# Patient Record
Sex: Female | Born: 1965
Health system: Southern US, Community
[De-identification: ages and names within clinical notes are randomized; demographics above are authoritative.]

## PROBLEM LIST (undated history)

## (undated) DIAGNOSIS — K219 Gastro-esophageal reflux disease without esophagitis: Secondary | ICD-10-CM

## (undated) DIAGNOSIS — F410 Panic disorder [episodic paroxysmal anxiety] without agoraphobia: Secondary | ICD-10-CM

## (undated) DIAGNOSIS — T7840XA Allergy, unspecified, initial encounter: Secondary | ICD-10-CM

## (undated) DIAGNOSIS — I10 Essential (primary) hypertension: Secondary | ICD-10-CM

## (undated) DIAGNOSIS — D801 Nonfamilial hypogammaglobulinemia: Secondary | ICD-10-CM

## (undated) DIAGNOSIS — N809 Endometriosis, unspecified: Secondary | ICD-10-CM

## (undated) DIAGNOSIS — E785 Hyperlipidemia, unspecified: Secondary | ICD-10-CM

## (undated) DIAGNOSIS — J329 Chronic sinusitis, unspecified: Secondary | ICD-10-CM

## (undated) DIAGNOSIS — F32A Depression, unspecified: Secondary | ICD-10-CM

## (undated) HISTORY — PX: NASAL SEPTUM SURGERY: SHX37

## (undated) HISTORY — PX: SINUSOTOMY: SHX291

## (undated) HISTORY — PX: KNEE SURGERY: SHX244

## (undated) HISTORY — DX: Depression, unspecified: F32.A

## (undated) HISTORY — PX: BLADDER SURGERY: SHX569

## (undated) HISTORY — DX: Allergy, unspecified, initial encounter: T78.40XA

## (undated) HISTORY — PX: ADENOIDECTOMY: SUR15

## (undated) HISTORY — PX: ABDOMINAL HYSTERECTOMY: SHX81

## (undated) HISTORY — DX: Gastro-esophageal reflux disease without esophagitis: K21.9

## (undated) HISTORY — PX: BREAST LUMPECTOMY: SHX2

## (undated) HISTORY — PX: TONSILLECTOMY: SUR1361

---

## 2006-05-13 ENCOUNTER — Encounter: Admission: RE | Admit: 2006-05-13 | Discharge: 2006-05-13 | Payer: Self-pay | Admitting: Family Medicine

## 2009-12-04 DIAGNOSIS — D801 Nonfamilial hypogammaglobulinemia: Secondary | ICD-10-CM | POA: Insufficient documentation

## 2009-12-04 DIAGNOSIS — E785 Hyperlipidemia, unspecified: Secondary | ICD-10-CM | POA: Insufficient documentation

## 2009-12-04 DIAGNOSIS — R5383 Other fatigue: Secondary | ICD-10-CM | POA: Insufficient documentation

## 2010-10-09 ENCOUNTER — Other Ambulatory Visit: Payer: Self-pay | Admitting: Family Medicine

## 2010-10-09 ENCOUNTER — Ambulatory Visit
Admission: RE | Admit: 2010-10-09 | Discharge: 2010-10-09 | Disposition: A | Discharge status: Home / Self Care | Source: Ambulatory Visit | Attending: Family Medicine | Admitting: Family Medicine

## 2010-10-09 DIAGNOSIS — T1490XA Injury, unspecified, initial encounter: Secondary | ICD-10-CM

## 2011-01-09 ENCOUNTER — Other Ambulatory Visit: Payer: Self-pay | Admitting: Family Medicine

## 2011-01-09 DIAGNOSIS — N6320 Unspecified lump in the left breast, unspecified quadrant: Secondary | ICD-10-CM

## 2011-01-17 ENCOUNTER — Encounter

## 2011-01-17 ENCOUNTER — Ambulatory Visit
Admission: RE | Admit: 2011-01-17 | Discharge: 2011-01-17 | Disposition: A | Discharge status: Home / Self Care | Source: Ambulatory Visit | Attending: Family Medicine | Admitting: Family Medicine

## 2011-01-17 ENCOUNTER — Other Ambulatory Visit: Payer: Self-pay | Admitting: Family Medicine

## 2011-01-17 DIAGNOSIS — N6320 Unspecified lump in the left breast, unspecified quadrant: Secondary | ICD-10-CM

## 2011-01-22 ENCOUNTER — Telehealth: Payer: Self-pay | Admitting: Family

## 2011-01-22 NOTE — Telephone Encounter (Signed)
called pt lmovm to rtn call to schedule appt for 02/03/2011

## 2011-01-24 ENCOUNTER — Other Ambulatory Visit: Payer: Self-pay | Admitting: Family Medicine

## 2011-01-24 ENCOUNTER — Telehealth: Payer: Self-pay | Admitting: Family

## 2011-01-24 DIAGNOSIS — Z1501 Genetic susceptibility to malignant neoplasm of breast: Secondary | ICD-10-CM

## 2011-01-24 DIAGNOSIS — Z1509 Genetic susceptibility to other malignant neoplasm: Secondary | ICD-10-CM

## 2011-01-24 DIAGNOSIS — Z803 Family history of malignant neoplasm of breast: Secondary | ICD-10-CM

## 2011-01-24 NOTE — Telephone Encounter (Signed)
called pt lmovm to rtn call to confirm appt in [id number]

## 2011-01-27 ENCOUNTER — Encounter

## 2011-01-27 ENCOUNTER — Other Ambulatory Visit

## 2011-01-28 ENCOUNTER — Telehealth: Payer: Self-pay | Admitting: Oncology

## 2011-01-28 NOTE — Telephone Encounter (Signed)
no response from pt will rtn chart to Misty Stanley to infom her that pt did not rtn call to scheduled appt

## 2011-02-03 ENCOUNTER — Encounter: Admitting: Family

## 2011-02-03 ENCOUNTER — Ambulatory Visit

## 2011-02-24 ENCOUNTER — Ambulatory Visit

## 2011-02-24 ENCOUNTER — Encounter: Admitting: Family

## 2011-02-27 ENCOUNTER — Telehealth: Payer: Self-pay | Admitting: *Deleted

## 2011-02-27 NOTE — Telephone Encounter (Signed)
Called pt to see if she wanted to get rescheduled for [facility] and she states that her insurance [employer] does not participate with [place] and she does not want to schedule an appt.

## 2011-03-11 DIAGNOSIS — Z1501 Genetic susceptibility to malignant neoplasm of breast: Secondary | ICD-10-CM | POA: Insufficient documentation

## 2011-04-17 ENCOUNTER — Encounter (INDEPENDENT_AMBULATORY_CARE_PROVIDER_SITE_OTHER): Payer: Self-pay

## 2012-11-08 DIAGNOSIS — R609 Edema, unspecified: Secondary | ICD-10-CM | POA: Insufficient documentation

## 2012-12-02 DIAGNOSIS — F17200 Nicotine dependence, unspecified, uncomplicated: Secondary | ICD-10-CM | POA: Insufficient documentation

## 2013-01-27 DIAGNOSIS — M791 Myalgia, unspecified site: Secondary | ICD-10-CM | POA: Insufficient documentation

## 2015-02-08 DIAGNOSIS — G609 Hereditary and idiopathic neuropathy, unspecified: Secondary | ICD-10-CM | POA: Insufficient documentation

## 2015-02-08 DIAGNOSIS — K219 Gastro-esophageal reflux disease without esophagitis: Secondary | ICD-10-CM | POA: Insufficient documentation

## 2015-02-08 DIAGNOSIS — F3341 Major depressive disorder, recurrent, in partial remission: Secondary | ICD-10-CM | POA: Insufficient documentation

## 2015-06-26 DIAGNOSIS — I10 Essential (primary) hypertension: Secondary | ICD-10-CM | POA: Insufficient documentation

## 2016-07-11 DIAGNOSIS — K219 Gastro-esophageal reflux disease without esophagitis: Secondary | ICD-10-CM | POA: Insufficient documentation

## 2016-07-11 DIAGNOSIS — K2970 Gastritis, unspecified, without bleeding: Secondary | ICD-10-CM | POA: Insufficient documentation

## 2016-07-11 DIAGNOSIS — F32A Depression, unspecified: Secondary | ICD-10-CM | POA: Insufficient documentation

## 2016-07-11 DIAGNOSIS — F411 Generalized anxiety disorder: Secondary | ICD-10-CM | POA: Insufficient documentation

## 2016-07-11 DIAGNOSIS — F329 Major depressive disorder, single episode, unspecified: Secondary | ICD-10-CM | POA: Insufficient documentation

## 2016-07-11 DIAGNOSIS — J301 Allergic rhinitis due to pollen: Secondary | ICD-10-CM | POA: Insufficient documentation

## 2016-07-11 DIAGNOSIS — E785 Hyperlipidemia, unspecified: Secondary | ICD-10-CM | POA: Insufficient documentation

## 2016-07-11 HISTORY — DX: Gastritis, unspecified, without bleeding: K29.70

## 2016-09-23 DIAGNOSIS — K2270 Barrett's esophagus without dysplasia: Secondary | ICD-10-CM | POA: Insufficient documentation

## 2016-09-23 DIAGNOSIS — H539 Unspecified visual disturbance: Secondary | ICD-10-CM | POA: Insufficient documentation

## 2016-09-23 DIAGNOSIS — G4700 Insomnia, unspecified: Secondary | ICD-10-CM | POA: Insufficient documentation

## 2016-09-23 DIAGNOSIS — G629 Polyneuropathy, unspecified: Secondary | ICD-10-CM | POA: Insufficient documentation

## 2016-10-06 DIAGNOSIS — D696 Thrombocytopenia, unspecified: Secondary | ICD-10-CM | POA: Insufficient documentation

## 2016-10-06 DIAGNOSIS — R0683 Snoring: Secondary | ICD-10-CM | POA: Insufficient documentation

## 2016-10-06 DIAGNOSIS — J329 Chronic sinusitis, unspecified: Secondary | ICD-10-CM | POA: Insufficient documentation

## 2016-10-06 DIAGNOSIS — Z8619 Personal history of other infectious and parasitic diseases: Secondary | ICD-10-CM | POA: Insufficient documentation

## 2016-10-09 DIAGNOSIS — R7989 Other specified abnormal findings of blood chemistry: Secondary | ICD-10-CM

## 2016-10-09 HISTORY — DX: Other specified abnormal findings of blood chemistry: R79.89

## 2018-02-05 DIAGNOSIS — F419 Anxiety disorder, unspecified: Secondary | ICD-10-CM | POA: Insufficient documentation

## 2018-02-05 DIAGNOSIS — F410 Panic disorder [episodic paroxysmal anxiety] without agoraphobia: Secondary | ICD-10-CM | POA: Insufficient documentation

## 2018-02-05 DIAGNOSIS — F411 Generalized anxiety disorder: Secondary | ICD-10-CM | POA: Insufficient documentation

## 2018-02-09 DIAGNOSIS — K1120 Sialoadenitis, unspecified: Secondary | ICD-10-CM | POA: Insufficient documentation

## 2018-02-09 HISTORY — DX: Sialoadenitis, unspecified: K11.20

## 2018-08-16 MED FILL — SERTRALINE HCL 100 MG TAB: 100 | 30 days supply | Qty: 60 | Fill #0

## 2018-09-09 ENCOUNTER — Ambulatory Visit
Admission: EM | Admit: 2018-09-09 | Discharge: 2018-09-09 | Disposition: A | Discharge status: Home / Self Care | Payer: No Typology Code available for payment source | Attending: Emergency Medicine | Admitting: Emergency Medicine

## 2018-09-09 ENCOUNTER — Encounter: Payer: Self-pay | Admitting: Emergency Medicine

## 2018-09-09 ENCOUNTER — Other Ambulatory Visit: Payer: Self-pay

## 2018-09-09 DIAGNOSIS — H9201 Otalgia, right ear: Secondary | ICD-10-CM | POA: Diagnosis not present

## 2018-09-09 DIAGNOSIS — R05 Cough: Secondary | ICD-10-CM

## 2018-09-09 DIAGNOSIS — Z20822 Contact with and (suspected) exposure to covid-19: Secondary | ICD-10-CM

## 2018-09-09 DIAGNOSIS — H6121 Impacted cerumen, right ear: Secondary | ICD-10-CM

## 2018-09-09 DIAGNOSIS — R5383 Other fatigue: Secondary | ICD-10-CM | POA: Diagnosis not present

## 2018-09-09 DIAGNOSIS — J0101 Acute recurrent maxillary sinusitis: Secondary | ICD-10-CM

## 2018-09-09 DIAGNOSIS — Z20828 Contact with and (suspected) exposure to other viral communicable diseases: Secondary | ICD-10-CM

## 2018-09-09 DIAGNOSIS — R509 Fever, unspecified: Secondary | ICD-10-CM

## 2018-09-09 HISTORY — DX: Chronic sinusitis, unspecified: J32.9

## 2018-09-09 HISTORY — DX: Hyperlipidemia, unspecified: E78.5

## 2018-09-09 HISTORY — DX: Essential (primary) hypertension: I10

## 2018-09-09 HISTORY — DX: Endometriosis, unspecified: N80.9

## 2018-09-09 HISTORY — DX: Panic disorder (episodic paroxysmal anxiety): F41.0

## 2018-09-09 HISTORY — DX: Nonfamilial hypogammaglobulinemia: D80.1

## 2018-09-09 MED ORDER — DOXYCYCLINE HYCLATE 100 MG PO CAPS: 100.0000 mg | ORAL_CAPSULE | Freq: Two times a day (BID) | ORAL | 0 refills | Status: DC

## 2018-09-09 NOTE — Discharge Instructions (Addendum)
Right ear lavage performed in office  COVID testing ordered.  It will take 5-7 days for test results to return.  We will follow up with you regarding abnormal results  In the meantime: You should remain isolated in your home for 10 days from symptom onset AND greater than 72 hours after symptoms resolution (absence of fever without the use of fever-reducing medication and improvement in respiratory symptoms), whichever is longer Get plenty of rest and push fluids Based on symptoms will go ahead and treat for possible sinus infection.  Augmentin prescribed (paper prescription given, fill in 1-2 days if symptoms do not improve).  Take as directed and to completion Use OTC zyrtec for nasal congestion, runny nose, and/or sore throat Use OTC flonase for nasal congestion and runny nose Use medications daily for symptom relief Use OTC medications like ibuprofen or tylenol as needed fever or pain Call or go to the ED if you have any new or worsening symptoms such as fever, worsening cough, shortness of breath, chest tightness, chest pain, turning blue, changes in mental status, etc..Marland Kitchen

## 2018-09-09 NOTE — ED Triage Notes (Signed)
Pt presents with complaints of sinus drainage, right earache and chronic cough. Pt states that she has hx of chronic sinus issues and is not concerned for covid.

## 2018-09-09 NOTE — ED Provider Notes (Signed)
[place]   [id number] 09/09/18 Arrival Time: 7001   CC: Sinus drainage   SUBJECTIVE: History from: patient.  Pamela Waller is a 53 y.o. female who presents with acute on chronic sinus congestion/ pain as well as RT earache that began 3 days ago. Denies sick exposure to COVID, flu or strep.  Denies recent travel.   Does work at [employer], but denies positive COVID exposure.  Has tried OTC medications without relief.  Symptoms are made worse with leaning forward.  Reports previous symptoms in the past with chronic sinus issues. Reports past psuedomonas nasal infection and required IV antibiotics.  Complains of associated fatigue, temp of 99, PND, decreased hearing, mild productive cough, and improving vertigo.  Denies fever, chills, rhinorrhea, sore throat, SOB, wheezing, chest pain, nausea, changes in bowel or bladder habits.    ROS: As per HPI.  All other pertinent ROS negative.     Past Medical History:  Diagnosis Date  . Endometriosis   . Hyperlipidemia   . Hypertension   . Hypogammaglobulinemia ([place])    sub class 1 and 3 deficiency  . Panic attacks   . Sinusitis    Past Surgical History:  Procedure Laterality Date  . ABDOMINAL HYSTERECTOMY    . BLADDER SURGERY    . BREAST LUMPECTOMY    . CESAREAN SECTION    . KNEE SURGERY    . NASAL SEPTUM SURGERY    . SINUSOTOMY     Allergies  Allergen Reactions  . Dilaudid [Hydromorphone Hcl] Anaphylaxis  . Morphine And Related Anaphylaxis  . Codeine   . Penicillins   . Prednisone     psychosis  . Prozac [Fluoxetine Hcl]    No current facility-administered medications on file prior to encounter.    Current Outpatient Medications on File Prior to Encounter  Medication Sig Dispense Refill  . aspirin 81 MG chewable tablet Chew by mouth daily.    . cholecalciferol (VITAMIN D3) 25 MCG (1000 UT) tablet Take 2,000 Units by mouth daily.    . clonazePAM (KLONOPIN) 0.5 MG tablet Take 0.5 mg by mouth 3 (three) times  daily as needed for anxiety.    . rosuvastatin (CRESTOR) 20 MG tablet Take 20 mg by mouth daily.    . sertraline (ZOLOFT) 100 MG tablet Take 200 mg by mouth daily.     Social History   Socioeconomic History  . Marital status: Widowed    Spouse name: Not on file  . Number of children: Not on file  . Years of education: Not on file  . Highest education level: Not on file  Occupational History  . Not on file  Social Needs  . Financial resource strain: Not on file  . Food insecurity    Worry: Not on file    Inability: Not on file  . Transportation needs    Medical: Not on file    Non-medical: Not on file  Tobacco Use  . Smoking status: Current Every Day Smoker    Packs/day: 0.50  . Smokeless tobacco: Never Used  Substance and Sexual Activity  . Alcohol use: Never    Frequency: Never  . Drug use: Never  . Sexual activity: Not on file  Lifestyle  . Physical activity    Days per week: Not on file    Minutes per session: Not on file  . Stress: Not on file  Relationships  . Social Herbalist on phone: Not on file    Gets  together: Not on file    Attends religious service: Not on file    Active member of club or organization: Not on file    Attends meetings of clubs or organizations: Not on file    Relationship status: Not on file  . Intimate partner violence    Fear of current or ex partner: Not on file    Emotionally abused: Not on file    Physically abused: Not on file    Forced sexual activity: Not on file  Other Topics Concern  . Not on file  Social History Narrative  . Not on file   Family History  Problem Relation Age of Onset  . Asthma Mother   . Arthritis Mother   . Pulmonary fibrosis Father   . Hyperlipidemia Father   . Hypertension Father     OBJECTIVE:  Vitals:   09/09/18 1304  BP: 120/81  Pulse: 90  Resp: 18  Temp: 98.3 F (36.8 C)  SpO2: 95%     General appearance: alert; appears mildly fatigued, but nontoxic; speaking in full  sentences and tolerating own secretions HEENT: NCAT; Ears: LT EAC clear, LT TM pearly gray, RT EAC with cerumen impaction, TM not visualized; Eyes: PERRL.  EOM grossly intact. Sinuses: TTP over RT maxillary sinus; Nose: nares patent without rhinorrhea, Throat: oropharynx clear, tonsils non erythematous or enlarged, uvula midline  Neck: supple without LAD Lungs: unlabored respirations, symmetrical air entry; cough: absent; no respiratory distress; CTAB Heart: regular rate and rhythm.  Radial pulses 2+ symmetrical bilaterally Skin: warm and dry Psychological: alert and cooperative; normal mood and affect  PROCEDURE:  Consent granted.  Right ear lavage performed by Monna Fam RT.  TM visualized.  PT tolerated procedure well.    ASSESSMENT & PLAN:  1. Suspected Covid-19 Virus Infection   2. Impacted cerumen of right ear   3. Acute otalgia, right   4. Acute recurrent maxillary sinusitis     Meds ordered this encounter  Medications  . DISCONTD: doxycycline (VIBRAMYCIN) 100 MG capsule    Sig: Take 1 capsule (100 mg total) by mouth 2 (two) times daily.    Dispense:  20 capsule    Refill:  0    Order Specific Question:   Supervising Provider    Answer:   Raylene Everts [[id number]]  . DISCONTD: doxycycline (VIBRAMYCIN) 100 MG capsule    Sig: Take 1 capsule (100 mg total) by mouth 2 (two) times daily.    Dispense:  20 capsule    Refill:  0    Order Specific Question:   Supervising Provider    Answer:   Raylene Everts [[id number]]  . DISCONTD: doxycycline (VIBRAMYCIN) 100 MG capsule    Sig: Take 1 capsule (100 mg total) by mouth 2 (two) times daily.    Dispense:  20 capsule    Refill:  0    Order Specific Question:   Supervising Provider    Answer:   Raylene Everts [[id number]]  . doxycycline (VIBRAMYCIN) 100 MG capsule    Sig: Take 1 capsule (100 mg total) by mouth 2 (two) times daily.    Dispense:  20 capsule    Refill:  0    Order Specific Question:   Supervising Provider     Answer:   Raylene Everts [[id number]]   Right ear lavage performed in office  COVID testing ordered.  It will take 5-7 days for test results to return.  We will follow up with you regarding abnormal  results  In the meantime: You should remain isolated in your home for 10 days from symptom onset AND greater than 72 hours after symptoms resolution (absence of fever without the use of fever-reducing medication and improvement in respiratory symptoms), whichever is longer Get plenty of rest and push fluids Based on symptoms will go ahead and treat for possible sinus infection.  Doxycycline prescribed.  Take as directed and to completion Use OTC zyrtec for nasal congestion, runny nose, and/or sore throat Use OTC flonase for nasal congestion and runny nose Use medications daily for symptom relief Use OTC medications like ibuprofen or tylenol as needed fever or pain Call or go to the ED if you have any new or worsening symptoms such as fever, worsening cough, shortness of breath, chest tightness, chest pain, turning blue, changes in mental status, etc...    Reviewed expectations re: course of current medical issues. Questions answered. Outlined signs and symptoms indicating need for more acute intervention. Patient verbalized understanding. After Visit Summary given.         Lestine Box, PA-C 09/09/18 1349

## 2018-09-10 LAB — NOVEL CORONAVIRUS, NAA: SARS-CoV-2, NAA: NOT DETECTED

## 2018-09-13 ENCOUNTER — Telehealth (HOSPITAL_COMMUNITY): Payer: Self-pay | Admitting: Emergency Medicine

## 2018-09-13 MED FILL — SERTRALINE HCL 100 MG TAB: 100 | 30 days supply | Qty: 60 | Fill #1

## 2018-09-13 NOTE — Telephone Encounter (Signed)
Patient contacted and made aware of  covid  results, all questions answered

## 2018-09-14 MED FILL — clonazePAM 0.5 MG TABS: 0.5 | 30 days supply | Qty: 90 | Fill #0

## 2018-09-30 ENCOUNTER — Ambulatory Visit (INDEPENDENT_AMBULATORY_CARE_PROVIDER_SITE_OTHER): Admitting: Internal Medicine

## 2018-10-11 MED FILL — SERTRALINE HCL 100 MG TAB: 100 | 90 days supply | Qty: 180 | Fill #0

## 2018-10-27 MED FILL — clonazePAM 0.5 MG TABS: 0.5 | 30 days supply | Qty: 90 | Fill #1

## 2018-12-02 ENCOUNTER — Telehealth: Payer: Self-pay | Admitting: Emergency Medicine

## 2018-12-02 ENCOUNTER — Encounter: Payer: Self-pay | Admitting: Internal Medicine

## 2018-12-02 NOTE — Telephone Encounter (Signed)
Ok with me 

## 2018-12-02 NOTE — Telephone Encounter (Signed)
Pt is wanting to know if she can establish care with you. She needs an MD and lots of referrals. Are you willing to except her?

## 2018-12-06 NOTE — Telephone Encounter (Signed)
Left pt vm to call back to schedule appt

## 2018-12-10 MED FILL — clonazePAM 0.5 MG TABS: 0.5 | 30 days supply | Qty: 90 | Fill #2

## 2018-12-20 ENCOUNTER — Ambulatory Visit: Payer: No Typology Code available for payment source | Admitting: Internal Medicine

## 2018-12-23 ENCOUNTER — Ambulatory Visit (INDEPENDENT_AMBULATORY_CARE_PROVIDER_SITE_OTHER): Payer: No Typology Code available for payment source | Admitting: Internal Medicine

## 2018-12-23 ENCOUNTER — Other Ambulatory Visit (INDEPENDENT_AMBULATORY_CARE_PROVIDER_SITE_OTHER): Payer: No Typology Code available for payment source

## 2018-12-23 ENCOUNTER — Encounter: Payer: Self-pay | Admitting: Internal Medicine

## 2018-12-23 ENCOUNTER — Other Ambulatory Visit: Payer: Self-pay

## 2018-12-23 VITALS — BP 132/86 | HR 72 | Temp 97.8°F | Wt 115.0 lb

## 2018-12-23 DIAGNOSIS — D801 Nonfamilial hypogammaglobulinemia: Secondary | ICD-10-CM

## 2018-12-23 DIAGNOSIS — R739 Hyperglycemia, unspecified: Secondary | ICD-10-CM

## 2018-12-23 DIAGNOSIS — E538 Deficiency of other specified B group vitamins: Secondary | ICD-10-CM | POA: Diagnosis not present

## 2018-12-23 DIAGNOSIS — K219 Gastro-esophageal reflux disease without esophagitis: Secondary | ICD-10-CM

## 2018-12-23 DIAGNOSIS — E559 Vitamin D deficiency, unspecified: Secondary | ICD-10-CM

## 2018-12-23 DIAGNOSIS — Z0001 Encounter for general adult medical examination with abnormal findings: Secondary | ICD-10-CM

## 2018-12-23 DIAGNOSIS — I10 Essential (primary) hypertension: Secondary | ICD-10-CM

## 2018-12-23 DIAGNOSIS — Z01419 Encounter for gynecological examination (general) (routine) without abnormal findings: Secondary | ICD-10-CM | POA: Insufficient documentation

## 2018-12-23 DIAGNOSIS — J329 Chronic sinusitis, unspecified: Secondary | ICD-10-CM

## 2018-12-23 DIAGNOSIS — E611 Iron deficiency: Secondary | ICD-10-CM

## 2018-12-23 DIAGNOSIS — J309 Allergic rhinitis, unspecified: Secondary | ICD-10-CM | POA: Insufficient documentation

## 2018-12-23 DIAGNOSIS — E785 Hyperlipidemia, unspecified: Secondary | ICD-10-CM

## 2018-12-23 DIAGNOSIS — G4710 Hypersomnia, unspecified: Secondary | ICD-10-CM

## 2018-12-23 DIAGNOSIS — F410 Panic disorder [episodic paroxysmal anxiety] without agoraphobia: Secondary | ICD-10-CM | POA: Insufficient documentation

## 2018-12-23 HISTORY — DX: Chronic sinusitis, unspecified: J32.9

## 2018-12-23 HISTORY — DX: Deficiency of other specified B group vitamins: E53.8

## 2018-12-23 HISTORY — DX: Vitamin D deficiency, unspecified: E55.9

## 2018-12-23 LAB — CBC WITH DIFFERENTIAL/PLATELET
Basophils Absolute: 0.1 10*3/uL (ref 0.0–0.1)
Basophils Relative: 1.1 % (ref 0.0–3.0)
Eosinophils Absolute: 0.3 10*3/uL (ref 0.0–0.7)
Eosinophils Relative: 4.5 % (ref 0.0–5.0)
HCT: 41.2 % (ref 36.0–46.0)
Hemoglobin: 13.7 g/dL (ref 12.0–15.0)
Lymphocytes Relative: 32.2 % (ref 12.0–46.0)
Lymphs Abs: 2.1 10*3/uL (ref 0.7–4.0)
MCHC: 33.4 g/dL (ref 30.0–36.0)
MCV: 90.1 fl (ref 78.0–100.0)
Monocytes Absolute: 0.4 10*3/uL (ref 0.1–1.0)
Monocytes Relative: 6.7 % (ref 3.0–12.0)
Neutro Abs: 3.6 10*3/uL (ref 1.4–7.7)
Neutrophils Relative %: 55.5 % (ref 43.0–77.0)
Platelets: 111 10*3/uL — ABNORMAL LOW (ref 150.0–400.0)
RBC: 4.57 Mil/uL (ref 3.87–5.11)
RDW: 13.2 % (ref 11.5–15.5)
WBC: 6.4 10*3/uL (ref 4.0–10.5)

## 2018-12-23 LAB — URINALYSIS, ROUTINE W REFLEX MICROSCOPIC
Bilirubin Urine: NEGATIVE
Ketones, ur: NEGATIVE
Leukocytes,Ua: NEGATIVE
Nitrite: NEGATIVE
Specific Gravity, Urine: 1.025 (ref 1.000–1.030)
Total Protein, Urine: NEGATIVE
Urine Glucose: NEGATIVE
Urobilinogen, UA: 0.2 (ref 0.0–1.0)
pH: 6 (ref 5.0–8.0)

## 2018-12-23 LAB — HEMOGLOBIN A1C: Hgb A1c MFr Bld: 5.1 % (ref 4.6–6.5)

## 2018-12-23 LAB — BASIC METABOLIC PANEL
BUN: 14 mg/dL (ref 6–23)
CO2: 28 mEq/L (ref 19–32)
Calcium: 9.8 mg/dL (ref 8.4–10.5)
Chloride: 106 mEq/L (ref 96–112)
Creatinine, Ser: 0.75 mg/dL (ref 0.40–1.20)
GFR: 80.68 mL/min (ref >60.00)
Glucose, Bld: 83 mg/dL (ref 70–99)
Potassium: 3.9 mEq/L (ref 3.5–5.1)
Sodium: 141 mEq/L (ref 135–145)

## 2018-12-23 LAB — IBC PANEL
Iron: 79 ug/dL (ref 42–145)
Saturation Ratios: 20.8 % (ref 20.0–50.0)
Transferrin: 271 mg/dL (ref 212.0–360.0)

## 2018-12-23 LAB — LIPID PANEL
Cholesterol: 186 mg/dL (ref 0–200)
HDL: 43 mg/dL (ref >39.00)
LDL Cholesterol: 105 mg/dL — ABNORMAL HIGH (ref 0–99)
NonHDL: 142.67
Total CHOL/HDL Ratio: 4
Triglycerides: 186 mg/dL — ABNORMAL HIGH (ref 0.0–149.0)
VLDL: 37.2 mg/dL (ref 0.0–40.0)

## 2018-12-23 LAB — HEPATIC FUNCTION PANEL
ALT: 13 U/L (ref 0–35)
AST: 17 U/L (ref 0–37)
Albumin: 4.7 g/dL (ref 3.5–5.2)
Alkaline Phosphatase: 76 U/L (ref 39–117)
Bilirubin, Direct: 0.1 mg/dL (ref 0.0–0.3)
Total Bilirubin: 0.5 mg/dL (ref 0.2–1.2)
Total Protein: 6.9 g/dL (ref 6.0–8.3)

## 2018-12-23 LAB — TSH: TSH: 2.79 u[IU]/mL (ref 0.35–4.50)

## 2018-12-23 LAB — VITAMIN B12: Vitamin B-12: 180 pg/mL — ABNORMAL LOW (ref 211–911)

## 2018-12-23 LAB — VITAMIN D 25 HYDROXY (VIT D DEFICIENCY, FRACTURES): VITD: 33.07 ng/mL (ref 30.00–100.00)

## 2018-12-23 MED ORDER — VITAMIN B-12 1000 MCG PO TABS: 1000.0000 ug | ORAL_TABLET | Freq: Every day | ORAL | 3 refills | Status: DC

## 2018-12-23 MED ORDER — PANTOPRAZOLE SODIUM 40 MG PO TBEC: 40.0000 mg | DELAYED_RELEASE_TABLET | Freq: Every day | ORAL | 3 refills | Status: DC

## 2018-12-23 MED ORDER — PROPRANOLOL HCL ER 60 MG PO CP24: 60.0000 mg | ORAL_CAPSULE | Freq: Every day | ORAL | 3 refills | Status: DC

## 2018-12-23 MED ORDER — HYDROCHLOROTHIAZIDE 12.5 MG PO CAPS: 12.5000 mg | ORAL_CAPSULE | Freq: Every day | ORAL | 3 refills | Status: DC

## 2018-12-23 MED FILL — PANTOPRAZOLE SOD DR 40 MG T: 40 | 90 days supply | Qty: 90 | Fill #0

## 2018-12-23 MED FILL — HYDROCHLOROTHIAZIDE 12.5 MG: 12.5 | 90 days supply | Qty: 90 | Fill #0

## 2018-12-23 MED FILL — PROPRANOLOL HCL ER 60 MG CP: 60 | 90 days supply | Qty: 90 | Fill #0

## 2018-12-23 NOTE — Patient Instructions (Signed)
Please take all new medication as prescribed - the protonix   Please continue all other medications as before, including the restart of the inderal and HCT  Please have the pharmacy call with any other refills you may need.  Please continue your efforts at being more active, low cholesterol diet, and weight control.  You are otherwise up to date with prevention measures today.  Please keep your appointments with your specialists as you may have planned  You will be contacted regarding the referral for: ENT, GI, Pulmonary, and Infectious Disease  Please go to the LAB in the Basement (turn left off the elevator) for the tests to be done today  You will be contacted by phone if any changes need to be made immediately.  Otherwise, you will receive a letter about your results with an explanation, but please check with MyChart first.  Please remember to sign up for MyChart if you have not done so, as this will be important to you in the future with finding out test results, communicating by private email, and scheduling acute appointments online when needed.  Please return in 1 year for your yearly visit, or sooner if needed

## 2018-12-23 NOTE — Progress Notes (Signed)
Subjective:    Patient ID: Pamela Waller, female    DOB: Feb 22, 1965, 53 y.o.   MRN: [id number]  HPI Here for wellness and f/u;  Overall doing ok;  Pt denies Chest pain, worsening SOB, DOE, wheezing, orthopnea, PND, worsening LE edema, palpitations, dizziness or syncope.  Pt denies neurological change such as new headache, facial or extremity weakness.  Pt denies polydipsia, polyuria, or low sugar symptoms. Pt states overall good compliance with treatment and medications, good tolerability, and has been trying to follow appropriate diet.  Pt denies worsening depressive symptoms, suicidal ideation or panic. No fever, night sweats, wt loss, loss of appetite, or other constitutional symptoms.  Pt states good ability with ADL's, has low fall risk, home safety reviewed and adequate, no other significant changes in hearing or vision, and occasionally active with exercise.  Here with 2-3 yrs chronic onset intermittent fever, facial pain, pressure, headache, general weakness and malaise, and greenish d/c, with mild ST and cough.  Has been a recurrent problem requiring ENT in past.  Also, Has had mild to mod worsening reflux, but no abd pain, dysphagia, n/v, bowel change or blood, asks for referral GI.  Also with c/o daytime somnolence most days.  Also with hx of low b12, low vit d, and hypogammaglobulinemia needing referral as she has just moved her for new job.  Also needs restart meds for HTN now run out.   Past Medical History:  Diagnosis Date  . Chronic sinusitis 12/23/2018  . Endometriosis   . Hyperlipidemia   . Hypertension   . Hypogammaglobulinemia ([place])    sub class 1 and 3 deficiency  . Panic attacks   . Sinusitis   . Vitamin B12 deficiency 12/23/2018  . Vitamin D deficiency 12/23/2018   Past Surgical History:  Procedure Laterality Date  . ABDOMINAL HYSTERECTOMY    . BLADDER SURGERY    . BREAST LUMPECTOMY    . CESAREAN SECTION    . KNEE SURGERY    . NASAL SEPTUM SURGERY    . SINUSOTOMY       reports that she has been smoking. She has been smoking about 0.50 packs per day. She has never used smokeless tobacco. She reports that she does not drink alcohol or use drugs. family history includes Arthritis in her mother; Asthma in her mother; Hyperlipidemia in her father; Hypertension in her father; Pulmonary fibrosis in her father. Allergies  Allergen Reactions  . Dilaudid [Hydromorphone Hcl] Anaphylaxis  . Morphine And Related Anaphylaxis  . Codeine   . Penicillins   . Prednisone     psychosis  . Prozac [Fluoxetine Hcl]    Current Outpatient Medications on File Prior to Visit  Medication Sig Dispense Refill  . aspirin 81 MG chewable tablet Chew by mouth daily.    . cholecalciferol (VITAMIN D3) 25 MCG (1000 UT) tablet Take 2,000 Units by mouth daily.    . clonazePAM (KLONOPIN) 0.5 MG tablet Take 0.5 mg by mouth 3 (three) times daily as needed for anxiety.    . rosuvastatin (CRESTOR) 20 MG tablet Take 20 mg by mouth daily.    . sertraline (ZOLOFT) 100 MG tablet Take 200 mg by mouth daily.     No current facility-administered medications on file prior to visit.    Review of Systems Constitutional: Negative for other unusual diaphoresis, sweats, appetite or weight changes HENT: Negative for other worsening hearing loss, ear pain, facial swelling, mouth sores or neck stiffness.   Eyes: Negative for other worsening  pain, redness or other visual disturbance.  Respiratory: Negative for other stridor or swelling Cardiovascular: Negative for other palpitations or other chest pain  Gastrointestinal: Negative for worsening diarrhea or loose stools, blood in stool, distention or other pain Genitourinary: Negative for hematuria, flank pain or other change in urine volume.  Musculoskeletal: Negative for myalgias or other joint swelling.  Skin: Negative for other color change, or other wound or worsening drainage.  Neurological: Negative for other syncope or numbness. Hematological:  Negative for other adenopathy or swelling Psychiatric/Behavioral: Negative for hallucinations, other worsening agitation, SI, self-injury, or new decreased concentration All otherwise neg per pt     Objective:   Physical Exam BP 132/86   Pulse 72   Temp 97.8 F (36.6 C) (Oral)   Wt 115 lb (52.2 kg)   SpO2 93%  VS noted,  Constitutional: Pt is oriented to person, place, and time. Appears well-developed and well-nourished, in no significant distress and comfortable Head: Normocephalic and atraumatic  Eyes: Conjunctivae and EOM are normal. Pupils are equal, round, and reactive to light Right Ear: External ear normal without discharge Left Ear: External ear normal without discharge Nose: Nose without discharge or deformity Mouth/Throat: Oropharynx is without other ulcerations and moist  Neck: Normal range of motion. Neck supple. No JVD present. No tracheal deviation present or significant neck LA or mass Cardiovascular: Normal rate, regular rhythm, normal heart sounds and intact distal pulses.   Pulmonary/Chest: WOB normal and breath sounds without rales or wheezing  Abdominal: Soft. Bowel sounds are normal. NT. No HSM  Musculoskeletal: Normal range of motion. Exhibits no edema Lymphadenopathy: Has no other cervical adenopathy.  Neurological: Pt is alert and oriented to person, place, and time. Pt has normal reflexes. No cranial nerve deficit. Motor grossly intact, Gait intact Skin: Skin is warm and dry. No rash noted or new ulcerations Psychiatric:  Has normal mood and affect. Behavior is normal without agitation All otherwise neg per pt Lab Results  Component Value Date   WBC 6.4 12/23/2018   HGB 13.7 12/23/2018   HCT 41.2 12/23/2018   PLT 111.0 (L) 12/23/2018   GLUCOSE 83 12/23/2018   CHOL 186 12/23/2018   TRIG 186.0 (H) 12/23/2018   HDL 43.00 12/23/2018   LDLCALC 105 (H) 12/23/2018   ALT 13 12/23/2018   AST 17 12/23/2018   NA 141 12/23/2018   K 3.9 12/23/2018   CL 106  12/23/2018   CREATININE 0.75 12/23/2018   BUN 14 12/23/2018   CO2 28 12/23/2018   TSH 2.79 12/23/2018   HGBA1C 5.1 12/23/2018         Assessment & Plan:

## 2018-12-24 ENCOUNTER — Encounter: Payer: Self-pay | Admitting: Internal Medicine

## 2018-12-24 LAB — IGG, IGA, IGM
IgG (Immunoglobin G), Serum: 523 mg/dL — ABNORMAL LOW (ref 600–1640)
IgM, Serum: 79 mg/dL (ref 50–300)
Immunoglobulin A: 112 mg/dL (ref 47–310)

## 2018-12-25 ENCOUNTER — Encounter: Payer: Self-pay | Admitting: Internal Medicine

## 2018-12-25 DIAGNOSIS — G4710 Hypersomnia, unspecified: Secondary | ICD-10-CM | POA: Insufficient documentation

## 2018-12-25 NOTE — Assessment & Plan Note (Signed)
For quant Immuoglobulins, refer ID

## 2018-12-25 NOTE — Assessment & Plan Note (Signed)
For oral med refill,  to f/u any worsening symptoms or concerns

## 2018-12-25 NOTE — Assessment & Plan Note (Signed)
For f/u b12 level 

## 2018-12-25 NOTE — Assessment & Plan Note (Signed)
For protonix, and GI referral

## 2018-12-25 NOTE — Assessment & Plan Note (Signed)
Goal ldl < 100, fo rlipids

## 2018-12-25 NOTE — Assessment & Plan Note (Signed)

## 2018-12-25 NOTE — Assessment & Plan Note (Signed)
Also for pulm referral, to f/u any worsening symptoms or concerns

## 2018-12-25 NOTE — Assessment & Plan Note (Addendum)
With recent flare, for ct sinus, refer ENT  Note:  Total time for pt hx, exam, review of record with pt in the room, determination of diagnoses and plan for further eval and tx is > 30 min, with over 50% spent in coordination and counseling of patient including the differential dx, tx, further evaluation and other management of  Hypogammaglobulinemia, chronic sinusitis, vit b12 def, HLD, gerd, HTN, vit d def, and hypersomnolence

## 2018-12-25 NOTE — Assessment & Plan Note (Signed)
For oral replacement 

## 2018-12-27 ENCOUNTER — Other Ambulatory Visit: Payer: Self-pay

## 2018-12-27 ENCOUNTER — Ambulatory Visit
Admission: EM | Admit: 2018-12-27 | Discharge: 2018-12-27 | Disposition: A | Discharge status: Home / Self Care | Payer: No Typology Code available for payment source | Attending: Emergency Medicine | Admitting: Emergency Medicine

## 2018-12-27 DIAGNOSIS — Z20828 Contact with and (suspected) exposure to other viral communicable diseases: Secondary | ICD-10-CM

## 2018-12-27 DIAGNOSIS — Z20822 Contact with and (suspected) exposure to covid-19: Secondary | ICD-10-CM

## 2018-12-27 LAB — POC SARS CORONAVIRUS 2 AG -  ED: SARS Coronavirus 2 Ag: NEGATIVE

## 2018-12-27 NOTE — Discharge Instructions (Signed)
Rapid COVID negative.  Culture sent.  Patient should remain in quarantine until they have received culture results.  If negative you may resume normal activities (go back to work/school) while practicing hand hygiene, social distance, and mask wearing.  If positive, patient should remain in quarantine for 10 days from symptom onset AND greater than 72 hours after symptoms resolution (absence of fever without the use of fever-reducing medication and improvement in respiratory symptoms), whichever is longer Get plenty of rest and push fluids Use OTC zyrtec for nasal congestion, runny nose, and/or sore throat Use OTC flonase for nasal congestion and runny nose Use medications daily for symptom relief Use OTC medications like ibuprofen or tylenol as needed fever or pain Call or go to the ED if you have any new or worsening symptoms such as fever, cough, shortness of breath, chest tightness, chest pain, turning blue, changes in mental status, etc..Marland Kitchen

## 2018-12-27 NOTE — ED Triage Notes (Addendum)
Pt presents to [facility] stating she needs a covid test for travel. Pt denies any symptoms at this time. Pt requests a rapid test. Pt informed that if the rapid test is negative, we will still have to do the send out test. Pt states that the airlines only need the rapid test result. Pt understands that it could still be a false positive.

## 2018-12-27 NOTE — ED Provider Notes (Signed)
Pamela Waller   [id number] 12/27/18 Arrival Time: 2993   CC: COVID test  SUBJECTIVE: History from: patient.  Pamela Waller is a 53 y.o. female who presents for COVID testing.  Denies sick exposure to COVID, flu or strep.  Denies recent travel.  Needs rapid COVID test for travel to [place].  Denies aggravating or alleviating symptoms.  Denies previous COVID infection.   Denies fever, chills, fatigue, nasal congestion, rhinorrhea, sore throat, cough, SOB, wheezing, chest pain, nausea, vomiting, changes in bowel or bladder habits.    ROS: As per HPI.  All other pertinent ROS negative.     Past Medical History:  Diagnosis Date  . Chronic sinusitis 12/23/2018  . Endometriosis   . Hyperlipidemia   . Hypertension   . Hypogammaglobulinemia ([place])    sub class 1 and 3 deficiency  . Panic attacks   . Sinusitis   . Vitamin B12 deficiency 12/23/2018  . Vitamin D deficiency 12/23/2018   Past Surgical History:  Procedure Laterality Date  . ABDOMINAL HYSTERECTOMY    . BLADDER SURGERY    . BREAST LUMPECTOMY    . CESAREAN SECTION    . KNEE SURGERY    . NASAL SEPTUM SURGERY    . SINUSOTOMY     Allergies  Allergen Reactions  . Dilaudid [Hydromorphone Hcl] Anaphylaxis  . Morphine And Related Anaphylaxis  . Codeine   . Penicillins   . Prednisone     psychosis  . Prozac [Fluoxetine Hcl]    No current facility-administered medications on file prior to encounter.    Current Outpatient Medications on File Prior to Encounter  Medication Sig Dispense Refill  . aspirin 81 MG chewable tablet Chew by mouth daily.    . cholecalciferol (VITAMIN D3) 25 MCG (1000 UT) tablet Take 2,000 Units by mouth daily.    . clonazePAM (KLONOPIN) 0.5 MG tablet Take 0.5 mg by mouth 3 (three) times daily as needed for anxiety.    . hydrochlorothiazide (MICROZIDE) 12.5 MG capsule Take 1 capsule (12.5 mg total) by mouth daily. 90 capsule 3  . pantoprazole (PROTONIX) 40 MG tablet Take 1 tablet (40 mg total) by  mouth daily. 90 tablet 3  . propranolol ER (INDERAL LA) 60 MG 24 hr capsule Take 1 capsule (60 mg total) by mouth daily. 90 capsule 3  . rosuvastatin (CRESTOR) 20 MG tablet Take 20 mg by mouth daily.    . sertraline (ZOLOFT) 100 MG tablet Take 200 mg by mouth daily.    . vitamin B-12 (CYANOCOBALAMIN) 1000 MCG tablet Take 1 tablet (1,000 mcg total) by mouth daily. 90 tablet 3   Social History   Socioeconomic History  . Marital status: Widowed    Spouse name: Not on file  . Number of children: Not on file  . Years of education: Not on file  . Highest education level: Not on file  Occupational History  . Not on file  Social Needs  . Financial resource strain: Not on file  . Food insecurity    Worry: Not on file    Inability: Not on file  . Transportation needs    Medical: Not on file    Non-medical: Not on file  Tobacco Use  . Smoking status: Current Every Day Smoker    Packs/day: 0.50  . Smokeless tobacco: Never Used  Substance and Sexual Activity  . Alcohol use: Never    Frequency: Never  . Drug use: Never  . Sexual activity: Not on file  Lifestyle  .  Physical activity    Days per week: Not on file    Minutes per session: Not on file  . Stress: Not on file  Relationships  . Social Herbalist on phone: Not on file    Gets together: Not on file    Attends religious service: Not on file    Active member of club or organization: Not on file    Attends meetings of clubs or organizations: Not on file    Relationship status: Not on file  . Intimate partner violence    Fear of current or ex partner: Not on file    Emotionally abused: Not on file    Physically abused: Not on file    Forced sexual activity: Not on file  Other Topics Concern  . Not on file  Social History Narrative  . Not on file   Family History  Problem Relation Age of Onset  . Asthma Mother   . Arthritis Mother   . Pulmonary fibrosis Father   . Hyperlipidemia Father   . Hypertension  Father     OBJECTIVE:  There were no vitals filed for this visit.   General appearance: alert; well-appearing, nontoxic; speaking in full sentences and tolerating own secretions HEENT: NCAT; Ears: EACs clear, TMs pearly gray; Eyes: PERRL.  EOM grossly intact. Nose: nares patent without rhinorrhea, Throat: oropharynx clear, tonsils non erythematous or enlarged, uvula midline  Neck: supple without LAD Lungs: unlabored respirations, symmetrical air entry; cough: absent; no respiratory distress; CTAB Heart: regular rate and rhythm.  Skin: warm and dry Psychological: alert and cooperative; normal mood and affect  LABS:  Results for orders placed or performed during the hospital encounter of 12/27/18 (from the past 24 hour(s))  POC SARS Coronavirus 2 Ag-ED - Nasal Swab (BD Veritor Kit)     Status: None   Collection Time: 12/27/18  6:23 PM  Result Value Ref Range   SARS Coronavirus 2 Ag Negative Negative     ASSESSMENT & PLAN:  1. Encounter for laboratory testing for COVID-19 virus    Rapid COVID negative.  Culture sent.  Patient should remain in quarantine until they have received culture results.  If negative you may resume normal activities (go back to work/school) while practicing hand hygiene, social distance, and mask wearing.  If positive, patient should remain in quarantine for 10 days from symptom onset AND greater than 72 hours after symptoms resolution (absence of fever without the use of fever-reducing medication and improvement in respiratory symptoms), whichever is longer Get plenty of rest and push fluids Use OTC zyrtec for nasal congestion, runny nose, and/or sore throat Use OTC flonase for nasal congestion and runny nose Use medications daily for symptom relief Use OTC medications like ibuprofen or tylenol as needed fever or pain Call or go to the ED if you have any new or worsening symptoms such as fever, cough, shortness of breath, chest tightness, chest pain, turning  blue, changes in mental status, etc...    Reviewed expectations re: course of current medical issues. Questions answered. Outlined signs and symptoms indicating need for more acute intervention. Patient verbalized understanding. After Visit Summary given.         Lestine Box, PA-C 12/27/18 1922

## 2018-12-28 LAB — NOVEL CORONAVIRUS, NAA: SARS-CoV-2, NAA: NOT DETECTED

## 2019-01-10 ENCOUNTER — Inpatient Hospital Stay: Admission: RE | Admit: 2019-01-10 | Payer: No Typology Code available for payment source | Source: Ambulatory Visit

## 2019-01-11 MED FILL — SERTRALINE HCL 100 MG TAB: 100 | 90 days supply | Qty: 180 | Fill #0

## 2019-01-17 ENCOUNTER — Encounter: Payer: Self-pay | Admitting: Internal Medicine

## 2019-01-17 MED ORDER — AZITHROMYCIN 250 MG PO TABS: ORAL_TABLET | ORAL | 0 refills | Status: DC

## 2019-01-18 MED FILL — AZITHROMYCIN 250 MG TABLET: 250 | 5 days supply | Qty: 6 | Fill #0

## 2019-01-20 ENCOUNTER — Other Ambulatory Visit: Payer: Self-pay

## 2019-01-20 ENCOUNTER — Inpatient Hospital Stay: Admission: RE | Admit: 2019-01-20 | Payer: No Typology Code available for payment source | Source: Ambulatory Visit

## 2019-01-20 ENCOUNTER — Ambulatory Visit (INDEPENDENT_AMBULATORY_CARE_PROVIDER_SITE_OTHER)
Admission: RE | Admit: 2019-01-20 | Discharge: 2019-01-20 | Disposition: A | Discharge status: Home / Self Care | Payer: No Typology Code available for payment source | Source: Ambulatory Visit | Attending: Internal Medicine | Admitting: Internal Medicine

## 2019-01-20 DIAGNOSIS — J329 Chronic sinusitis, unspecified: Secondary | ICD-10-CM

## 2019-01-25 MED FILL — clonazePAM 0.5 MG TABS: 0.5 | 30 days supply | Qty: 90 | Fill #3

## 2019-01-27 ENCOUNTER — Ambulatory Visit (INDEPENDENT_AMBULATORY_CARE_PROVIDER_SITE_OTHER): Payer: No Typology Code available for payment source | Admitting: Otolaryngology

## 2019-01-27 ENCOUNTER — Other Ambulatory Visit: Payer: Self-pay

## 2019-01-27 VITALS — Temp 97.7°F

## 2019-01-27 DIAGNOSIS — J310 Chronic rhinitis: Secondary | ICD-10-CM

## 2019-01-27 NOTE — Progress Notes (Signed)
HPI: Pamela Waller is a 54 y.o. female who presents is referred by Dr. Jenny Reichmann for evaluation of chronic sinus problems.  She has had previous sinus surgery performed in [place] a number of years ago.  She also has history of hypogamma globulinemia.  She complains of chronic postnasal drainage as well as nasal stuffiness.  Sometimes she gets headaches and ear aches as well as eye pain.  She just recently been treated with a course of Z-Pak.  She had a CT scan performed 1 week ago and I had a chance to review this.  On the CT scan she had minimal mucoperiosteal thickening with no real air-fluid levels or opacification of the sinuses.  Previous surgery seems to be doing well.  She presently uses Zyrtec as well as occasional saline rinses.  She has tried nasal steroid sprays Flonase and Nasacort in the past but felt like this caused thrush in her mouth. She is not having any pain or discomfort today but does complain of nasal congestion and postnasal drainage. She smokes half a pack a day.  Past Medical History:  Diagnosis Date  . Chronic sinusitis 12/23/2018  . Endometriosis   . Hyperlipidemia   . Hypertension   . Hypogammaglobulinemia ([place])    sub class 1 and 3 deficiency  . Panic attacks   . Sinusitis   . Vitamin B12 deficiency 12/23/2018  . Vitamin D deficiency 12/23/2018   Past Surgical History:  Procedure Laterality Date  . ABDOMINAL HYSTERECTOMY    . BLADDER SURGERY    . BREAST LUMPECTOMY    . CESAREAN SECTION    . KNEE SURGERY    . NASAL SEPTUM SURGERY    . SINUSOTOMY     Social History   Socioeconomic History  . Marital status: Widowed    Spouse name: Not on file  . Number of children: Not on file  . Years of education: Not on file  . Highest education level: Not on file  Occupational History  . Not on file  Tobacco Use  . Smoking status: Current Every Day Smoker    Packs/day: 0.50  . Smokeless tobacco: Never Used  Substance and Sexual Activity  . Alcohol use:  Never  . Drug use: Never  . Sexual activity: Not on file  Other Topics Concern  . Not on file  Social History Narrative  . Not on file   Social Determinants of Health   Financial Resource Strain:   . Difficulty of Paying Living Expenses: Not on file  Food Insecurity:   . Worried About Charity fundraiser in the Last Year: Not on file  . Ran Out of Food in the Last Year: Not on file  Transportation Needs:   . Lack of Transportation (Medical): Not on file  . Lack of Transportation (Non-Medical): Not on file  Physical Activity:   . Days of Exercise per Week: Not on file  . Minutes of Exercise per Session: Not on file  Stress:   . Feeling of Stress : Not on file  Social Connections:   . Frequency of Communication with Friends and Family: Not on file  . Frequency of Social Gatherings with Friends and Family: Not on file  . Attends Religious Services: Not on file  . Active Member of Clubs or Organizations: Not on file  . Attends Archivist Meetings: Not on file  . Marital Status: Not on file   Family History  Problem Relation Age of Onset  .  Asthma Mother   . Arthritis Mother   . Pulmonary fibrosis Father   . Hyperlipidemia Father   . Hypertension Father    Allergies  Allergen Reactions  . Dilaudid [Hydromorphone Hcl] Anaphylaxis  . Morphine And Related Anaphylaxis  . Codeine   . Penicillins   . Prednisone     psychosis  . Prozac [Fluoxetine Hcl]    Prior to Admission medications   Medication Sig Start Date End Date Taking? Authorizing Provider  aspirin 81 MG chewable tablet Chew by mouth daily.   Yes [provider]  azithromycin (ZITHROMAX) 250 MG tablet 2 ab by mouth day 1, then 1 per day 01/17/19  Yes Biagio Borg, MD  cholecalciferol (VITAMIN D3) 25 MCG (1000 UT) tablet Take 2,000 Units by mouth daily.   Yes [provider]  clonazePAM (KLONOPIN) 0.5 MG tablet Take 0.5 mg by mouth 3 (three) times daily as needed for anxiety.   Yes  [provider]  hydrochlorothiazide (MICROZIDE) 12.5 MG capsule Take 1 capsule (12.5 mg total) by mouth daily. 12/23/18 12/23/19 Yes Biagio Borg, MD  pantoprazole (PROTONIX) 40 MG tablet Take 1 tablet (40 mg total) by mouth daily. 12/23/18  Yes Biagio Borg, MD  propranolol ER (INDERAL LA) 60 MG 24 hr capsule Take 1 capsule (60 mg total) by mouth daily. 12/23/18  Yes Biagio Borg, MD  rosuvastatin (CRESTOR) 20 MG tablet Take 20 mg by mouth daily.   Yes [provider]  sertraline (ZOLOFT) 100 MG tablet Take 200 mg by mouth daily.   Yes [provider]  vitamin B-12 (CYANOCOBALAMIN) 1000 MCG tablet Take 1 tablet (1,000 mcg total) by mouth daily. 12/23/18  Yes Biagio Borg, MD     Positive ROS: No hearing problems or vertigo.  All other systems have been reviewed and were otherwise negative with the exception of those mentioned in the HPI and as above.  Physical Exam: Constitutional: Alert, well-appearing, no acute distress Ears: External ears without lesions or tenderness. Ear canals are clear bilaterally with intact, clear TMs bilaterally. Nasal: External nose without lesions. Septum relatively midline.  She had moderate rhinitis with large inferior turbinates..  After decongesting the nose with Afrin nasal endoscopy was performed.  On nasal endoscopy the right ethmoid is slightly more open than the left ethmoid the right maxillary ostia is easily visualized and is patent and clear.  Left middle meatus is a little more narrow but no purulent discharge noted from the ethmoid or maxillary region.  Posterior nasal cavity was clear.  No evidence of infection. Oral: Lips and gums without lesions. Tongue and palate mucosa without lesions. Posterior oropharynx clear. Neck: No palpable adenopathy or masses Respiratory: Breathing comfortably  Skin: No facial/neck lesions or rash noted.  Nasal/sinus endoscopy  Date/Time: 01/27/2019 1:39 PM Performed by: Rozetta Nunnery, MD Authorized by: Rozetta Nunnery, MD   Consent:    Consent obtained:  Verbal   Consent given by:  Patient   Risks discussed:  Pain Procedure details:    Indications: sino-nasal symptoms     Instrument: flexible fiberoptic nasal endoscope     Scope location: bilateral nare   Nasal cavity:    edema     edema   Sinus:    Right middle meatus: normal     Left middle meatus: normal     Right nasopharynx: normal     Left nasopharynx: normal   Comments:     On nasal endoscopy she had  mild swelling of the mucosa with clear mucus discharge.  Both middle meatus regions were patent with no mucopurulent discharge.  She had moderate size inferior turbinates.    Assessment: Chronic rhinitis History of chronic sinus infections  Plan: Discussed with Zelna today concerning best medical treatment for the sinus symptoms and congestion. Would recommend use of Nasacort 2 sprays each nostril at night as this will help reduce the congestion within the nasal cavity.  She will follow-up here if she has any thrush type symptoms develop in her mouth or throat. If she has any persistent colored discharge I would recommend obtaining a culture before starting antibiotics.  She can follow-up here for culture next time she has a sinus infection. Also reviewed with her concerning use of saline irrigation as well as occasional use of Afrin but no more than 2 or 3 times per week as this is addictive. She will follow up here next time she has a sinus infection.  I prescribed Nasacort today with refills.   Radene Journey, MD   CC:

## 2019-02-02 ENCOUNTER — Ambulatory Visit: Payer: No Typology Code available for payment source | Admitting: Gastroenterology

## 2019-02-10 ENCOUNTER — Ambulatory Visit (INDEPENDENT_AMBULATORY_CARE_PROVIDER_SITE_OTHER): Payer: No Typology Code available for payment source | Admitting: Infectious Diseases

## 2019-02-10 ENCOUNTER — Encounter: Payer: Self-pay | Admitting: Infectious Diseases

## 2019-02-10 ENCOUNTER — Other Ambulatory Visit: Payer: Self-pay

## 2019-02-10 DIAGNOSIS — D801 Nonfamilial hypogammaglobulinemia: Secondary | ICD-10-CM

## 2019-02-10 NOTE — Progress Notes (Signed)
   Subjective:    Patient ID: Pamela Waller, female    DOB: [birth year], 54 y.o.   MRN: [id number]  HPI 54 yo F with hx of HTN, hyperlipidemia and hypogammaglobulinemia.  She was previously followed at [place], [place]. She was then followed by an ID MD in [place] where she was dx 2018. Her father also had hypogammglobunemia as well.  Has never been on gamma-globulinemia. Moved back to [employer] to be with her grandkids.  She has significant fatigue. Also hx of reflux (she attributes to "comfort food", has gained 12#) and has sinus issues. She had recent sinus CT which "looked good" but still has sinus d/c, ear pain.  "I don't like taking antibiotics". She had sinus surgery 3 years ago and was on IV anbx (daptomycin and a*) for 6 weeks after. She had colon 2018 that was "good" and endoscopy ("they dilated my esophagus").  Does not respond to vaccines very well- last challenge was pneumovax several years ago.   The past medical history, family history and social history were reviewed/updated in [employer]  She works at [employer].   prev IgG [id number] [phone]. Her last IgG level 01-09-19 was 523 done by Dr Jenny Reichmann.   Review of Systems  Constitutional: Positive for fatigue. Negative for appetite change, chills, fever and unexpected weight change.  HENT: Positive for congestion and sinus pressure.   Respiratory: Negative for cough and shortness of breath.   Gastrointestinal: Negative for constipation and diarrhea.  Genitourinary: Positive for frequency. Negative for difficulty urinating.       Hx of prolapsed bladder.   Musculoskeletal: Positive for arthralgias and myalgias.       L hip pain (no changes on bone, ortho, neuro eval)  Neurological: Negative for numbness.  Psychiatric/Behavioral: Positive for sleep disturbance.       Objective:   Physical Exam Vitals reviewed.  Constitutional:      Appearance: Normal appearance. She is obese.  HENT:     Right Ear: Tympanic membrane, ear canal and external ear  normal. There is no impacted cerumen.     Left Ear: Tympanic membrane, ear canal and external ear normal. There is no impacted cerumen.     Mouth/Throat:     Mouth: Mucous membranes are moist.     Pharynx: No oropharyngeal exudate.  Eyes:     Extraocular Movements: Extraocular movements intact.     Pupils: Pupils are equal, round, and reactive to light.  Cardiovascular:     Rate and Rhythm: Normal rate and regular rhythm.  Pulmonary:     Effort: Pulmonary effort is normal.     Breath sounds: Normal breath sounds.  Abdominal:     General: Bowel sounds are normal. There is no distension.     Palpations: Abdomen is soft.     Tenderness: There is no abdominal tenderness.  Musculoskeletal:        General: Normal range of motion.     Cervical back: Normal range of motion and neck supple.     Right lower leg: No edema.     Left lower leg: No edema.  Neurological:     General: No focal deficit present.     Mental Status: She is alert.  Psychiatric:        Mood and Affect: Mood normal.           Assessment & Plan:

## 2019-02-10 NOTE — Assessment & Plan Note (Signed)
She appears to be doing well. Her IgG levels are borderline. Her IgM and IgA levels are normal.  Will ask immunology for their opinion- I am not sure with her borderline levels that she would benefit from IVIg. She may be a better candidate for episodic doses if she becomes ill.  No vaccines today.  She is at increased risk for non-Hodgkin's lymphoma. I do not believe there is any increased screening she would need. Will watch for sx.  She does not want prophylactic anbx, can re-address this later as needed.  She will return in 2 weeks.

## 2019-02-14 ENCOUNTER — Other Ambulatory Visit: Payer: Self-pay | Admitting: Infectious Diseases

## 2019-02-14 DIAGNOSIS — D801 Nonfamilial hypogammaglobulinemia: Secondary | ICD-10-CM

## 2019-02-14 NOTE — Telephone Encounter (Signed)
Will 'cc Margaret for the referral.  Follow up appointment cancelled for now. Landis Gandy, RN

## 2019-02-24 ENCOUNTER — Ambulatory Visit (INDEPENDENT_AMBULATORY_CARE_PROVIDER_SITE_OTHER): Payer: No Typology Code available for payment source | Admitting: Allergy and Immunology

## 2019-02-24 ENCOUNTER — Encounter: Payer: Self-pay | Admitting: Allergy and Immunology

## 2019-02-24 ENCOUNTER — Other Ambulatory Visit: Payer: Self-pay

## 2019-02-24 ENCOUNTER — Ambulatory Visit: Payer: No Typology Code available for payment source | Admitting: Infectious Diseases

## 2019-02-24 VITALS — BP 132/88 | HR 56 | Temp 98.4°F | Resp 14 | Ht 58.3 in | Wt 157.2 lb

## 2019-02-24 DIAGNOSIS — J3089 Other allergic rhinitis: Secondary | ICD-10-CM

## 2019-02-24 DIAGNOSIS — D801 Nonfamilial hypogammaglobulinemia: Secondary | ICD-10-CM | POA: Diagnosis not present

## 2019-02-24 DIAGNOSIS — R5383 Other fatigue: Secondary | ICD-10-CM

## 2019-02-24 DIAGNOSIS — J328 Other chronic sinusitis: Secondary | ICD-10-CM | POA: Diagnosis not present

## 2019-02-24 DIAGNOSIS — G43909 Migraine, unspecified, not intractable, without status migrainosus: Secondary | ICD-10-CM | POA: Diagnosis not present

## 2019-02-24 NOTE — Progress Notes (Signed)
[place] [facility] [place] - [place] - [place] - [place]   Dear Dr. Johnnye Sima,  Thank you for referring Pamela Waller to the [facility] on 02/24/2019.   Below is a summation of this patient's evaluation and recommendations.  Thank you for your referral. I will keep you informed about this patient's response to treatment.   If you have any questions please do not hesitate to contact me.   Sincerely,  Jiles Prows, MD Allergy / Immunology [place]   ______________________________________________________________________    NEW PATIENT NOTE  Referring Provider: Campbell Riches, MD Primary Provider: Biagio Borg, MD Date of office visit: 02/24/2019    Subjective:   Chief Complaint:  Pamela Waller (DOB: February 10, 1965) is a 54 y.o. female who presents to the clinic on 02/24/2019 with a chief complaint of hypogammaglobulinemia .     HPI: Pamela Waller presents to this clinic in evaluation of hypogammaglobulinemia identified with a recent blood test.  Her history includes many years of "chronic sinus problems".  Apparently she required FBSS in 2018 and then was treated with prolonged antibiotic administration for what appeared to be a persistent infection of her airway.  The therapy mentioned above did not really clear out her airway and she has chronic postnasal drip and throat clearing and constant coughing and left ear pain and blows out yellow to brown nasal discharge in the morning and sometimes it is a little bit bloody and she has headaches.  Her headaches occur on a daily basis upon awakening in the morning.  Sometimes the headaches wake her up in the morning.  She feels as though her head is being squeezed in a vice grip and she takes Tylenol every day for this issue.  She has been treated with Flonase in the past but this gave rise to thrush.  She now uses nasal saline on a pretty  regular basis.  She smokes tobacco at a rate of 5 to 6 cigarettes/day for stress reduction which has been a longstanding hobby.  She tells me that she has received the Pneumovax about 2 years ago in the treatment of low levels of antibodies directed against Pneumovax.  She has extreme fatigue.  She takes a nap during the daytime and she is very sleepy throughout the day.  She does not have fractured sleep.  Apparently she had a sleep study which identified 17 minutes of sleep time with hypoxemia but apparently there was no follow-up regarding this issue because of a logistical problem.  She uses cetirizine every day.  She has stomach issues with belching and regurgitation for which she uses peppermint oil.  She drinks 2 coffees per day and has no other source of caffeine consumption.  Past Medical History:  Diagnosis Date  . Chronic sinusitis 12/23/2018  . Endometriosis   . Hyperlipidemia   . Hypertension   . Hypogammaglobulinemia ([place])    sub class 1 and 3 deficiency  . Panic attacks   . Sinusitis   . Vitamin B12 deficiency 12/23/2018  . Vitamin D deficiency 12/23/2018    Past Surgical History:  Procedure Laterality Date  . ABDOMINAL HYSTERECTOMY    . BLADDER SURGERY    . BREAST LUMPECTOMY    . CESAREAN SECTION    . KNEE SURGERY    . NASAL SEPTUM SURGERY    . SINUSOTOMY      Allergies as of 02/24/2019  Reactions   Dilaudid [hydromorphone Hcl] Anaphylaxis   Morphine And Related Anaphylaxis   Codeine    Penicillins Hives   Prednisone    psychosis   Prozac [fluoxetine Hcl]       Medication List    aspirin 81 MG chewable tablet Chew by mouth daily.   cetirizine 10 MG tablet Commonly known as: ZYRTEC Take 10 mg by mouth daily.   cholecalciferol 25 MCG (1000 UNIT) tablet Commonly known as: VITAMIN D3 Take 4,000 Units by mouth daily.   clonazePAM 0.5 MG tablet Commonly known as: KLONOPIN Take 0.5 mg by mouth 3 (three) times daily as needed for anxiety.     hydrochlorothiazide 12.5 MG capsule Commonly known as: Microzide Take 1 capsule (12.5 mg total) by mouth daily.   propranolol ER 60 MG 24 hr capsule Commonly known as: Inderal LA Take 1 capsule (60 mg total) by mouth daily.   rosuvastatin 20 MG tablet Commonly known as: CRESTOR Take 20 mg by mouth daily.   sertraline 100 MG tablet Commonly known as: ZOLOFT Take 200 mg by mouth daily.   vitamin B-12 1000 MCG tablet Commonly known as: CYANOCOBALAMIN Take 1 tablet (1,000 mcg total) by mouth daily. What changed: additional instructions       Review of systems negative except as noted in HPI / PMHx or noted below:  Review of Systems  Constitutional: Negative.   HENT: Negative.   Eyes: Negative.   Respiratory: Negative.   Cardiovascular: Negative.   Gastrointestinal: Negative.   Genitourinary: Negative.   Musculoskeletal: Negative.   Skin: Negative.   Neurological: Negative.   Endo/Heme/Allergies: Negative.   Psychiatric/Behavioral: Negative.     Family History  Problem Relation Age of Onset  . Asthma Mother   . Arthritis Mother   . Pulmonary fibrosis Father   . Hyperlipidemia Father   . Hypertension Father     Social History   Socioeconomic History  . Marital status: Widowed    Spouse name: Not on file  . Number of children: Not on file  . Years of education: Not on file  . Highest education level: Not on file  Occupational History  . Not on file  Tobacco Use  . Smoking status: Current Every Day Smoker    Packs/day: 0.50    Years: 28.00    Pack years: 14.00  . Smokeless tobacco: Never Used  Substance and Sexual Activity  . Alcohol use: Never  . Drug use: Never  . Sexual activity: Not on file  Other Topics Concern  . Not on file  Social History Narrative  . Not on file   Social Determinants of Health   Financial Resource Strain:   . Difficulty of Paying Living Expenses: Not on file  Food Insecurity:   . Worried About Charity fundraiser in  the Last Year: Not on file  . Ran Out of Food in the Last Year: Not on file  Transportation Needs:   . Lack of Transportation (Medical): Not on file  . Lack of Transportation (Non-Medical): Not on file  Physical Activity:   . Days of Exercise per Week: Not on file  . Minutes of Exercise per Session: Not on file  Stress:   . Feeling of Stress : Not on file  Social Connections:   . Frequency of Communication with Friends and Family: Not on file  . Frequency of Social Gatherings with Friends and Family: Not on file  . Attends Religious Services: Not on file  . Active  Member of Clubs or Organizations: Not on file  . Attends Archivist Meetings: Not on file  . Marital Status: Not on file  Intimate Partner Violence:   . Fear of Current or Ex-Partner: Not on file  . Emotionally Abused: Not on file  . Physically Abused: Not on file  . Sexually Abused: Not on file    Environmental and Social history  Lives in a house with a dry environment, a dog located inside the household, no carpet in the bedroom, plastic on the bed, plastic on the pillow, actively smoking tobacco products, and employment as a CMA.  Objective:   Vitals:   02/24/19 1350  BP: 132/88  Pulse: (!) 56  Resp: 14  Temp: 98.4 F (36.9 C)  SpO2: 96%   Height: 4' 10.3" (148.1 cm) Weight: 157 lb 3.2 oz (71.3 kg)  Physical Exam Constitutional:      Appearance: She is not diaphoretic.  HENT:     Head: Normocephalic. No right periorbital erythema or left periorbital erythema.     Right Ear: Tympanic membrane, ear canal and external ear normal.     Left Ear: Tympanic membrane, ear canal and external ear normal.     Nose: Nose normal. No mucosal edema or rhinorrhea.     Mouth/Throat:     Pharynx: Uvula midline. No oropharyngeal exudate.  Eyes:     General: Lids are normal.     Conjunctiva/sclera: Conjunctivae normal.     Pupils: Pupils are equal, round, and reactive to light.  Neck:     Thyroid: No  thyromegaly.     Trachea: Trachea normal. No tracheal tenderness or tracheal deviation.  Cardiovascular:     Rate and Rhythm: Normal rate and regular rhythm.     Heart sounds: Normal heart sounds, S1 normal and S2 normal. No murmur.  Pulmonary:     Effort: Pulmonary effort is normal. No respiratory distress.     Breath sounds: Normal breath sounds. No stridor. No wheezing or rales.  Chest:     Chest wall: No tenderness.  Abdominal:     General: There is no distension.     Palpations: Abdomen is soft. There is no mass.     Tenderness: There is no abdominal tenderness. There is no guarding or rebound.  Musculoskeletal:        General: No tenderness.  Lymphadenopathy:     Head:     Right side of head: No tonsillar adenopathy.     Left side of head: No tonsillar adenopathy.     Cervical: No cervical adenopathy.  Skin:    Coloration: Skin is not pale.     Findings: No erythema or rash.     Nails: There is no clubbing.  Neurological:     Mental Status: She is alert.     Diagnostics: Allergy skin tests were not performed.   Results of blood tests obtained 23 December 2018 identified IgG 523 mg/DL, IgM 79 mg/DL, IgA 112 mg/DL, WBC 6.4, absolute eosinophil 300, absolute lymphocyte 2100, hemoglobin 13.7, platelet 111, TSH 2.79 IU/mL.  Results of a sinus CT scan obtained 20 January 2019 identified the following:  Paranasal sinuses:  Frontal: Left division is clear except for minimal mucosal thickening at the frontal ethmoid junction. Mild mucosal thickening at the right frontal ethmoid junction region.  Ethmoid: Previous partial ethmoidectomies, more extensive on the right than the left. Some mucosal thickening within the residual ethmoid regions.  Maxillary: Previous functional endoscopic sinus surgery on the  right with nasoantral window creation. Mild maxillary sinus mucosal thickening inferiorly, left more than right. Possible odontogenic etiology.  Sphenoid: Left  division is clear. Mucosal thickening along the right division floor.  Right ostiomeatal unit: Previous functional endoscopic sinus surgery. Wide airspace communication.  Left ostiomeatal unit: Infundibulum is patent. No unfavorable variant.  Nasal passages: Patent. Intact nasal septum is midline.  Anatomy: No pneumatization superior to anterior ethmoid notches. Symmetric and intact olfactory grooves and fovea ethmoidalis, Keros I (1-21mm). Sellar sphenoid pneumatization pattern. No dehiscence of carotid or optic canals. No onodi cell.  Results of a sinus CT scan obtained 16 March 2018 identifies the following:  # Sinuses: # Frontal: Mild-to-moderate mucosal thickening of the frontal sinuses bilaterally. # Ethmoid: Previous partial ethmoidectomy on the right with moderate mucosal thickening.. # Maxillary: Mild right and moderate left because of thickening. Postsurgical change with antral window on the right.. The ostiomeatal unit on the right has been surgically resected with mucosal thickening narrowing the left OMU. # Sphenoid: This tiny mucous retention cyst of the left sphenoid sinus.. # Nasal cavities: Clear bilaterally.. Minimal leftward posterior and rightward anterior bowing of the nasal septum.. # Bones: No aggressive bony changes or acute fracture. No evidence of sinus wall expansion or thickening. # Additional comments: No mass appreciated. Visualized intracranial structures are unremarkable..  Results of a chest x-ray obtained 27 November 2015 identified the following:  LUNGS/PLEURA: No pneumothorax. Clear lungs without focal opacity.  HEART/MEDIASTINUM: Normal cardiomediastinal silhouette.     Assessment and Plan:    1. Other chronic sinusitis   2. Perennial allergic rhinitis   3. Hypogammaglobulinemia ([place])   4. Migraine syndrome   5. Other fatigue     1.  Use nicotine replacements to eliminate tobacco smoke exposure  2.  Treat and prevent  inflammation:   A. OTC Rhinocort / budesonide - 2 sprays each nostril 1 time per day  3.  Treat and prevent reflux:   A. Consolidate caffeine consumption  B. Dexilant 60 mg - 1 tablet 1 time per day  4. Treat and prevent headache:   A. Consolidate caffeine consumption  B. Eliminate analgesic use  5. If needed:   A. Nasal saline wash  B. Loratadine 10 mg - 1-2 tablet 1 time per day  6. Cetirizine induced fatigue???  7. Obtain nocturnal oximetry study  8. Blood -antipneumococcal antibody, antitetanus antibody, area two aero allergen profile  9. Obtain covid vaccine  10. Return to clinic in 4 weeks  Pamela Waller appears to have an inflamed and irritated airway in the setting of possible hypogammaglobulinemia, tobacco use, possible atopic disease, and possible reflux induced respiratory disease.  We will address these issues with the therapy noted above.  As well, she appears to have significant fatigue during the daytime and has been told that she was hypoxemic during a sleep study in the past and she is also using cetirizine on a daily basis.  We will have her eliminate use of cetirizine, obtain a nocturnal oximetry study, and make a determination about further evaluation for her response to this plan.  I will be contacting her with the results of her blood test once they are available for review.  I will see her back in this clinic in 4 weeks or earlier if there is a problem.  Jiles Prows, MD Allergy / Immunology [facility]

## 2019-02-24 NOTE — Patient Instructions (Addendum)
  1.  Use nicotine replacements to eliminate tobacco smoke exposure  2.  Treat and prevent inflammation:   A. OTC Rhinocort / budesonide - 2 sprays each nostril 1 time per day  3.  Treat and prevent reflux:   A. Consolidate caffeine consumption  B. Dexilant 60 mg - 1 tablet 1 time per day  4. Treat and prevent headache:   A. Consolidate caffeine consumption  B. Eliminate analgesic use  5. If needed:   A. Nasal saline wash  B. Loratadine 10 mg - 1-2 tablet 1 time per day  6. Cetirizine induced fatigue???  7. Obtain nocturnal oximetry study  8. Blood -antipneumococcal antibody, antitetanus antibody, area two aero allergen profile  9. Obtain covid vaccine  10. Return to clinic in 4 weeks

## 2019-02-28 ENCOUNTER — Encounter: Payer: Self-pay | Admitting: Allergy and Immunology

## 2019-02-28 MED FILL — clonazePAM 0.5 MG TABS: 0.5 | 30 days supply | Qty: 90 | Fill #0

## 2019-03-08 LAB — STREP PNEUMONIAE 23 SEROTYPES IGG
Pneumo Ab Type 1*: 4.1 ug/mL (ref >1.3)
Pneumo Ab Type 12 (12F)*: 1.2 ug/mL — ABNORMAL LOW (ref >1.3)
Pneumo Ab Type 14*: 9.8 ug/mL (ref >1.3)
Pneumo Ab Type 17 (17F)*: 0.7 ug/mL — ABNORMAL LOW (ref >1.3)
Pneumo Ab Type 19 (19F)*: 5.5 ug/mL (ref >1.3)
Pneumo Ab Type 2*: 4.8 ug/mL (ref >1.3)
Pneumo Ab Type 20*: 9.8 ug/mL (ref >1.3)
Pneumo Ab Type 22 (22F)*: 0.9 ug/mL — ABNORMAL LOW (ref >1.3)
Pneumo Ab Type 23 (23F)*: 0.3 ug/mL — ABNORMAL LOW (ref >1.3)
Pneumo Ab Type 26 (6B)*: 1 ug/mL — ABNORMAL LOW (ref >1.3)
Pneumo Ab Type 3*: 15.2 ug/mL (ref >1.3)
Pneumo Ab Type 34 (10A)*: 1.7 ug/mL (ref >1.3)
Pneumo Ab Type 4*: 0.6 ug/mL — ABNORMAL LOW (ref >1.3)
Pneumo Ab Type 43 (11A)*: 1.2 ug/mL — ABNORMAL LOW (ref >1.3)
Pneumo Ab Type 5*: 1.6 ug/mL (ref >1.3)
Pneumo Ab Type 51 (7F)*: >21.2 ug/mL (ref >1.3)
Pneumo Ab Type 54 (15B)*: 0.6 ug/mL — ABNORMAL LOW (ref >1.3)
Pneumo Ab Type 56 (18C)*: 5.3 ug/mL (ref >1.3)
Pneumo Ab Type 57 (19A)*: 10.5 ug/mL (ref >1.3)
Pneumo Ab Type 68 (9V)*: 11 ug/mL (ref >1.3)
Pneumo Ab Type 70 (33F)*: 0.9 ug/mL — ABNORMAL LOW (ref >1.3)
Pneumo Ab Type 8*: 8.8 ug/mL (ref >1.3)
Pneumo Ab Type 9 (9N)*: 3.4 ug/mL (ref >1.3)

## 2019-03-08 LAB — ALLERGENS W/TOTAL IGE AREA 2
Alternaria Alternata IgE: <0.1 kU/L
Aspergillus Fumigatus IgE: <0.1 kU/L
Bermuda Grass IgE: <0.1 kU/L
Cat Dander IgE: <0.1 kU/L
Cedar, Mountain IgE: <0.1 kU/L
Cladosporium Herbarum IgE: <0.1 kU/L
Cockroach, German IgE: <0.1 kU/L
Common Silver Birch IgE: <0.1 kU/L
Cottonwood IgE: <0.1 kU/L
D Farinae IgE: <0.1 kU/L
D Pteronyssinus IgE: <0.1 kU/L
Dog Dander IgE: <0.1 kU/L
Elm, American IgE: <0.1 kU/L
IgE (Immunoglobulin E), Serum: 20 IU/mL (ref 6–495)
Johnson Grass IgE: <0.1 kU/L
Maple/Box Elder IgE: <0.1 kU/L
Mouse Urine IgE: <0.1 kU/L
Oak, White IgE: <0.1 kU/L
Pecan, Hickory IgE: <0.1 kU/L
Penicillium Chrysogen IgE: <0.1 kU/L
Pigweed, Rough IgE: <0.1 kU/L
Ragweed, Short IgE: 0.15 kU/L — AB
Sheep Sorrel IgE Qn: <0.1 kU/L
Timothy Grass IgE: <0.1 kU/L
White Mulberry IgE: <0.1 kU/L

## 2019-03-08 LAB — TETANUS ANTIBODY, IGG: Tetanus Ab, IgG: >7 IU/mL (ref <0.10)

## 2019-03-09 ENCOUNTER — Encounter: Payer: Self-pay | Admitting: Allergy and Immunology

## 2019-03-14 ENCOUNTER — Encounter: Payer: Self-pay | Admitting: Allergy and Immunology

## 2019-03-21 ENCOUNTER — Encounter: Payer: Self-pay | Admitting: Internal Medicine

## 2019-03-21 MED ORDER — ROSUVASTATIN CALCIUM 20 MG PO TABS: 20.0000 mg | ORAL_TABLET | Freq: Every day | ORAL | 2 refills | Status: DC

## 2019-03-22 ENCOUNTER — Encounter: Payer: Self-pay | Admitting: Allergy and Immunology

## 2019-03-22 MED FILL — PROPRANOLOL HCL ER 60 MG CP: 60 | 90 days supply | Qty: 90 | Fill #0

## 2019-03-22 MED FILL — ROSUVASTATIN CALCIUM 20 MG: 20 | 90 days supply | Qty: 90 | Fill #0

## 2019-04-01 ENCOUNTER — Encounter: Payer: Self-pay | Admitting: Allergy and Immunology

## 2019-04-07 MED FILL — SERTRALINE HCL 100 MG TAB: 100 | 90 days supply | Qty: 180 | Fill #0

## 2019-04-19 ENCOUNTER — Other Ambulatory Visit: Payer: Self-pay

## 2019-04-19 ENCOUNTER — Ambulatory Visit (INDEPENDENT_AMBULATORY_CARE_PROVIDER_SITE_OTHER): Payer: No Typology Code available for payment source | Admitting: Allergy and Immunology

## 2019-04-19 VITALS — BP 118/76 | HR 61 | Temp 98.1°F | Resp 16 | Ht 59.75 in | Wt 158.6 lb

## 2019-04-19 DIAGNOSIS — G43909 Migraine, unspecified, not intractable, without status migrainosus: Secondary | ICD-10-CM | POA: Diagnosis not present

## 2019-04-19 DIAGNOSIS — J328 Other chronic sinusitis: Secondary | ICD-10-CM | POA: Diagnosis not present

## 2019-04-19 DIAGNOSIS — F17210 Nicotine dependence, cigarettes, uncomplicated: Secondary | ICD-10-CM | POA: Diagnosis not present

## 2019-04-19 DIAGNOSIS — D801 Nonfamilial hypogammaglobulinemia: Secondary | ICD-10-CM | POA: Diagnosis not present

## 2019-04-19 DIAGNOSIS — K219 Gastro-esophageal reflux disease without esophagitis: Secondary | ICD-10-CM

## 2019-04-19 MED ORDER — CYPROHEPTADINE HCL 4 MG PO TABS: ORAL_TABLET | ORAL | 0 refills | Status: DC

## 2019-04-19 MED ORDER — AZELASTINE-FLUTICASONE 137-50 MCG/ACT NA SUSP: 1.0000 | Freq: Two times a day (BID) | NASAL | 5 refills | Status: DC

## 2019-04-19 MED ORDER — FAMOTIDINE 40 MG PO TABS: 40.0000 mg | ORAL_TABLET | Freq: Every evening | ORAL | 5 refills | Status: DC

## 2019-04-19 MED ORDER — PANTOPRAZOLE SODIUM 40 MG PO TBEC: 40.0000 mg | DELAYED_RELEASE_TABLET | Freq: Every morning | ORAL | 5 refills | Status: DC

## 2019-04-19 MED FILL — FAMOTIDINE 40 MG TABS: 40 | 30 days supply | Qty: 30 | Fill #0

## 2019-04-19 MED FILL — CYPROHEPTADINE 4 MG TABLET: 4 | 30 days supply | Qty: 30 | Fill #0

## 2019-04-19 MED FILL — PANTOPRAZOLE SOD DR 40 MG T: 40 | 90 days supply | Qty: 90 | Fill #0

## 2019-04-19 NOTE — Patient Instructions (Addendum)
  1.  Use nicotine replacements to eliminate tobacco smoke exposure.    2.  Eliminate use of over-the-counter nasal decongestant sprays  3.  Treat and prevent inflammation:   A.  Dymista-1 spray each nostril twice a day  3.  Treat and prevent reflux:   A. Consolidate caffeine consumption  B.  Pantoprazole 40 mg - 1 tablet in a.m.  C.  Famotidine 40 mg - 1 tablet in p.m.  4. Treat and prevent headache:   A. Consolidate caffeine consumption  B.  Periactin 4 mg - 1/2-1 tablet at bedtime (clonazepam?)  5. If needed:   A. Nasal saline wash  B. Loratadine 10 mg - 1-2 tablet 1 time per day  6.  Obtain Pneumovax and repeat Pneumo 23 antibody titer study in 6 weeks after immunization  7.  Return to clinic in 8 weeks or earlier if problem

## 2019-04-19 NOTE — Progress Notes (Signed)
[place] - [place] - [place]   Follow-up Note  Referring Provider: Biagio Borg, MD Primary Provider: Biagio Borg, MD Date of Office Visit: 04/19/2019  Subjective:   Pamela Waller (DOB: 09-15-1965) is a 54 y.o. female who returns to the [facility] on 04/19/2019 in re-evaluation of the following:  HPI: Ahmaria returns to this clinic in evaluation of hypogammaglobulinemia and a history of chronic sinus problems and tobacco use.  She still continues to have upper airway issues.  She is now using a Vicks nasal spray a few times per week along with saline.  She did try Rhinocort which was ineffective in alleviating her upper airway symptoms.  She continues to smoke about 3 cigarettes/day.  She also appears to have lots of postnasal drip.  Her reflux is still active with burning and regurgitation.  She did try Dexilant which did not help her.  She is now using over-the-counter peppermint oil which she thinks helps somewhat.  She has decreased her coffee to 1 time per day.  She still has extreme fatigue.  She did discontinue her cetirizine but this did not help her fatigue.  She takes a clonazepam every night for sleep as she has about 1 hour of initiation insomnia.  When she does get to sleep she does sleep quite well.  She still continues to have a daily headache.  As noted above she did decrease her coffee, consumption to 1 time per day.   Allergies as of 04/19/2019      Reactions   Dilaudid [hydromorphone Hcl] Anaphylaxis   Morphine And Related Anaphylaxis   Codeine    Penicillins Hives   Prednisone    psychosis   Prozac [fluoxetine Hcl]       Medication List    aspirin 81 MG chewable tablet Chew by mouth daily.   cholecalciferol 25 MCG (1000 UNIT) tablet Commonly known as: VITAMIN D3 Take 4,000 Units by mouth daily.   clonazePAM 0.5 MG tablet Commonly known as: KLONOPIN Take 0.5 mg by mouth 3 (three) times daily as  needed for anxiety.   hydrochlorothiazide 12.5 MG capsule Commonly known as: Microzide Take 1 capsule (12.5 mg total) by mouth daily.   loratadine 10 MG tablet Commonly known as: CLARITIN Take 10 mg by mouth daily.   propranolol ER 60 MG 24 hr capsule Commonly known as: Inderal LA Take 1 capsule (60 mg total) by mouth daily.   rosuvastatin 20 MG tablet Commonly known as: CRESTOR Take 1 tablet (20 mg total) by mouth daily.   sertraline 100 MG tablet Commonly known as: ZOLOFT Take 200 mg by mouth daily.       Past Medical History:  Diagnosis Date  . Chronic sinusitis 12/23/2018  . Endometriosis   . Hyperlipidemia   . Hypertension   . Hypogammaglobulinemia ([place])    sub class 1 and 3 deficiency  . Panic attacks   . Sinusitis   . Vitamin B12 deficiency 12/23/2018  . Vitamin D deficiency 12/23/2018    Past Surgical History:  Procedure Laterality Date  . ABDOMINAL HYSTERECTOMY    . BLADDER SURGERY    . BREAST LUMPECTOMY    . CESAREAN SECTION    . KNEE SURGERY    . NASAL SEPTUM SURGERY    . SINUSOTOMY      Review of systems negative except as noted in HPI / PMHx or noted below:  Review of Systems  Constitutional: Negative.   HENT:  Negative.   Eyes: Negative.   Respiratory: Negative.   Cardiovascular: Negative.   Gastrointestinal: Negative.   Genitourinary: Negative.   Musculoskeletal: Negative.   Skin: Negative.   Neurological: Negative.   Endo/Heme/Allergies: Negative.   Psychiatric/Behavioral: Negative.      Objective:   Vitals:   04/19/19 1437  BP: 118/76  Pulse: 61  Resp: 16  Temp: 98.1 F (36.7 C)  SpO2: 95%   Height: 4' 11.75" (151.8 cm)  Weight: 158 lb 9.6 oz (71.9 kg)   Physical Exam Constitutional:      Appearance: She is not diaphoretic.  HENT:     Head: Normocephalic.     Right Ear: Tympanic membrane, ear canal and external ear normal.     Left Ear: Tympanic membrane, ear canal and external ear normal.     Nose: Nose normal. No  mucosal edema or rhinorrhea.     Mouth/Throat:     Pharynx: Uvula midline. No oropharyngeal exudate.  Eyes:     Conjunctiva/sclera: Conjunctivae normal.  Neck:     Thyroid: No thyromegaly.     Trachea: Trachea normal. No tracheal tenderness or tracheal deviation.  Cardiovascular:     Rate and Rhythm: Normal rate and regular rhythm.     Heart sounds: Normal heart sounds, S1 normal and S2 normal. No murmur.  Pulmonary:     Effort: No respiratory distress.     Breath sounds: Normal breath sounds. No stridor. No wheezing or rales.  Lymphadenopathy:     Head:     Right side of head: No tonsillar adenopathy.     Left side of head: No tonsillar adenopathy.     Cervical: No cervical adenopathy.  Skin:    Findings: No erythema or rash.     Nails: There is no clubbing.  Neurological:     Mental Status: She is alert.     Diagnostics:  Results of a nocturnal oximetry study obtained on 17 March 2019 identified less than 1 minute with an oxygen saturation below 88%.  Results of blood tests obtained 28 February 2019 identified relatively adequate immune response to multiple serotypes of pneumococcus with high titers of antibodies.  She had 9 inadequate titers on her Pneumo 23 assay.  Tetanus antitoxoid IgG titer of greater than 7.0 IU/mL, serum IgE 20 KU/L, no antigen specific IgE antibodies directed against an area two aero allergen profile.  Assessment and Plan:   1. Hypogammaglobulinemia ([place])   2. Other chronic sinusitis   3. Migraine syndrome   4. Light tobacco smoker <10 cigarettes per day   5. LPRD (laryngopharyngeal reflux disease)     1.  Use nicotine replacements to eliminate tobacco smoke exposure.    2.  Eliminate use of over-the-counter nasal decongestant sprays  3.  Treat and prevent inflammation:   A.  Dymista-1 spray each nostril twice a day  3.  Treat and prevent reflux:   A. Consolidate caffeine consumption  B.  Pantoprazole 40 mg - 1 tablet in a.m.  C.   Famotidine 40 mg - 1 tablet in p.m.  4. Treat and prevent headache:   A. Consolidate caffeine consumption  B.  Periactin 4 mg - 1/2-1 tablet at bedtime (clonazepam?)  5. If needed:   A. Nasal saline wash  B. Loratadine 10 mg - 1-2 tablet 1 time per day  6.  Obtain Pneumovax and repeat Pneumo 23 antibody titer study in 6 weeks after immunization  7.  Return to clinic in 8 weeks or earlier  if problem  We will hopefully have Megumi eliminate the irritant effect of tobacco smoke exposure on her airway and also have her discontinue her nasal decongestant spray as she probably has some degree of rebound as a result of this use.  We will give her Dymista to use for an anti-inflammatory effect on her upper airway.  We will treat her reflux a little more aggressively as noted above and we will start her on a preventative agent for headaches and sleep insomnia using cyproheptadine prior to bedtime.  To complete her analysis of her immune system we will have her immunized with Pneumovax and repeat a Pneumo 23 antibody titer 6 weeks after immunization.  I will see her back in this clinic in 8 weeks or earlier if there is a problem.  Allena Katz, MD Allergy / Immunology [place]

## 2019-04-20 ENCOUNTER — Telehealth: Payer: Self-pay | Admitting: *Deleted

## 2019-04-20 ENCOUNTER — Encounter: Payer: Self-pay | Admitting: Allergy and Immunology

## 2019-04-20 NOTE — Telephone Encounter (Signed)
Received fax from pharmacy stating that Azelastine-Fluticasone PA was needed but that the patient must try and fail Fluticasone or Flunisolide first before approval of PA. Was going to split medications and send them in separately. Please advise on signature for Azelastine and Fluticasone separately. Thank You.

## 2019-04-20 NOTE — Telephone Encounter (Signed)
She has failed Flonase.  Would this qualify her for a approved PA?

## 2019-04-20 NOTE — Telephone Encounter (Signed)
Yes it would, I will work on the [place]. Thank You.

## 2019-04-20 NOTE — Telephone Encounter (Signed)
PA has been submitted for Azelastine-Fluticasone through CoverMyMeds and is currently pending approval or denial.

## 2019-04-21 MED FILL — clonazePAM 0.5 MG TABS: 0.5 | 30 days supply | Qty: 90 | Fill #0

## 2019-04-21 NOTE — Telephone Encounter (Signed)
PA is still pending.  

## 2019-04-25 NOTE — Telephone Encounter (Signed)
Approved and sent to the pharmacy. I am also sending a copy to the scan center.

## 2019-06-06 MED FILL — clonazePAM 0.5 MG TABS: 0.5 | 30 days supply | Qty: 90 | Fill #1

## 2019-06-09 MED FILL — ARMODAFINIL 200 MG TABS: 200 | 30 days supply | Qty: 30 | Fill #0

## 2019-06-17 MED FILL — ROSUVASTATIN CALCIUM 20 MG: 20 | 90 days supply | Qty: 90 | Fill #0

## 2019-06-17 MED FILL — PROPRANOLOL HCL ER 60 MG CP: 60 | 90 days supply | Qty: 90 | Fill #0

## 2019-07-11 MED FILL — SERTRALINE HCL 100 MG TAB: 100 | 90 days supply | Qty: 180 | Fill #0

## 2019-07-15 MED FILL — clonazePAM 0.5 MG TABS: 0.5 | 30 days supply | Qty: 90 | Fill #0

## 2019-07-22 ENCOUNTER — Ambulatory Visit: Payer: No Typology Code available for payment source | Admitting: Internal Medicine

## 2019-07-22 DIAGNOSIS — Z0289 Encounter for other administrative examinations: Secondary | ICD-10-CM

## 2019-08-09 ENCOUNTER — Encounter: Payer: Self-pay | Admitting: Internal Medicine

## 2019-08-09 DIAGNOSIS — R109 Unspecified abdominal pain: Secondary | ICD-10-CM

## 2019-08-30 MED FILL — clonazePAM 0.5 MG TABS: 0.5 | 30 days supply | Qty: 90 | Fill #1

## 2019-09-09 ENCOUNTER — Encounter: Payer: Self-pay | Admitting: Allergy and Immunology

## 2019-09-12 ENCOUNTER — Other Ambulatory Visit: Payer: Self-pay

## 2019-09-12 ENCOUNTER — Ambulatory Visit
Admission: RE | Admit: 2019-09-12 | Discharge: 2019-09-12 | Disposition: A | Discharge status: Home / Self Care | Payer: No Typology Code available for payment source | Source: Ambulatory Visit | Attending: Emergency Medicine | Admitting: Emergency Medicine

## 2019-09-12 VITALS — BP 128/89 | HR 63 | Temp 98.1°F | Resp 20

## 2019-09-12 DIAGNOSIS — Z1152 Encounter for screening for COVID-19: Secondary | ICD-10-CM

## 2019-09-12 DIAGNOSIS — J069 Acute upper respiratory infection, unspecified: Secondary | ICD-10-CM

## 2019-09-12 MED ORDER — BENZONATATE 100 MG PO CAPS: 100.0000 mg | ORAL_CAPSULE | Freq: Three times a day (TID) | ORAL | 0 refills | Status: DC

## 2019-09-12 NOTE — ED Provider Notes (Signed)
[place]   [id number] 09/12/19 Arrival Time: 3614   CC: COVID symptoms  SUBJECTIVE: History from: patient.  Pamela Waller is a 54 y.o. female who presents to the urgent care with a complaint of fatigue, body aches, cough and nasal congestion for the past 3 days.  Denies sick exposure to COVID, flu or strep.  Denies recent travel.  Has tried OTC medication without relief.  Denies aggravating factors.  Denies previous symptoms in the past.   Denies fever, chills, sinus pain, rhinorrhea, sore throat, SOB, wheezing, chest pain, nausea, changes in bowel or bladder habits.    ROS: As per HPI.  All other pertinent ROS negative.      Past Medical History:  Diagnosis Date  . Chronic sinusitis 12/23/2018  . Endometriosis   . Hyperlipidemia   . Hypertension   . Hypogammaglobulinemia ([place])    sub class 1 and 3 deficiency  . Panic attacks   . Sinusitis   . Vitamin B12 deficiency 12/23/2018  . Vitamin D deficiency 12/23/2018   Past Surgical History:  Procedure Laterality Date  . ABDOMINAL HYSTERECTOMY    . BLADDER SURGERY    . BREAST LUMPECTOMY    . CESAREAN SECTION    . KNEE SURGERY    . NASAL SEPTUM SURGERY    . SINUSOTOMY     Allergies  Allergen Reactions  . Dilaudid [Hydromorphone Hcl] Anaphylaxis  . Morphine And Related Anaphylaxis  . Codeine   . Penicillins Hives  . Prednisone     psychosis  . Prozac [Fluoxetine Hcl]    No current facility-administered medications on file prior to encounter.   Current Outpatient Medications on File Prior to Encounter  Medication Sig Dispense Refill  . aspirin 81 MG chewable tablet Chew by mouth daily.    . Azelastine-Fluticasone 137-50 MCG/ACT SUSP Place 1 spray into the nose in the morning and at bedtime. 23 g 5  . cholecalciferol (VITAMIN D3) 25 MCG (1000 UT) tablet Take 4,000 Units by mouth daily.     . clonazePAM (KLONOPIN) 0.5 MG tablet Take 0.5 mg by mouth 3 (three) times daily as needed for anxiety.    .  cyproheptadine (PERIACTIN) 4 MG tablet 1/2-1 tablet at bedtime 30 tablet 0  . famotidine (PEPCID) 40 MG tablet Take 1 tablet (40 mg total) by mouth every evening. 30 tablet 5  . hydrochlorothiazide (MICROZIDE) 12.5 MG capsule Take 1 capsule (12.5 mg total) by mouth daily. 90 capsule 3  . loratadine (CLARITIN) 10 MG tablet Take 10 mg by mouth daily.    . pantoprazole (PROTONIX) 40 MG tablet Take 1 tablet (40 mg total) by mouth in the morning. 30 tablet 5  . propranolol ER (INDERAL LA) 60 MG 24 hr capsule Take 1 capsule (60 mg total) by mouth daily. 90 capsule 3  . rosuvastatin (CRESTOR) 20 MG tablet Take 1 tablet (20 mg total) by mouth daily. 90 tablet 2  . sertraline (ZOLOFT) 100 MG tablet Take 200 mg by mouth daily.     Social History   Socioeconomic History  . Marital status: Widowed    Spouse name: Not on file  . Number of children: Not on file  . Years of education: Not on file  . Highest education level: Not on file  Occupational History  . Not on file  Tobacco Use  . Smoking status: Current Every Day Smoker    Packs/day: 0.50    Years: 28.00    Pack years: 14.00  . Smokeless  tobacco: Never Used  Substance and Sexual Activity  . Alcohol use: Never  . Drug use: Never  . Sexual activity: Not on file  Other Topics Concern  . Not on file  Social History Narrative  . Not on file   Social Determinants of Health   Financial Resource Strain:   . Difficulty of Paying Living Expenses: Not on file  Food Insecurity:   . Worried About Charity fundraiser in the Last Year: Not on file  . Ran Out of Food in the Last Year: Not on file  Transportation Needs:   . Lack of Transportation (Medical): Not on file  . Lack of Transportation (Non-Medical): Not on file  Physical Activity:   . Days of Exercise per Week: Not on file  . Minutes of Exercise per Session: Not on file  Stress:   . Feeling of Stress : Not on file  Social Connections:   . Frequency of Communication with Friends  and Family: Not on file  . Frequency of Social Gatherings with Friends and Family: Not on file  . Attends Religious Services: Not on file  . Active Member of Clubs or Organizations: Not on file  . Attends Archivist Meetings: Not on file  . Marital Status: Not on file  Intimate Partner Violence:   . Fear of Current or Ex-Partner: Not on file  . Emotionally Abused: Not on file  . Physically Abused: Not on file  . Sexually Abused: Not on file   Family History  Problem Relation Age of Onset  . Asthma Mother   . Arthritis Mother   . Pulmonary fibrosis Father   . Hyperlipidemia Father   . Hypertension Father     OBJECTIVE:  Vitals:   09/12/19 1140  BP: 128/89  Pulse: 63  Resp: 20  Temp: 98.1 F (36.7 C)  SpO2: 95%     General appearance: alert; appears fatigued, but nontoxic; speaking in full sentences and tolerating own secretions HEENT: NCAT; Ears: EACs clear, TMs pearly gray; Eyes: PERRL.  EOM grossly intact. Sinuses: nontender; Nose: nares patent without rhinorrhea, Throat: oropharynx clear, tonsils non erythematous or enlarged, uvula midline  Neck: supple without LAD Lungs: unlabored respirations, symmetrical air entry; cough: moderate; no respiratory distress; CTAB Heart: regular rate and rhythm.  Radial pulses 2+ symmetrical bilaterally Skin: warm and dry Psychological: alert and cooperative; normal mood and affect  LABS:  No results found for this or any previous visit (from the past 24 hour(s)).   ASSESSMENT & PLAN:  1. Viral URI   2. Encounter for screening for COVID-19     Meds ordered this encounter  Medications  . benzonatate (TESSALON) 100 MG capsule    Sig: Take 1 capsule (100 mg total) by mouth every 8 (eight) hours.    Dispense:  30 capsule    Refill:  0    Discharge Instructions    COVID testing ordered.  It will take between 2-7 days for test results.  Someone will contact you regarding abnormal results.    In the meantime: You  should remain isolated in your home for 10 days from symptom onset AND greater than 24 hours after symptoms resolution (absence of fever without the use of fever-reducing medication and improvement in respiratory symptoms), whichever is longer Get plenty of rest and push fluids Tessalon Perles prescribed for cough Continue to use Zyrtec, Mucinex as prescribed Use medications daily for symptom relief Use OTC medications like ibuprofen or tylenol as needed fever  or pain Call or go to the ED if you have any new or worsening symptoms such as fever, worsening cough, shortness of breath, chest tightness, chest pain, turning blue, changes in mental status, etc...   Reviewed expectations re: course of current medical issues. Questions answered. Outlined signs and symptoms indicating need for more acute intervention. Patient verbalized understanding. After Visit Summary given.      Note: This document was prepared using Dragon voice recognition software and may include unintentional dictation errors.    Emerson Monte, FNP 09/12/19 1220

## 2019-09-12 NOTE — Discharge Instructions (Signed)
COVID testing ordered.  It will take between 2-7 days for test results.  Someone will contact you regarding abnormal results.    In the meantime: You should remain isolated in your home for 10 days from symptom onset AND greater than 24 hours after symptoms resolution (absence of fever without the use of fever-reducing medication and improvement in respiratory symptoms), whichever is longer Get plenty of rest and push fluids Tessalon Perles prescribed for cough Continue to use Zyrtec, Mucinex as prescribed Use medications daily for symptom relief Use OTC medications like ibuprofen or tylenol as needed fever or pain Call or go to the ED if you have any new or worsening symptoms such as fever, worsening cough, shortness of breath, chest tightness, chest pain, turning blue, changes in mental status, etc..

## 2019-09-12 NOTE — ED Triage Notes (Signed)
Pt presents with nasal congestion , body aches and fatigue since friday

## 2019-09-13 ENCOUNTER — Telehealth: Payer: Self-pay | Admitting: *Deleted

## 2019-09-13 NOTE — Telephone Encounter (Signed)
Called and discussed patient starting Xembify for [facility]. I will work on getting patient approval and be in touch with her

## 2019-09-14 LAB — SARS-COV-2, NAA 2 DAY TAT

## 2019-09-14 LAB — NOVEL CORONAVIRUS, NAA: SARS-CoV-2, NAA: NOT DETECTED

## 2019-09-15 ENCOUNTER — Other Ambulatory Visit: Payer: Self-pay

## 2019-09-15 ENCOUNTER — Ambulatory Visit
Admission: RE | Admit: 2019-09-15 | Discharge: 2019-09-15 | Disposition: A | Discharge status: Home / Self Care | Payer: No Typology Code available for payment source | Source: Ambulatory Visit | Attending: Emergency Medicine | Admitting: Emergency Medicine

## 2019-09-15 VITALS — BP 123/87 | HR 71 | Temp 98.6°F | Resp 20

## 2019-09-15 DIAGNOSIS — J0140 Acute pansinusitis, unspecified: Secondary | ICD-10-CM

## 2019-09-15 DIAGNOSIS — R0981 Nasal congestion: Secondary | ICD-10-CM

## 2019-09-15 MED ORDER — METHYLPREDNISOLONE SODIUM SUCC 125 MG IJ SOLR
125.0000 mg | Freq: Once | INTRAMUSCULAR | Status: AC
  Administered 2019-09-15: 125 mg via INTRAMUSCULAR

## 2019-09-15 MED ORDER — DOXYCYCLINE HYCLATE 100 MG PO CAPS: 100.0000 mg | ORAL_CAPSULE | Freq: Two times a day (BID) | ORAL | 0 refills | Status: DC

## 2019-09-15 NOTE — Discharge Instructions (Addendum)
Has had COVID testing through health at work Get plenty of rest and push fluids Doxycycline prescribed.  Take as directed and to completion Use OTC zyrtec for nasal congestion, runny nose, and/or sore throat Use OTC flonase for nasal congestion and runny nose Use medications daily for symptom relief Use OTC medications like ibuprofen or tylenol as needed fever or pain Call or go to the ED if you have any new or worsening symptoms such as fever, cough, shortness of breath, chest tightness, chest pain, turning blue, changes in mental status, etc..Marland Kitchen

## 2019-09-15 NOTE — ED Provider Notes (Signed)
[place]   [id number] 09/15/19 Arrival Time: 0539   CC: Sinus symptoms  SUBJECTIVE: History from: patient.  Pamela Waller is a 54 y.o. female who presents with ear pain/ pressure, sinus pain/ pressure, PND, sore throat, and mild cough x 6 days.  Daughter tested positive for COVID.  Patient works for [employer] and has had several negative COVID test.  Received COVID vaccines.  Denies alleviating or aggravating factors.  Reports previous symptoms in the past.   Denies fever, chills, SOB, wheezing, chest pain, nausea, changes in bowel or bladder habits.    ROS: As per HPI.  All other pertinent ROS negative.     Past Medical History:  Diagnosis Date  . Chronic sinusitis 12/23/2018  . Endometriosis   . Hyperlipidemia   . Hypertension   . Hypogammaglobulinemia ([place])    sub class 1 and 3 deficiency  . Panic attacks   . Sinusitis   . Vitamin B12 deficiency 12/23/2018  . Vitamin D deficiency 12/23/2018   Past Surgical History:  Procedure Laterality Date  . ABDOMINAL HYSTERECTOMY    . BLADDER SURGERY    . BREAST LUMPECTOMY    . CESAREAN SECTION    . KNEE SURGERY    . NASAL SEPTUM SURGERY    . SINUSOTOMY     Allergies  Allergen Reactions  . Dilaudid [Hydromorphone Hcl] Anaphylaxis  . Morphine And Related Anaphylaxis  . Codeine   . Penicillins Hives  . Prednisone     psychosis  . Prozac [Fluoxetine Hcl]    No current facility-administered medications on file prior to encounter.   Current Outpatient Medications on File Prior to Encounter  Medication Sig Dispense Refill  . aspirin 81 MG chewable tablet Chew by mouth daily.    . Azelastine-Fluticasone 137-50 MCG/ACT SUSP Place 1 spray into the nose in the morning and at bedtime. 23 g 5  . benzonatate (TESSALON) 100 MG capsule Take 1 capsule (100 mg total) by mouth every 8 (eight) hours. 30 capsule 0  . cholecalciferol (VITAMIN D3) 25 MCG (1000 UT) tablet Take 4,000 Units by mouth daily.     . clonazePAM (KLONOPIN)  0.5 MG tablet Take 0.5 mg by mouth 3 (three) times daily as needed for anxiety.    . cyproheptadine (PERIACTIN) 4 MG tablet 1/2-1 tablet at bedtime 30 tablet 0  . famotidine (PEPCID) 40 MG tablet Take 1 tablet (40 mg total) by mouth every evening. 30 tablet 5  . hydrochlorothiazide (MICROZIDE) 12.5 MG capsule Take 1 capsule (12.5 mg total) by mouth daily. 90 capsule 3  . loratadine (CLARITIN) 10 MG tablet Take 10 mg by mouth daily.    . pantoprazole (PROTONIX) 40 MG tablet Take 1 tablet (40 mg total) by mouth in the morning. 30 tablet 5  . propranolol ER (INDERAL LA) 60 MG 24 hr capsule Take 1 capsule (60 mg total) by mouth daily. 90 capsule 3  . rosuvastatin (CRESTOR) 20 MG tablet Take 1 tablet (20 mg total) by mouth daily. 90 tablet 2  . sertraline (ZOLOFT) 100 MG tablet Take 200 mg by mouth daily.     Social History   Socioeconomic History  . Marital status: Widowed    Spouse name: Not on file  . Number of children: Not on file  . Years of education: Not on file  . Highest education level: Not on file  Occupational History  . Not on file  Tobacco Use  . Smoking status: Current Every Day Smoker  Packs/day: 0.50    Years: 28.00    Pack years: 14.00  . Smokeless tobacco: Never Used  Substance and Sexual Activity  . Alcohol use: Never  . Drug use: Never  . Sexual activity: Not on file  Other Topics Concern  . Not on file  Social History Narrative  . Not on file   Social Determinants of Health   Financial Resource Strain:   . Difficulty of Paying Living Expenses: Not on file  Food Insecurity:   . Worried About Charity fundraiser in the Last Year: Not on file  . Ran Out of Food in the Last Year: Not on file  Transportation Needs:   . Lack of Transportation (Medical): Not on file  . Lack of Transportation (Non-Medical): Not on file  Physical Activity:   . Days of Exercise per Week: Not on file  . Minutes of Exercise per Session: Not on file  Stress:   . Feeling of  Stress : Not on file  Social Connections:   . Frequency of Communication with Friends and Family: Not on file  . Frequency of Social Gatherings with Friends and Family: Not on file  . Attends Religious Services: Not on file  . Active Member of Clubs or Organizations: Not on file  . Attends Archivist Meetings: Not on file  . Marital Status: Not on file  Intimate Partner Violence:   . Fear of Current or Ex-Partner: Not on file  . Emotionally Abused: Not on file  . Physically Abused: Not on file  . Sexually Abused: Not on file   Family History  Problem Relation Age of Onset  . Asthma Mother   . Arthritis Mother   . Pulmonary fibrosis Father   . Hyperlipidemia Father   . Hypertension Father     OBJECTIVE:  Vitals:   09/15/19 1130  BP: 123/87  Pulse: 71  Resp: 20  Temp: 98.6 F (37 C)  SpO2: 96%     General appearance: alert; appears fatigued, but nontoxic; speaking in full sentences and tolerating own secretions HEENT: NCAT; Ears: EACs clear, TMs pearly gray; Eyes: PERRL.  EOM grossly intact. Sinuses: TTP; Nose: nares patent without rhinorrhea, Throat: oropharynx clear, tonsils non erythematous or enlarged, uvula midline  Neck: supple without LAD Lungs: unlabored respirations, symmetrical air entry; cough: absent; no respiratory distress; CTAB Heart: regular rate and rhythm.   Skin: warm and dry Psychological: alert and cooperative; normal mood and affect  ASSESSMENT & PLAN:  1. Nasal congestion   2. Acute non-recurrent pansinusitis     Meds ordered this encounter  Medications  . doxycycline (VIBRAMYCIN) 100 MG capsule    Sig: Take 1 capsule (100 mg total) by mouth 2 (two) times daily.    Dispense:  20 capsule    Refill:  0    Order Specific Question:   Supervising Provider    Answer:   Raylene Everts [[id number]]   Has had COVID testing through health at work Get plenty of rest and push fluids Doxycycline prescribed.  Take as directed and to  completion Use OTC zyrtec for nasal congestion, runny nose, and/or sore throat Use OTC flonase for nasal congestion and runny nose Use medications daily for symptom relief Use OTC medications like ibuprofen or tylenol as needed fever or pain Call or go to the ED if you have any new or worsening symptoms such as fever, cough, shortness of breath, chest tightness, chest pain, turning blue, changes in mental status, etc..Marland Kitchen  Reviewed expectations re: course of current medical issues. Questions answered. Outlined signs and symptoms indicating need for more acute intervention. Patient verbalized understanding. After Visit Summary given.         Lestine Box, PA-C 09/15/19 1221

## 2019-09-15 NOTE — ED Triage Notes (Signed)
Pt symptoms have worsened covid negative

## 2019-09-22 MED FILL — DOXYCYCLINE HYCLATE 100 MG: 100 | 10 days supply | Qty: 20 | Fill #0

## 2019-09-22 MED FILL — PROPRANOLOL HCL ER 60 MG CP: 60 | 90 days supply | Qty: 90 | Fill #1

## 2019-09-22 MED FILL — ROSUVASTATIN CALCIUM 20 MG: 20 | 90 days supply | Qty: 90 | Fill #1

## 2019-09-27 ENCOUNTER — Ambulatory Visit: Payer: No Typology Code available for payment source | Admitting: Nurse Practitioner

## 2019-09-28 ENCOUNTER — Other Ambulatory Visit: Payer: Self-pay | Admitting: *Deleted

## 2019-09-28 MED ORDER — XEMBIFY 10 GM/50ML ~~LOC~~ SOLN: 20.0000 g | SUBCUTANEOUS | 11 refills | Status: DC

## 2019-10-03 ENCOUNTER — Other Ambulatory Visit: Payer: Self-pay | Admitting: *Deleted

## 2019-10-03 MED ORDER — ONDANSETRON HCL 4 MG PO TABS: 4.0000 mg | ORAL_TABLET | Freq: Three times a day (TID) | ORAL | 1 refills | Status: DC | PRN

## 2019-10-07 MED FILL — HYDROCHLOROTHIAZIDE 12.5 MG: 12.5 | 90 days supply | Qty: 90 | Fill #1

## 2019-10-07 MED FILL — clonazePAM 0.5 MG TABS: 0.5 | 30 days supply | Qty: 90 | Fill #2

## 2019-10-07 MED FILL — SERTRALINE HCL 100 MG TABS: 100 | 90 days supply | Qty: 180 | Fill #1

## 2019-10-14 ENCOUNTER — Encounter: Payer: Self-pay | Admitting: Allergy and Immunology

## 2019-10-20 ENCOUNTER — Other Ambulatory Visit: Payer: Self-pay | Admitting: Internal Medicine

## 2019-10-20 DIAGNOSIS — Z1231 Encounter for screening mammogram for malignant neoplasm of breast: Secondary | ICD-10-CM

## 2019-10-31 ENCOUNTER — Other Ambulatory Visit: Payer: Self-pay | Admitting: Allergy and Immunology

## 2019-11-14 ENCOUNTER — Other Ambulatory Visit (HOSPITAL_BASED_OUTPATIENT_CLINIC_OR_DEPARTMENT_OTHER): Payer: Self-pay | Admitting: Internal Medicine

## 2019-11-14 MED FILL — clonazePAM 0.5 MG TABS: 0.5 | 30 days supply | Qty: 90 | Fill #0

## 2019-11-14 MED FILL — FLUARIX QUADRIVALENT 0.5 ML: 0.5 | 1 days supply | Qty: 1 | Fill #0

## 2019-11-24 ENCOUNTER — Other Ambulatory Visit: Payer: Self-pay | Admitting: Allergy and Immunology

## 2019-11-24 NOTE — Telephone Encounter (Signed)
Dr. Neldon Mc is it necessary for the patient to have refills on Ondansetron (Zofran) 4mg ? 10 tablets were prescribed with 1 refill and both has been filled at this point. The pharmacy sent a request for a refill.

## 2019-12-02 ENCOUNTER — Encounter: Payer: Self-pay | Admitting: Allergy and Immunology

## 2019-12-16 ENCOUNTER — Ambulatory Visit (INDEPENDENT_AMBULATORY_CARE_PROVIDER_SITE_OTHER): Payer: No Typology Code available for payment source

## 2019-12-16 ENCOUNTER — Other Ambulatory Visit: Payer: Self-pay

## 2019-12-16 ENCOUNTER — Ambulatory Visit
Admission: RE | Admit: 2019-12-16 | Discharge: 2019-12-16 | Disposition: A | Discharge status: Home / Self Care | Payer: No Typology Code available for payment source | Source: Ambulatory Visit | Attending: Family Medicine | Admitting: Family Medicine

## 2019-12-16 VITALS — BP 154/98 | HR 61 | Temp 97.9°F | Resp 17 | Ht 60.0 in | Wt 157.0 lb

## 2019-12-16 DIAGNOSIS — T148XXA Other injury of unspecified body region, initial encounter: Secondary | ICD-10-CM | POA: Diagnosis not present

## 2019-12-16 DIAGNOSIS — S161XXA Strain of muscle, fascia and tendon at neck level, initial encounter: Secondary | ICD-10-CM

## 2019-12-16 DIAGNOSIS — M25512 Pain in left shoulder: Secondary | ICD-10-CM

## 2019-12-16 MED ORDER — METHYLPREDNISOLONE SODIUM SUCC 125 MG IJ SOLR
125.0000 mg | Freq: Once | INTRAMUSCULAR | Status: AC
  Administered 2019-12-16: 125 mg via INTRAMUSCULAR

## 2019-12-16 MED ORDER — KETOROLAC TROMETHAMINE 30 MG/ML IJ SOLN
30.0000 mg | Freq: Once | INTRAMUSCULAR | Status: AC
  Administered 2019-12-16: 30 mg via INTRAMUSCULAR

## 2019-12-16 MED ORDER — KETOROLAC TROMETHAMINE 10 MG PO TABS: 10.0000 mg | ORAL_TABLET | Freq: Four times a day (QID) | ORAL | 0 refills | Status: DC | PRN

## 2019-12-16 NOTE — ED Triage Notes (Signed)
Constant pain to LT shoulder blade that is worse with certain movements.  Started x 3 days ago. Cannot recall any injury.

## 2019-12-16 NOTE — Discharge Instructions (Addendum)
You have received toradol and solumedrol in the office today  I have sent in toradol for you to take at home as well  Xray was negative today  Follow up with this office or with primary care if symptoms are persisting.  Follow up in the ER for high fever, trouble swallowing, trouble breathing, other concerning symptoms.

## 2019-12-16 NOTE — ED Provider Notes (Signed)
[place]   [id number] 12/16/19 Arrival Time: 1123  EH:UDJSH PAIN  SUBJECTIVE: History from: patient. Pamela Waller is a 54 y.o. female complains of left shoulder blade pain that is pretty constant.  Reports that the pain is worse with certain movements.  Began about 3 days ago.  No known injury. Has tried OTC medications without relief.  Symptoms are made worse with activity. Denies similar symptoms in the past. Denies fever, chills, erythema, ecchymosis, effusion, weakness, numbness and tingling, saddle paresthesias, loss of bowel or bladder function.      ROS: As per HPI.  All other pertinent ROS negative.     Past Medical History:  Diagnosis Date  . Chronic sinusitis 12/23/2018  . Endometriosis   . Hyperlipidemia   . Hypertension   . Hypogammaglobulinemia ([place])    sub class 1 and 3 deficiency  . Panic attacks   . Sinusitis   . Vitamin B12 deficiency 12/23/2018  . Vitamin D deficiency 12/23/2018   Past Surgical History:  Procedure Laterality Date  . ABDOMINAL HYSTERECTOMY    . BLADDER SURGERY    . BREAST LUMPECTOMY    . CESAREAN SECTION    . KNEE SURGERY    . NASAL SEPTUM SURGERY    . SINUSOTOMY     Allergies  Allergen Reactions  . Dilaudid [Hydromorphone Hcl] Anaphylaxis  . Morphine And Related Anaphylaxis  . Codeine   . Penicillins Hives  . Percocet [Oxycodone-Acetaminophen] Nausea Only  . Prednisone     psychosis  . Prozac [Fluoxetine Hcl]    No current facility-administered medications on file prior to encounter.   Current Outpatient Medications on File Prior to Encounter  Medication Sig Dispense Refill  . aspirin 81 MG chewable tablet Chew by mouth daily.    . Azelastine-Fluticasone 137-50 MCG/ACT SUSP Place 1 spray into the nose in the morning and at bedtime. 23 g 5  . benzonatate (TESSALON) 100 MG capsule Take 1 capsule (100 mg total) by mouth every 8 (eight) hours. 30 capsule 0  . cholecalciferol (VITAMIN D3) 25 MCG (1000 UT) tablet Take  4,000 Units by mouth daily.     . clonazePAM (KLONOPIN) 0.5 MG tablet Take 0.5 mg by mouth 3 (three) times daily as needed for anxiety.    . cyproheptadine (PERIACTIN) 4 MG tablet 1/2-1 tablet at bedtime 30 tablet 0  . doxycycline (VIBRAMYCIN) 100 MG capsule Take 1 capsule (100 mg total) by mouth 2 (two) times daily. 20 capsule 0  . famotidine (PEPCID) 40 MG tablet Take 1 tablet (40 mg total) by mouth every evening. 30 tablet 5  . hydrochlorothiazide (MICROZIDE) 12.5 MG capsule Take 1 capsule (12.5 mg total) by mouth daily. 90 capsule 3  . Immune Globulin, Human,-klhw (XEMBIFY) 10 GM/50ML SOLN Inject 20 g into the skin every 14 (fourteen) days. 200 mL 11  . loratadine (CLARITIN) 10 MG tablet Take 10 mg by mouth daily.    . ondansetron (ZOFRAN) 4 MG tablet TAKE ONE TABLET BY MOUTH EVERY 8 HOURS AS NEEDED FOR NAUSEA/VOMITING 10 tablet 1  . pantoprazole (PROTONIX) 40 MG tablet Take 1 tablet (40 mg total) by mouth in the morning. 30 tablet 5  . propranolol ER (INDERAL LA) 60 MG 24 hr capsule Take 1 capsule (60 mg total) by mouth daily. 90 capsule 3  . rosuvastatin (CRESTOR) 20 MG tablet Take 1 tablet (20 mg total) by mouth daily. 90 tablet 2  . sertraline (ZOLOFT) 100 MG tablet Take 200 mg by mouth daily.  Social History   Socioeconomic History  . Marital status: Widowed    Spouse name: Not on file  . Number of children: Not on file  . Years of education: Not on file  . Highest education level: Not on file  Occupational History  . Not on file  Tobacco Use  . Smoking status: Current Every Day Smoker    Packs/day: 0.50    Years: 28.00    Pack years: 14.00  . Smokeless tobacco: Never Used  Substance and Sexual Activity  . Alcohol use: Never  . Drug use: Never  . Sexual activity: Not on file  Other Topics Concern  . Not on file  Social History Narrative  . Not on file   Social Determinants of Health   Financial Resource Strain:   . Difficulty of Paying Living Expenses: Not on  file  Food Insecurity:   . Worried About Charity fundraiser in the Last Year: Not on file  . Ran Out of Food in the Last Year: Not on file  Transportation Needs:   . Lack of Transportation (Medical): Not on file  . Lack of Transportation (Non-Medical): Not on file  Physical Activity:   . Days of Exercise per Week: Not on file  . Minutes of Exercise per Session: Not on file  Stress:   . Feeling of Stress : Not on file  Social Connections:   . Frequency of Communication with Friends and Family: Not on file  . Frequency of Social Gatherings with Friends and Family: Not on file  . Attends Religious Services: Not on file  . Active Member of Clubs or Organizations: Not on file  . Attends Archivist Meetings: Not on file  . Marital Status: Not on file  Intimate Partner Violence:   . Fear of Current or Ex-Partner: Not on file  . Emotionally Abused: Not on file  . Physically Abused: Not on file  . Sexually Abused: Not on file   Family History  Problem Relation Age of Onset  . Asthma Mother   . Arthritis Mother   . Pulmonary fibrosis Father   . Hyperlipidemia Father   . Hypertension Father     OBJECTIVE:  Vitals:   12/16/19 1143 12/16/19 1144  BP: (!) 154/98   Pulse: 61   Resp: 17   Temp: 97.9 F (36.6 C)   TempSrc: Oral   SpO2: 95%   Weight:  157 lb (71.2 kg)  Height:  5' (1.524 m)    General appearance: ALERT; in no acute distress.  Head: NCAT Lungs: Normal respiratory effort CV:  pulses 2+ bilaterally. Cap refill < 2 seconds Musculoskeletal:  Inspection: Skin warm, dry, clear and intact without obvious erythema, effusion, or ecchymosis.  Palpation: L trapezius tender to palpation, muscle in spasm ROM: limited ROM active and passive to L neck and shoulder Skin: warm and dry Neurologic: Ambulates without difficulty; Sensation intact about the upper/ lower extremities Psychological: alert and cooperative; normal mood and affect  DIAGNOSTIC STUDIES:  No  results found.   ASSESSMENT & PLAN:  1. Muscle strain   2. Acute pain of left shoulder   3. Cervical strain, acute, initial encounter     Meds ordered this encounter  Medications  . ketorolac (TORADOL) 30 MG/ML injection 30 mg  . methylPREDNISolone sodium succinate (SOLU-MEDROL) 125 mg/2 mL injection 125 mg  . ketorolac (TORADOL) 10 MG tablet    Sig: Take 1 tablet (10 mg total) by mouth every 6 (six) hours  as needed.    Dispense:  20 tablet    Refill:  0    Order Specific Question:   Supervising Provider    Answer:   Chase Picket [id number]   L shoulder xray negative Will treat for muscle strain Toradol 30mg  IM in office today Solumedrol 125mg  IM in office today Prescribed toradol Continue conservative management of rest, ice, and gentle stretches Take toradol as needed for pain relief (may cause abdominal discomfort, ulcers, and GI bleeds avoid taking with other NSAIDs) Declines muscle relaxers today, states that she is very sensitive to medications  Follow up with PCP if symptoms persist Return or go to the ER if you have any new or worsening symptoms (fever, chills, chest pain, abdominal pain, changes in bowel or bladder habits, pain radiating into lower legs)   Reviewed expectations re: course of current medical issues. Questions answered. Outlined signs and symptoms indicating need for more acute intervention. Patient verbalized understanding. After Visit Summary given.       Faustino Congress, NP 12/19/19 1253

## 2019-12-23 ENCOUNTER — Other Ambulatory Visit: Payer: Self-pay | Admitting: Internal Medicine

## 2019-12-23 MED FILL — PROPRANOLOL HCL ER 60 MG CP: 60 | 90 days supply | Qty: 90 | Fill #0

## 2019-12-23 MED FILL — ROSUVASTATIN CALCIUM 20 MG: 20 | 90 days supply | Qty: 90 | Fill #0

## 2019-12-23 NOTE — Telephone Encounter (Signed)
[place], [place] Phone:  [phone]  Fax:  [phone]     Patient calling, she would prefer medications to go to this pharmacy and she is making [place] aware she is completely out of the medication

## 2019-12-23 NOTE — Telephone Encounter (Signed)
Please refill as per office routine med refill policy (all routine meds refilled for 3 mo or monthly per pt preference up to one year from last visit, then month to month grace period for 3 mo, then further med refills will have to be denied)  

## 2019-12-24 ENCOUNTER — Other Ambulatory Visit (HOSPITAL_BASED_OUTPATIENT_CLINIC_OR_DEPARTMENT_OTHER): Payer: Self-pay | Admitting: *Deleted

## 2019-12-24 MED FILL — SERTRALINE HCL 100 MG TABS: 100 | 90 days supply | Qty: 180 | Fill #0

## 2019-12-24 MED FILL — clonazePAM 0.5 MG TABS: 0.5 | 30 days supply | Qty: 90 | Fill #0

## 2019-12-27 ENCOUNTER — Ambulatory Visit (INDEPENDENT_AMBULATORY_CARE_PROVIDER_SITE_OTHER): Payer: No Typology Code available for payment source

## 2019-12-27 ENCOUNTER — Encounter: Payer: Self-pay | Admitting: Internal Medicine

## 2019-12-27 ENCOUNTER — Ambulatory Visit (INDEPENDENT_AMBULATORY_CARE_PROVIDER_SITE_OTHER): Payer: No Typology Code available for payment source | Admitting: Internal Medicine

## 2019-12-27 ENCOUNTER — Other Ambulatory Visit: Payer: Self-pay

## 2019-12-27 VITALS — BP 118/80 | HR 66 | Temp 98.0°F | Ht 60.0 in | Wt 165.0 lb

## 2019-12-27 DIAGNOSIS — Z0000 Encounter for general adult medical examination without abnormal findings: Secondary | ICD-10-CM | POA: Diagnosis not present

## 2019-12-27 DIAGNOSIS — M549 Dorsalgia, unspecified: Secondary | ICD-10-CM | POA: Insufficient documentation

## 2019-12-27 DIAGNOSIS — Z0001 Encounter for general adult medical examination with abnormal findings: Secondary | ICD-10-CM

## 2019-12-27 DIAGNOSIS — E785 Hyperlipidemia, unspecified: Secondary | ICD-10-CM | POA: Diagnosis not present

## 2019-12-27 DIAGNOSIS — F410 Panic disorder [episodic paroxysmal anxiety] without agoraphobia: Secondary | ICD-10-CM

## 2019-12-27 DIAGNOSIS — E538 Deficiency of other specified B group vitamins: Secondary | ICD-10-CM

## 2019-12-27 DIAGNOSIS — R739 Hyperglycemia, unspecified: Secondary | ICD-10-CM | POA: Diagnosis not present

## 2019-12-27 DIAGNOSIS — D801 Nonfamilial hypogammaglobulinemia: Secondary | ICD-10-CM

## 2019-12-27 DIAGNOSIS — E559 Vitamin D deficiency, unspecified: Secondary | ICD-10-CM

## 2019-12-27 DIAGNOSIS — I10 Essential (primary) hypertension: Secondary | ICD-10-CM

## 2019-12-27 HISTORY — DX: Dorsalgia, unspecified: M54.9

## 2019-12-27 LAB — CBC WITH DIFFERENTIAL/PLATELET
Basophils Absolute: 0.1 10*3/uL (ref 0.0–0.1)
Basophils Relative: 0.6 % (ref 0.0–3.0)
Eosinophils Absolute: 0.3 10*3/uL (ref 0.0–0.7)
Eosinophils Relative: 3.5 % (ref 0.0–5.0)
HCT: 44.1 % (ref 36.0–46.0)
Hemoglobin: 15.2 g/dL — ABNORMAL HIGH (ref 12.0–15.0)
Lymphocytes Relative: 27.8 % (ref 12.0–46.0)
Lymphs Abs: 2.4 10*3/uL (ref 0.7–4.0)
MCHC: 34.3 g/dL (ref 30.0–36.0)
MCV: 88.8 fl (ref 78.0–100.0)
Monocytes Absolute: 0.5 10*3/uL (ref 0.1–1.0)
Monocytes Relative: 6.3 % (ref 3.0–12.0)
Neutro Abs: 5.3 10*3/uL (ref 1.4–7.7)
Neutrophils Relative %: 61.8 % (ref 43.0–77.0)
Platelets: 128 10*3/uL — ABNORMAL LOW (ref 150.0–400.0)
RBC: 4.97 Mil/uL (ref 3.87–5.11)
RDW: 13.3 % (ref 11.5–15.5)
WBC: 8.6 10*3/uL (ref 4.0–10.5)

## 2019-12-27 LAB — HEPATIC FUNCTION PANEL
ALT: 16 U/L (ref 0–35)
AST: 16 U/L (ref 0–37)
Albumin: 4.8 g/dL (ref 3.5–5.2)
Alkaline Phosphatase: 71 U/L (ref 39–117)
Bilirubin, Direct: 0.1 mg/dL (ref 0.0–0.3)
Total Bilirubin: 0.7 mg/dL (ref 0.2–1.2)
Total Protein: 7.8 g/dL (ref 6.0–8.3)

## 2019-12-27 LAB — LIPID PANEL
Cholesterol: 205 mg/dL — ABNORMAL HIGH (ref 0–200)
HDL: 47.2 mg/dL (ref >39.00)
LDL Cholesterol: 126 mg/dL — ABNORMAL HIGH (ref 0–99)
NonHDL: 157.95
Total CHOL/HDL Ratio: 4
Triglycerides: 160 mg/dL — ABNORMAL HIGH (ref 0.0–149.0)
VLDL: 32 mg/dL (ref 0.0–40.0)

## 2019-12-27 LAB — BASIC METABOLIC PANEL
BUN: 23 mg/dL (ref 6–23)
CO2: 30 mEq/L (ref 19–32)
Calcium: 10.4 mg/dL (ref 8.4–10.5)
Chloride: 103 mEq/L (ref 96–112)
Creatinine, Ser: 0.82 mg/dL (ref 0.40–1.20)
GFR: 81.04 mL/min (ref >60.00)
Glucose, Bld: 86 mg/dL (ref 70–99)
Potassium: 4.1 mEq/L (ref 3.5–5.1)
Sodium: 141 mEq/L (ref 135–145)

## 2019-12-27 LAB — TSH: TSH: 3.16 u[IU]/mL (ref 0.35–4.50)

## 2019-12-27 LAB — VITAMIN B12: Vitamin B-12: 166 pg/mL — ABNORMAL LOW (ref 211–911)

## 2019-12-27 LAB — URINALYSIS, ROUTINE W REFLEX MICROSCOPIC
Bilirubin Urine: NEGATIVE
Ketones, ur: NEGATIVE
Leukocytes,Ua: NEGATIVE
Nitrite: NEGATIVE
Specific Gravity, Urine: 1.025 (ref 1.000–1.030)
Total Protein, Urine: NEGATIVE
Urine Glucose: NEGATIVE
Urobilinogen, UA: 0.2 (ref 0.0–1.0)
pH: 6 (ref 5.0–8.0)

## 2019-12-27 LAB — HEMOGLOBIN A1C: Hgb A1c MFr Bld: 5.3 % (ref 4.6–6.5)

## 2019-12-27 LAB — SARS-COV-2 IGG: SARS-COV-2 IgG: 2.76

## 2019-12-27 LAB — VITAMIN D 25 HYDROXY (VIT D DEFICIENCY, FRACTURES): VITD: 32.79 ng/mL (ref 30.00–100.00)

## 2019-12-27 NOTE — Progress Notes (Signed)
Subjective:    Patient ID: Pamela Waller, female    DOB: [birth year], 54 y.o.   MRN: [id number]  HPI  Here for wellness and f/u;  Overall doing ok;  Pt denies Chest pain, worsening SOB, DOE, wheezing, orthopnea, PND, worsening LE edema, palpitations, dizziness or syncope.  Pt denies neurological change such as new headache, facial or extremity weakness.  Pt denies polydipsia, polyuria, or low sugar symptoms. Pt states overall good compliance with treatment and medications, good tolerability, and has been trying to follow appropriate diet.  Pt denies worsening depressive symptoms, suicidal ideation or panic. No fever, night sweats, wt loss, loss of appetite, or other constitutional symptoms.  Pt states good ability with ADL's, has low fall risk, home safety reviewed and adequate, no other significant changes in hearing or vision, and only occasionally active with exercise.  Now getting subq infusions every 14 days at home for low gamma globulin.  Also, has had more stress this past yr, parents died both in the past yr, mother with nash cirrhosis, heart valve replacement, hepatic encephalopathy, and MRSA in knee.   Plans to call on her own for mamogram near her workplace at [employer], in a few days.  Has gained 20 lbs in 2 yrs. Wt Readings from Last 3 Encounters:  12/27/19 165 lb (74.8 kg)  12/16/19 157 lb (71.2 kg)  04/19/19 158 lb 9.6 oz (71.9 kg)   BP Readings from Last 3 Encounters:  12/27/19 118/80  12/16/19 (!) 154/98  09/15/19 123/87  Also c/o 2 wks onset left upper back pain, sharp and dull, mod, intermittent, not really worse or better with anything.  Seen at [facility] and suggested to have Ct chest Past Medical History:  Diagnosis Date  . Chronic sinusitis 12/23/2018  . Endometriosis   . Hyperlipidemia   . Hypertension   . Hypogammaglobulinemia ([place])    sub class 1 and 3 deficiency  . Panic attacks   . Sinusitis   . Vitamin B12 deficiency 12/23/2018  . Vitamin D  deficiency 12/23/2018   Past Surgical History:  Procedure Laterality Date  . ABDOMINAL HYSTERECTOMY    . BLADDER SURGERY    . BREAST LUMPECTOMY    . CESAREAN SECTION    . KNEE SURGERY    . NASAL SEPTUM SURGERY    . SINUSOTOMY      reports that she has been smoking. She has a 14.00 pack-year smoking history. She has never used smokeless tobacco. She reports that she does not drink alcohol and does not use drugs. family history includes Arthritis in her mother; Asthma in her mother; Breast cancer in her mother and sister; Hyperlipidemia in her father; Hypertension in her father; Pulmonary fibrosis in her father. Allergies  Allergen Reactions  . Dilaudid [Hydromorphone Hcl] Anaphylaxis  . Morphine And Related Anaphylaxis  . Codeine   . Penicillins Hives  . Percocet [Oxycodone-Acetaminophen] Nausea Only  . Prednisone     psychosis  . Prozac [Fluoxetine Hcl]    Current Outpatient Medications on File Prior to Visit  Medication Sig Dispense Refill  . aspirin 81 MG chewable tablet Chew by mouth daily.    . cetirizine (ZYRTEC ALLERGY) 10 MG tablet Zyrtec 10 mg tablet  Take 1 tablet every day by oral route at bedtime.    . cholecalciferol (VITAMIN D3) 25 MCG (1000 UT) tablet Take 4,000 Units by mouth daily.     . clonazePAM (KLONOPIN) 0.5 MG tablet Take 0.5 mg by mouth 3 (  three) times daily as needed for anxiety.    . cyanocobalamin (,VITAMIN B-12,) 1000 MCG/ML injection cyanocobalamin (vit B-12) 1,000 mcg/mL injection solution  INJECT 1 ML INTRAMUSCULARLY EVERY MONTH    . EPINEPHrine 0.3 mg/0.3 mL IJ SOAJ injection Inject into the muscle.    . ibuprofen (ADVIL) 800 MG tablet ibuprofen 800 mg tablet    . Immune Globulin, Human,-klhw (XEMBIFY) 10 GM/50ML SOLN Inject 20 g into the skin every 14 (fourteen) days. 200 mL 11  . propranolol ER (INDERAL LA) 60 MG 24 hr capsule TAKE 1 CAPSULE BY MOUTH ONCE DAILY. 90 capsule 0  . rosuvastatin (CRESTOR) 20 MG tablet TAKE 1 TABLET BY MOUTH DAILY. 90  tablet 0  . sertraline (ZOLOFT) 100 MG tablet Take 200 mg by mouth daily.    . traZODone (DESYREL) 100 MG tablet trazodone 100 mg tablet    . hydrochlorothiazide (MICROZIDE) 12.5 MG capsule Take 1 capsule (12.5 mg total) by mouth daily. 90 capsule 3   No current facility-administered medications on file prior to visit.   Review of Systems All otherwise neg per pt    Objective:   Physical Exam BP 118/80 (BP Location: Left Arm, Patient Position: Sitting, Cuff Size: Large)   Pulse 66   Temp 98 F (36.7 C) (Oral)   Ht 5' (1.524 m)   Wt 165 lb (74.8 kg)   SpO2 94%   BMI 32.22 kg/m  VS noted,  Constitutional: Pt appears in NAD HENT: Head: NCAT.  Right Ear: External ear normal.  Left Ear: External ear normal.  Eyes: . Pupils are equal, round, and reactive to light. Conjunctivae and EOM are normal Nose: without d/c or deformity Neck: Neck supple. Gross normal ROM Cardiovascular: Normal rate and regular rhythm.   Pulmonary/Chest: Effort normal and breath sounds without rales or wheezing.  Abd:  Soft, NT, ND, + BS, no organomegaly Neurological: Pt is alert. At baseline orientation, motor grossly intact Skin: Skin is warm. No rashes, other new lesions, no LE edema Psychiatric: Pt behavior is normal without agitation  All otherwise neg per pt Lab Results  Component Value Date   WBC 8.6 12/27/2019   HGB 15.2 (H) 12/27/2019   HCT 44.1 12/27/2019   PLT 128.0 (L) 12/27/2019   GLUCOSE 86 12/27/2019   CHOL 205 (H) 12/27/2019   TRIG 160.0 (H) 12/27/2019   HDL 47.20 12/27/2019   LDLCALC 126 (H) 12/27/2019   ALT 16 12/27/2019   AST 16 12/27/2019   NA 141 12/27/2019   K 4.1 12/27/2019   CL 103 12/27/2019   CREATININE 0.82 12/27/2019   BUN 23 12/27/2019   CO2 30 12/27/2019   TSH 3.16 12/27/2019   HGBA1C 5.3 12/27/2019      Assessment & Plan:

## 2019-12-27 NOTE — Patient Instructions (Signed)
Please make appt with Sports Medicine on the first floor for the left upper back pain  Please continue all other medications as before, and refills have been done if requested.  Please have the pharmacy call with any other refills you may need.  Please continue your efforts at being more active, low cholesterol diet, and weight control.  You are otherwise up to date with prevention measures today.  Please keep your appointments with your specialists as you may have planned  Please go to the XRAY Department in the first floor for the x-ray testing  Please go to the LAB at the blood drawing area for the tests to be done  You will be contacted by phone if any changes need to be made immediately.  Otherwise, you will receive a letter about your results with an explanation, but please check with MyChart first.  Please remember to sign up for MyChart if you have not done so, as this will be important to you in the future with finding out test results, communicating by private email, and scheduling acute appointments online when needed.  Please make an Appointment to return for your 1 year visit, or sooner if needed

## 2019-12-28 ENCOUNTER — Encounter: Payer: Self-pay | Admitting: Internal Medicine

## 2019-12-28 ENCOUNTER — Other Ambulatory Visit: Payer: Self-pay | Admitting: Internal Medicine

## 2019-12-28 MED ORDER — VITAMIN B-12 1000 MCG PO TABS: 1000.0000 ug | ORAL_TABLET | Freq: Every day | ORAL | 3 refills | Status: DC

## 2019-12-28 MED FILL — SERTRALINE HCL 100 MG TABS: 100 | 90 days supply | Qty: 180 | Fill #0

## 2019-12-30 ENCOUNTER — Ambulatory Visit
Admission: RE | Admit: 2019-12-30 | Discharge: 2019-12-30 | Disposition: A | Discharge status: Home / Self Care | Payer: No Typology Code available for payment source | Source: Ambulatory Visit | Attending: Internal Medicine | Admitting: Internal Medicine

## 2019-12-30 ENCOUNTER — Other Ambulatory Visit: Payer: Self-pay

## 2019-12-30 ENCOUNTER — Encounter: Payer: Self-pay | Admitting: Allergy and Immunology

## 2019-12-30 DIAGNOSIS — Z1231 Encounter for screening mammogram for malignant neoplasm of breast: Secondary | ICD-10-CM

## 2019-12-31 ENCOUNTER — Encounter: Payer: Self-pay | Admitting: Internal Medicine

## 2019-12-31 NOTE — Assessment & Plan Note (Signed)
stable overall by history and exam, recent data reviewed with pt, and pt to continue medical treatment as before,  to f/u any worsening symptoms or concerns  

## 2019-12-31 NOTE — Assessment & Plan Note (Signed)
For b12 oral replacement 

## 2019-12-31 NOTE — Assessment & Plan Note (Signed)

## 2019-12-31 NOTE — Assessment & Plan Note (Signed)
For med refill,  to f/u any worsening symptoms or concerns

## 2019-12-31 NOTE — Assessment & Plan Note (Addendum)
For cxr, likely msk, for sport med f/u if not improved  I spent 31 minutes in addition to time for CPX wellness examination in preparing to see the patient by review of recent labs, imaging and procedures, obtaining and reviewing separately obtained history, communicating with the patient and family or caregiver, ordering medications, tests or procedures, and documenting clinical information in the EHR including the differential Dx, treatment, and any further evaluation and other management of upper back pain, hypogammaglobunemia, vit d and b12 def, panic, hld, htn,

## 2019-12-31 NOTE — Assessment & Plan Note (Signed)
Cont oral replacement 

## 2019-12-31 NOTE — Assessment & Plan Note (Signed)
For sar covid igg per pt request

## 2020-01-18 ENCOUNTER — Encounter: Payer: Self-pay | Admitting: Internal Medicine

## 2020-01-19 ENCOUNTER — Other Ambulatory Visit: Payer: Self-pay | Admitting: Internal Medicine

## 2020-01-19 MED ORDER — LEVOFLOXACIN 500 MG PO TABS: 500.0000 mg | ORAL_TABLET | Freq: Every day | ORAL | 0 refills | Status: DC

## 2020-01-19 MED FILL — levoFLOXacin 500 MG TABS: 500 | 10 days supply | Qty: 10 | Fill #0

## 2020-02-07 MED FILL — clonazePAM 0.5 MG TABS: 0.5 | 30 days supply | Qty: 90 | Fill #0

## 2020-02-08 ENCOUNTER — Telehealth: Payer: Self-pay

## 2020-02-08 NOTE — Telephone Encounter (Signed)
Patient informed. 

## 2020-02-08 NOTE — Telephone Encounter (Signed)
Please tell Deoni that she should definitely get her Xembify infusion completed

## 2020-02-08 NOTE — Telephone Encounter (Signed)
Patient called and said she tested positive for COVID last Thursday.  She was sent back to work yesterday by [employer].  However, she still feels bad, with fever today and is drowning in snot draining back into her throat, some coughing with phlegm, diarrhea, stomach pain, headache.  She is due for her Xembify infusion this Friday and wanted to know if she can still get the infusion due to her symptoms.  Please advise.

## 2020-02-13 ENCOUNTER — Telehealth: Payer: Self-pay | Admitting: Allergy

## 2020-02-13 ENCOUNTER — Ambulatory Visit (INDEPENDENT_AMBULATORY_CARE_PROVIDER_SITE_OTHER): Payer: No Typology Code available for payment source | Admitting: Allergy

## 2020-02-13 ENCOUNTER — Other Ambulatory Visit: Payer: Self-pay

## 2020-02-13 ENCOUNTER — Encounter: Payer: Self-pay | Admitting: Allergy

## 2020-02-13 DIAGNOSIS — U071 COVID-19: Secondary | ICD-10-CM | POA: Diagnosis not present

## 2020-02-13 NOTE — Telephone Encounter (Signed)
Patient had a televisit with Dr. Nelva Bush today. She had been positive for Covid and [employer] would not release her back to work without speaking to the doctor. She said she had a letter sent to her MyChart but it did not state she could go back to work tomorrow. She would like a new letter stating this.

## 2020-02-13 NOTE — Telephone Encounter (Signed)
Right.  I told pt I could write a letter to excuse her from work for today since she is still symptomatic today but I could only do it for today as I do not know how long she will remain symptomatic to put an end date on such a letter.   Will need to coordinate clearance and return to work with [place], her employer and/or her PCP.    Pt is a pt of Dr. Neldon Mc

## 2020-02-13 NOTE — Telephone Encounter (Signed)
PLEASE ADVISE TO NEW LETTER

## 2020-02-13 NOTE — Patient Instructions (Addendum)
COVID-19 illness -She currently has COVID-19 illness with positive test on 02/02/2019 -She remains symptomatic at this time with ongoing GI symptoms as well as postnasal drainage, headache and fatigue -Discussed with patient did not feel she should return back to work with continued GI symptoms. -I agreed with her that she should be able to return to work once her GI symptoms have resolved and her drainage, headache and fatigue have improved and to return back to her baseline.  However recommended that this decision to return back to work should be decided upon between [place] and her employer. -I provided with a letter today to excuse her from work today 02/13/2020 due to ongoing symptoms -Continue to maintain adequate hydration and rest -Do not feel that retesting will be helpful here as you can remain positive for quite some time despite symptom resolution   Continue current recommendations per Dr. Neldon Mc: 1.  Use nicotine replacements to eliminate tobacco smoke exposure.   2.  Eliminate use of over-the-counter nasal decongestant sprays 3.  Treat and prevent inflammation:   A.  Dymista-1 spray each nostril twice a day 4.  Treat and prevent reflux:   A. Consolidate caffeine consumption  B.  Pantoprazole 40 mg - 1 tablet in a.m.  C.  Famotidine 40 mg - 1 tablet in p.m. 5. Treat and prevent headache:   A. Consolidate caffeine consumption  B.  Periactin 4 mg - 1/2-1 tablet at bedtime (clonazepam?) 6. If needed:   A. Nasal saline wash  B. Loratadine 10 mg - 1-2 tablet 1 time per day 7.  Have not done so thus far -- obtain Pneumovax and repeat Pneumo 23 antibody titer study in 6 weeks after immunization 8.  Return to clinic in 8 weeks or earlier if problem

## 2020-02-13 NOTE — Progress Notes (Signed)
RE: Pamela Waller MRN: [id number] DOB: Nov 28, 1965 Date of Telemedicine Visit: 02/13/2020  Referring provider: Biagio Borg, MD Primary care provider: Biagio Borg, MD  Chief Complaint: Other (Patient had a positive COVID test and is still having symptoms post 10 day quarantine. She is still very stuffy, having diarrhea, nausea, PND, fatigue, and burning in the back of throat and nose. )   Telemedicine Follow Up Visit via Telephone: I connected with Azizah Lisle for a follow up on 02/13/20 by telephone and verified that I am speaking with the correct person using two identifiers.   I discussed the limitations, risks, security and privacy concerns of performing an evaluation and management service by telephone and the availability of in person appointments. I also discussed with the patient that there may be a patient responsible charge related to this service. The patient expressed understanding and agreed to proceed.  Patient is at home.   Provider is at the office.  Visit start time: 1010 Visit end time: [place] consent/check in by: [facility] consent and medical assistant/nurse: Damita  History of Present Illness: She is a 55 y.o. female, who is being followed for hypogammaglobulinemia, chronic sinusitis, migraine, LPRD and history of tobacco use.  She sees primarily Dr. Neldon Mc and her previous allergy office visit was on 03/23/19.  She tested positive to Covid on 02/02/19.   On 02/06/19 she reports that [employer]) stated she met criteria to return to work due to "not having to cough while talking".  She however was out again on 02/08/19.  She states that this time she does not know what to do as [facility] per patient has cleared her to return back to work however she has not been able to stay at work as she still has symptoms. On 02/11/19 she states she may have misunderstood [employer] in stating she needed to have a doctor's visit to return to work or get a leave of absence.   She reports she still has some nausea and is still having diarrhea, "horrific "postnasal drainage, headache and overall fatigue.  She has been afebrile since Friday.  She states with her hypogammaglobulinemia she is on Xembify and always has some degree of postnasal drainage, headache and fatigue.  However she notes that her postnasal drainage headache and fatigue at this time are much worse than normal.  She states she normally does not have any GI issues like diarrhea.  At this time she is not discussed her return to work with her PCP.  Assessment and Plan: Gabrianna is a 55 y.o. female with:   COVID-19 illness -She currently has COVID-19 illness with positive test on 02/02/2019 -She remains symptomatic at this time with ongoing GI symptoms as well as postnasal drainage, headache and fatigue -Discussed with patient did not feel she should return back to work with continued GI symptoms. -I agreed with her that she should be able to return to work once her GI symptoms have resolved and her drainage, headache and fatigue have improved and/or returned back to her baseline.  However recommended that this decision to return back to work should be decided upon between [place] and her employer. -I provided with a letter today to excuse her from work today 02/13/2020 due to ongoing symptoms -Continue to maintain adequate hydration and rest -Do not feel that retesting will be helpful here as you can remain positive for quite some time despite symptom resolution  Continue current recommendations per Dr. Neldon Mc: 1.  Use nicotine replacements to eliminate tobacco smoke exposure.   2.  Eliminate use of over-the-counter nasal decongestant sprays 3.  Treat and prevent inflammation:   A.  Dymista-1 spray each nostril twice a day 4.  Treat and prevent reflux:   A. Consolidate caffeine consumption  B.  Pantoprazole 40 mg - 1 tablet in a.m.  C.  Famotidine 40 mg - 1 tablet in p.m. 5. Treat and prevent  headache:   A. Consolidate caffeine consumption  B.  Periactin 4 mg - 1/2-1 tablet at bedtime (clonazepam?) 6. If needed:   A. Nasal saline wash  B. Loratadine 10 mg - 1-2 tablet 1 time per day 7.  Have not done so thus far -- obtain Pneumovax and repeat Pneumo 23 antibody titer study in 6 weeks after immunization 8.  Return to clinic in 8 weeks or earlier if problem    Diagnostics: None.  Medication List:  Current Outpatient Medications  Medication Sig Dispense Refill  . acetaminophen (TYLENOL) 500 MG tablet Take 1,000 mg by mouth every 6 (six) hours as needed.    . cetirizine (ZYRTEC) 10 MG tablet Zyrtec 10 mg tablet  Take 1 tablet every day by oral route at bedtime.    . cholecalciferol (VITAMIN D3) 25 MCG (1000 UT) tablet Take by mouth daily. 2000 units daily    . clonazePAM (KLONOPIN) 0.5 MG tablet Take 0.5 mg by mouth 3 (three) times daily as needed for anxiety.    . diphenhydrAMINE (BENADRYL) 12.5 MG/5ML elixir Take by mouth 4 (four) times daily as needed.    Marland Kitchen EPINEPHrine 0.3 mg/0.3 mL IJ SOAJ injection Inject into the muscle.    . Immune Globulin, Human,-klhw (XEMBIFY) 10 GM/50ML SOLN Inject 20 g into the skin every 14 (fourteen) days. 200 mL 11  . propranolol ER (INDERAL LA) 60 MG 24 hr capsule TAKE 1 CAPSULE BY MOUTH ONCE DAILY. 90 capsule 0  . rosuvastatin (CRESTOR) 20 MG tablet TAKE 1 TABLET BY MOUTH DAILY. 90 tablet 0  . sertraline (ZOLOFT) 100 MG tablet Take 200 mg by mouth daily.    . vitamin B-12 (CYANOCOBALAMIN) 1000 MCG tablet Take 1 tablet (1,000 mcg total) by mouth daily. (Patient taking differently: Take 2,000 mcg by mouth daily.) 90 tablet 3  . hydrochlorothiazide (MICROZIDE) 12.5 MG capsule Take 1 capsule (12.5 mg total) by mouth daily. 90 capsule 3   No current facility-administered medications for this visit.   Allergies: Allergies  Allergen Reactions  . Dilaudid [Hydromorphone Hcl] Anaphylaxis  . Morphine And Related Anaphylaxis  . Iodinated  Diagnostic Agents Hives    IVP dye.   . Codeine   . Penicillins Hives  . Percocet [Oxycodone-Acetaminophen] Nausea Only  . Prednisone     psychosis  . Prozac [Fluoxetine Hcl]    I reviewed her past medical history, social history, family history, and environmental history and no significant changes have been reported from previous visit on 04/19/19.  Review of Systems  Constitutional: Positive for fatigue and fever.  HENT: Positive for postnasal drip, rhinorrhea and voice change.   Eyes: Negative.   Respiratory: Negative.   Cardiovascular: Negative.   Gastrointestinal: Positive for nausea and vomiting.  Musculoskeletal: Negative.   Skin: Negative.   Neurological: Positive for headaches.   Objective: Physical Exam Not obtained as encounter was done via telephone.   Previous notes and tests were reviewed.  I discussed the assessment and treatment plan with the patient. The patient was provided an opportunity to ask questions and all  were answered. The patient agreed with the plan and demonstrated an understanding of the instructions.   The patient was advised to call back or seek an in-person evaluation if the symptoms worsen or if the condition fails to improve as anticipated.  I provided 15 minutes of non-face-to-face time during this encounter.  It was my pleasure to participate in Kashina Mecum care today. Please feel free to contact me with any questions or concerns.   Sincerely,  Karlene Southard Charmian Muff, MD

## 2020-02-14 ENCOUNTER — Telehealth: Payer: Self-pay | Admitting: Internal Medicine

## 2020-02-14 ENCOUNTER — Ambulatory Visit: Payer: Self-pay

## 2020-02-14 NOTE — Telephone Encounter (Signed)
She needs to call [facility] and discuss her clearance with them.   As of her televisit yesterday she was still very symptomatic.

## 2020-02-14 NOTE — Telephone Encounter (Signed)
If she has been "released from [place]" then she should be able to return back to work.  I provided a work excuse for yesterday only as she reported continued symptoms at her televisit yesterday.  Thus she should be able to go to work since she has been "released" from [employer].    If her manager needs clearance from her Covid illness for her return to work then they should get that from [place] directly.    Any further issues regarding her return to work from [employer] illness needs to be addressed through [facility] and/or her PCP.

## 2020-02-14 NOTE — Telephone Encounter (Signed)
Patient has COVID and needs FMLA forms completed.   Patient is seeing [place] [facility]. +COVID 02/02/20  Start of leave : 02/02/2020 Return to work 02/27/2020 Patient starts forms will be faxed from [employer].

## 2020-02-14 NOTE — Telephone Encounter (Signed)
Patient was informed.  Patient stated she wanted an appt with Dr.Kozlow.  Patient was transferred to the front to schedule an appt with Dr. Neldon Mc

## 2020-02-14 NOTE — Telephone Encounter (Signed)
LVM for patient to call back. ?

## 2020-02-14 NOTE — Telephone Encounter (Signed)
Called and spoke to patient and stated that [facility] released her to go back to work. Patient is requesting a new letter stating that she can return to work. Patient expressed that if she is not back at work tomorrow she can loose  her position. Patient states that she always has nasal congestion and her GI issues have been gone for the past 24 hours. Please advise.

## 2020-02-14 NOTE — Telephone Encounter (Signed)
Patient called requesting letter to go back to work today.  She has not had diarrhea since Sunday.  Please advise.

## 2020-02-14 NOTE — Telephone Encounter (Signed)
Patient called about FMLA paper work being filled out. She can be reached at [phone].

## 2020-02-15 NOTE — Telephone Encounter (Signed)
Forms have been received via fax.   Forms have been completed and placed in providers box to review and sign.

## 2020-02-17 NOTE — Telephone Encounter (Signed)
Ok for a note to the patient for the dates she needs off

## 2020-02-17 NOTE — Telephone Encounter (Signed)
Patient called and said that health at work did another Covid 19 test on her today 02/17/20 and it was negative and she has been cleared to return to work on Monday 02/20/20.

## 2020-02-17 NOTE — Telephone Encounter (Signed)
Patient wondering if Dr. Jenny Reichmann could give her letter stating she is cleared to return to work on Monday so her employer will have some document until the FMLA goes through.

## 2020-02-20 DIAGNOSIS — Z0279 Encounter for issue of other medical certificate: Secondary | ICD-10-CM

## 2020-02-20 NOTE — Telephone Encounter (Signed)
Ok with me, thanks.

## 2020-02-20 NOTE — Telephone Encounter (Signed)
Forms have been signed, Faxed to [employer], Copy sent to [employer]Charged for.   Original mailed to patient for her records.

## 2020-02-28 ENCOUNTER — Other Ambulatory Visit: Payer: Self-pay | Admitting: Internal Medicine

## 2020-02-28 NOTE — Telephone Encounter (Signed)
Please refill as per office routine med refill policy (all routine meds refilled for 3 mo or monthly per pt preference up to one year from last visit, then month to month grace period for 3 mo, then further med refills will have to be denied)  

## 2020-02-29 ENCOUNTER — Other Ambulatory Visit: Payer: Self-pay | Admitting: Internal Medicine

## 2020-02-29 MED FILL — HYDROCHLOROTHIAZIDE 12.5 MG: 12.5 | 90 days supply | Qty: 90 | Fill #0

## 2020-03-19 ENCOUNTER — Other Ambulatory Visit: Payer: Self-pay | Admitting: Internal Medicine

## 2020-03-19 MED FILL — clonazePAM 0.5 MG TABS: 0.5 | 30 days supply | Qty: 90 | Fill #1

## 2020-03-19 MED FILL — SERTRALINE HCL 100 MG TABS: 100 | 90 days supply | Qty: 180 | Fill #1

## 2020-03-19 NOTE — Telephone Encounter (Signed)
Please refill as per office routine med refill policy (all routine meds refilled for 3 mo or monthly per pt preference up to one year from last visit, then month to month grace period for 3 mo, then further med refills will have to be denied)  

## 2020-03-21 ENCOUNTER — Other Ambulatory Visit: Payer: Self-pay | Admitting: Internal Medicine

## 2020-03-21 MED FILL — PROPRANOLOL HCL ER 60 MG CP: 60 | 90 days supply | Qty: 90 | Fill #0

## 2020-03-21 MED FILL — ROSUVASTATIN CALCIUM 20 MG: 20 | 90 days supply | Qty: 90 | Fill #0

## 2020-03-27 ENCOUNTER — Encounter: Payer: Self-pay | Admitting: Allergy and Immunology

## 2020-03-27 DIAGNOSIS — D801 Nonfamilial hypogammaglobulinemia: Secondary | ICD-10-CM

## 2020-03-27 NOTE — Addendum Note (Signed)
Addended by: Felipa Emory on: 03/27/2020 05:59 PM   Modules accepted: Orders

## 2020-03-27 NOTE — Telephone Encounter (Signed)
Pt would like lab slips to be faxed to [facility]. Fax number is [phone]. Mel Almond has faxed lab slips to [facility].

## 2020-03-29 LAB — IGG, IGA, IGM
IgA/Immunoglobulin A, Serum: 134 mg/dL (ref 87–352)
IgG (Immunoglobin G), Serum: 1112 mg/dL (ref 586–1602)
IgM (Immunoglobulin M), Srm: 95 mg/dL (ref 26–217)

## 2020-03-30 ENCOUNTER — Other Ambulatory Visit: Payer: Self-pay | Admitting: Internal Medicine

## 2020-03-30 ENCOUNTER — Encounter: Payer: Self-pay | Admitting: Internal Medicine

## 2020-03-30 DIAGNOSIS — J329 Chronic sinusitis, unspecified: Secondary | ICD-10-CM

## 2020-03-30 MED ORDER — SAXENDA 18 MG/3ML ~~LOC~~ SOPN: PEN_INJECTOR | SUBCUTANEOUS | 3 refills | Status: DC

## 2020-04-03 ENCOUNTER — Ambulatory Visit (INDEPENDENT_AMBULATORY_CARE_PROVIDER_SITE_OTHER): Payer: No Typology Code available for payment source | Admitting: Allergy and Immunology

## 2020-04-03 ENCOUNTER — Other Ambulatory Visit: Payer: Self-pay | Admitting: Allergy and Immunology

## 2020-04-03 ENCOUNTER — Encounter: Payer: Self-pay | Admitting: Allergy and Immunology

## 2020-04-03 ENCOUNTER — Other Ambulatory Visit: Payer: Self-pay

## 2020-04-03 ENCOUNTER — Telehealth: Payer: Self-pay

## 2020-04-03 VITALS — BP 118/80 | HR 75 | Temp 97.7°F | Resp 16 | Ht 60.0 in | Wt 165.4 lb

## 2020-04-03 DIAGNOSIS — D801 Nonfamilial hypogammaglobulinemia: Secondary | ICD-10-CM

## 2020-04-03 DIAGNOSIS — D839 Common variable immunodeficiency, unspecified: Secondary | ICD-10-CM

## 2020-04-03 MED ORDER — AZELASTINE-FLUTICASONE 137-50 MCG/ACT NA SUSP: NASAL | 5 refills | Status: DC

## 2020-04-03 NOTE — Patient Instructions (Addendum)
  1.  Continue nicotine replacements to eliminate tobacco smoke exposure.    2.  Minimize use of over-the-counter nasal decongestant sprays  3.  Treat and prevent inflammation:   A.  Dymista-1 spray each nostril twice a day  4.  Treat and prevent reflux:   A.  Follow-up with gastroenterologist for upper endoscopy  5. Treat and prevent headache:   A.  Consolidate caffeine consumption  B.  Consider evaluation with neurologist  5. If needed:   A. Nasal saline wash  B. Loratadine 10 mg - 1-2 tablet 1 time per day  6.  Continue Xembify infusions  7.  Return to clinic in 6 months or earlier if problem

## 2020-04-03 NOTE — Progress Notes (Signed)
[place] [facility] [place] [facility] [place]   Follow-up Note   Referring Provider: Biagio Borg, MD Primary Provider: Biagio Borg, MD Date of Office Visit: 04/03/2020  Subjective:   Pamela Waller (DOB: Feb 11, 1965) is a 55 y.o. female who returns to the [facility] [place] on 04/03/2020 in re-evaluation of the following:  HPI: Pamela Waller returns to this clinic in evaluation of hypogammaglobulinemia/CVID and a history of tobacco use and reflux and headaches.  I last saw her in this clinic on 19 April 2019.  We started her on Xembify infusions September 2021 and she actually thinks that she has done very well regarding her airway over the course of the past 6 months.  Unfortunately, she did contract COVID in January 2022 manifested as nasal symptoms and extreme fatigue and muscle aches and headaches.  She is starting to feel better regarding that issue.  She has received 2 [place] vaccines.  She has not had any other infections beyond her Covid infection.  She does have some nasal stuffiness on occasion for which she will occasionally use a nasal decongestant spray.  She is currently vaping instead of smoking tobacco.  She has had lots of problems with her GI tract and she is scheduled for an endoscopy sometime in the near future.  She did not find the use of a proton pump inhibitor or H2 receptor blocker to help her reflux very much.  She continues to have headaches and some sleep dysfunction.  She tried Periactin which did not help this issue at all.  Her headache frequency is about once every week at this point and she will take an Excedrin Migraine which works pretty well at this point.  Allergies as of 04/03/2020      Reactions   Dilaudid [hydromorphone Hcl] Anaphylaxis   Morphine And Related Anaphylaxis   Iodinated Diagnostic Agents Hives   IVP dye.    Codeine    Penicillins Hives   Percocet [oxycodone-acetaminophen] Nausea Only   Prednisone     psychosis   Prozac [fluoxetine Hcl]       Medication List      acetaminophen 500 MG tablet Commonly known as: TYLENOL Take 1,000 mg by mouth every 6 (six) hours as needed.   cetirizine 10 MG tablet Commonly known as: ZYRTEC Zyrtec 10 mg tablet  Take 1 tablet every day by oral route at bedtime.   cholecalciferol 25 MCG (1000 UNIT) tablet Commonly known as: VITAMIN D3 Take by mouth daily. 2000 units daily   clonazePAM 0.5 MG tablet Commonly known as: KLONOPIN Take 0.5 mg by mouth 3 (three) times daily as needed for anxiety.   diphenhydrAMINE 12.5 MG/5ML elixir Commonly known as: BENADRYL Take by mouth 4 (four) times daily as needed.   EPINEPHrine 0.3 mg/0.3 mL Soaj injection Commonly known as: EPI-PEN Inject into the muscle.   hydrochlorothiazide 12.5 MG capsule Commonly known as: MICROZIDE TAKE 1 CAPSULE BY MOUTH ONCE DAILY   propranolol ER 60 MG 24 hr capsule Commonly known as: INDERAL LA TAKE 1 CAPSULE BY MOUTH ONCE DAILY.   rosuvastatin 20 MG tablet Commonly known as: CRESTOR TAKE 1 TABLET BY MOUTH ONCE A DAY   Saxenda 18 MG/3ML Sopn Generic drug: Liraglutide -Weight Management INJECT 0.6MG  UNDER THE SKIN EVERY DAY FOR 1 WEEK, THEN INCREASE BY 0.6MG  WEEKLY UNTIL MAX OF 3MG  DAILY   sertraline 100 MG tablet Commonly known as: ZOLOFT Take 200 mg by mouth daily.   vitamin  B-12 1000 MCG tablet Commonly known as: CYANOCOBALAMIN Take 1 tablet (1,000 mcg total) by mouth daily. What changed: how much to take   Xembify 10 GM/50ML Soln Generic drug: Immune Globulin (Human)-klhw Inject 20 g into the skin every 14 (fourteen) days.       Past Medical History:  Diagnosis Date  . Chronic sinusitis 12/23/2018  . Endometriosis   . Hyperlipidemia   . Hypertension   . Hypogammaglobulinemia ([place])    sub class 1 and 3 deficiency  . Panic attacks   . Sinusitis   . Vitamin B12 deficiency 12/23/2018  . Vitamin D deficiency 12/23/2018    Past Surgical History:   Procedure Laterality Date  . ABDOMINAL HYSTERECTOMY    . BLADDER SURGERY    . BREAST LUMPECTOMY    . CESAREAN SECTION    . KNEE SURGERY    . NASAL SEPTUM SURGERY    . SINUSOTOMY      Review of systems negative except as noted in HPI / PMHx or noted below:  Review of Systems  Constitutional: Negative.   HENT: Negative.   Eyes: Negative.   Respiratory: Negative.   Cardiovascular: Negative.   Gastrointestinal: Negative.   Genitourinary: Negative.   Musculoskeletal: Negative.   Skin: Negative.   Neurological: Negative.   Endo/Heme/Allergies: Negative.   Psychiatric/Behavioral: Negative.      Objective:   Vitals:   04/03/20 1605  BP: 118/80  Pulse: 75  Resp: 16  Temp: 97.7 F (36.5 C)  SpO2: 92%   Height: 5' (152.4 cm)  Weight: 165 lb 6.4 oz (75 kg)   Physical Exam Constitutional:      Appearance: She is not diaphoretic.  HENT:     Head: Normocephalic.     Right Ear: Tympanic membrane, ear canal and external ear normal.     Left Ear: Tympanic membrane, ear canal and external ear normal.     Nose: Nose normal. No mucosal edema or rhinorrhea.     Mouth/Throat:     Pharynx: Uvula midline. No oropharyngeal exudate.  Eyes:     Conjunctiva/sclera: Conjunctivae normal.  Neck:     Thyroid: No thyromegaly.     Trachea: Trachea normal. No tracheal tenderness or tracheal deviation.  Cardiovascular:     Rate and Rhythm: Normal rate and regular rhythm.     Heart sounds: Normal heart sounds, S1 normal and S2 normal. No murmur heard.   Pulmonary:     Effort: No respiratory distress.     Breath sounds: Normal breath sounds. No stridor. No wheezing or rales.  Lymphadenopathy:     Head:     Right side of head: No tonsillar adenopathy.     Left side of head: No tonsillar adenopathy.     Cervical: No cervical adenopathy.  Skin:    Findings: No erythema or rash.     Nails: There is no clubbing.  Neurological:     Mental Status: She is alert.     Diagnostics:     Results of blood tests obtained 28 March 2020 identifies IgG 1112 mg/DL.  Assessment and Plan:   1. Hypogammaglobulinemia ([place])   2. CVID (common variable immunodeficiency) ([place])     1.  Continue nicotine replacements to eliminate tobacco smoke exposure.    2.  Minimize use of over-the-counter nasal decongestant sprays  3.  Treat and prevent inflammation:   A.  Dymista-1 spray each nostril twice a day  4.  Treat and prevent reflux:   A.  Follow-up  with gastroenterologist for upper endoscopy  5. Treat and prevent headache:   A.  Consolidate caffeine consumption  B.  Consider evaluation with neurologist  5. If needed:   A. Nasal saline wash  B. Loratadine 10 mg - 1-2 tablet 1 time per day  6.  Continue Xembify infusions  7.  Return to clinic in 6 months or earlier if problem  Latorya has really done well with her Xembify infusions and this is going to make a difference for her regarding development of recurrent infections.  There are bunch other things that are going on with her and she can follow-up with her gastroenterologist regarding her GI issue and she can consider evaluation with a neurologist regarding her headaches and she can use some Dymista for some upper airway issue should they develop.  I will see her back in his clinic in 6 months or earlier if there is a problem.  Allena Katz, MD Allergy / Immunology [place]

## 2020-04-03 NOTE — Telephone Encounter (Signed)
Prior auth for Saxenda----Key: [id number]

## 2020-04-04 ENCOUNTER — Encounter: Payer: Self-pay | Admitting: Allergy and Immunology

## 2020-04-09 ENCOUNTER — Telehealth: Payer: Self-pay

## 2020-04-12 ENCOUNTER — Other Ambulatory Visit (HOSPITAL_COMMUNITY): Payer: Self-pay

## 2020-04-13 MED FILL — AZELASTINE-FLUTICASONE 137-: 137-50 | 30 days supply | Qty: 23 | Fill #0

## 2020-04-16 NOTE — Telephone Encounter (Signed)
Cover my [employer] called and was wondering if a fax was received for an appeal for Saxenda. They were wondering if it would be appealed and if anymore information was needed. Please advise    Phone: [phone]  Reference Key: [id number]

## 2020-04-18 NOTE — Telephone Encounter (Signed)
Patient notified of the denial

## 2020-04-18 NOTE — Telephone Encounter (Signed)
No this was a medication specifically by her request  We are not responsible for her PA being turned down by the insurance

## 2020-04-21 ENCOUNTER — Other Ambulatory Visit (HOSPITAL_BASED_OUTPATIENT_CLINIC_OR_DEPARTMENT_OTHER): Payer: Self-pay

## 2020-04-21 MED FILL — Azelastine HCl-Fluticasone Prop Nasal Spray 137-50 MCG/ACT: NASAL | 30 days supply | Qty: 23 | Fill #0 | Status: CN

## 2020-04-24 ENCOUNTER — Other Ambulatory Visit (HOSPITAL_BASED_OUTPATIENT_CLINIC_OR_DEPARTMENT_OTHER): Payer: Self-pay

## 2020-04-24 MED FILL — Liraglutide (Weight Mngmt) Soln Pen-Inj 18 MG/3ML (6 MG/ML): SUBCUTANEOUS | 30 days supply | Qty: 12 | Fill #0 | Status: CN

## 2020-04-25 ENCOUNTER — Other Ambulatory Visit (HOSPITAL_BASED_OUTPATIENT_CLINIC_OR_DEPARTMENT_OTHER): Payer: Self-pay

## 2020-04-26 ENCOUNTER — Other Ambulatory Visit (HOSPITAL_BASED_OUTPATIENT_CLINIC_OR_DEPARTMENT_OTHER): Payer: Self-pay

## 2020-04-26 MED FILL — Clonazepam Tab 0.5 MG: ORAL | 30 days supply | Qty: 90 | Fill #0 | Status: AC

## 2020-04-30 ENCOUNTER — Other Ambulatory Visit (HOSPITAL_BASED_OUTPATIENT_CLINIC_OR_DEPARTMENT_OTHER): Payer: Self-pay

## 2020-04-30 MED FILL — Liraglutide (Weight Mngmt) Soln Pen-Inj 18 MG/3ML (6 MG/ML): SUBCUTANEOUS | 30 days supply | Qty: 15 | Fill #0 | Status: CN

## 2020-05-01 ENCOUNTER — Other Ambulatory Visit (HOSPITAL_BASED_OUTPATIENT_CLINIC_OR_DEPARTMENT_OTHER): Payer: Self-pay

## 2020-05-01 MED FILL — Liraglutide (Weight Mngmt) Soln Pen-Inj 18 MG/3ML (6 MG/ML): SUBCUTANEOUS | 30 days supply | Qty: 12 | Fill #0 | Status: CN

## 2020-05-13 ENCOUNTER — Encounter: Payer: Self-pay | Admitting: Internal Medicine

## 2020-05-16 ENCOUNTER — Other Ambulatory Visit: Payer: Self-pay

## 2020-05-16 ENCOUNTER — Ambulatory Visit: Payer: No Typology Code available for payment source | Admitting: Internal Medicine

## 2020-05-17 ENCOUNTER — Ambulatory Visit (INDEPENDENT_AMBULATORY_CARE_PROVIDER_SITE_OTHER): Payer: No Typology Code available for payment source | Admitting: Internal Medicine

## 2020-05-17 VITALS — BP 110/66 | HR 61 | Temp 98.4°F | Ht <= 58 in | Wt 157.0 lb

## 2020-05-17 DIAGNOSIS — Z634 Disappearance and death of family member: Secondary | ICD-10-CM

## 2020-05-17 DIAGNOSIS — N920 Excessive and frequent menstruation with regular cycle: Secondary | ICD-10-CM | POA: Insufficient documentation

## 2020-05-17 DIAGNOSIS — N6029 Fibroadenosis of unspecified breast: Secondary | ICD-10-CM | POA: Insufficient documentation

## 2020-05-17 DIAGNOSIS — Z48816 Encounter for surgical aftercare following surgery on the genitourinary system: Secondary | ICD-10-CM | POA: Insufficient documentation

## 2020-05-17 DIAGNOSIS — N393 Stress incontinence (female) (male): Secondary | ICD-10-CM

## 2020-05-17 DIAGNOSIS — F17200 Nicotine dependence, unspecified, uncomplicated: Secondary | ICD-10-CM | POA: Insufficient documentation

## 2020-05-17 DIAGNOSIS — N921 Excessive and frequent menstruation with irregular cycle: Secondary | ICD-10-CM | POA: Insufficient documentation

## 2020-05-17 DIAGNOSIS — D259 Leiomyoma of uterus, unspecified: Secondary | ICD-10-CM | POA: Insufficient documentation

## 2020-05-17 DIAGNOSIS — R928 Other abnormal and inconclusive findings on diagnostic imaging of breast: Secondary | ICD-10-CM | POA: Insufficient documentation

## 2020-05-17 HISTORY — DX: Disappearance and death of family member: Z63.4

## 2020-05-17 HISTORY — DX: Other abnormal and inconclusive findings on diagnostic imaging of breast: R92.8

## 2020-05-17 HISTORY — DX: Stress incontinence (female) (male): N39.3

## 2020-05-22 ENCOUNTER — Encounter: Payer: Self-pay | Admitting: Internal Medicine

## 2020-05-22 NOTE — Progress Notes (Signed)
Pt not seen.

## 2020-05-22 NOTE — Patient Instructions (Signed)
Pt not seen.

## 2020-06-05 ENCOUNTER — Other Ambulatory Visit (HOSPITAL_BASED_OUTPATIENT_CLINIC_OR_DEPARTMENT_OTHER): Payer: Self-pay

## 2020-06-05 ENCOUNTER — Other Ambulatory Visit: Payer: Self-pay | Admitting: Internal Medicine

## 2020-06-05 MED FILL — Clonazepam Tab 0.5 MG: ORAL | 30 days supply | Qty: 90 | Fill #1 | Status: AC

## 2020-06-05 MED FILL — Propranolol HCl Cap ER 24HR 60 MG: ORAL | 90 days supply | Qty: 90 | Fill #0 | Status: AC

## 2020-06-05 MED FILL — Hydrochlorothiazide Cap 12.5 MG: ORAL | 90 days supply | Qty: 90 | Fill #0 | Status: AC

## 2020-06-26 ENCOUNTER — Other Ambulatory Visit: Payer: Self-pay | Admitting: Internal Medicine

## 2020-06-26 ENCOUNTER — Other Ambulatory Visit (HOSPITAL_BASED_OUTPATIENT_CLINIC_OR_DEPARTMENT_OTHER): Payer: Self-pay

## 2020-06-26 NOTE — Telephone Encounter (Signed)
Please refill as per office routine med refill policy (all routine meds refilled for 3 mo or monthly per pt preference up to one year from last visit, then month to month grace period for 3 mo, then further med refills will have to be denied)  

## 2020-06-27 ENCOUNTER — Encounter: Payer: Self-pay | Admitting: Internal Medicine

## 2020-06-27 ENCOUNTER — Other Ambulatory Visit (HOSPITAL_BASED_OUTPATIENT_CLINIC_OR_DEPARTMENT_OTHER): Payer: Self-pay

## 2020-06-27 DIAGNOSIS — K219 Gastro-esophageal reflux disease without esophagitis: Secondary | ICD-10-CM

## 2020-06-27 MED ORDER — SERTRALINE HCL 100 MG PO TABS
2.0000 | ORAL_TABLET | Freq: Every day | ORAL | 1 refills | Status: DC
  Filled 2020-06-27: qty 180, 90d supply, fill #0

## 2020-06-27 MED ORDER — CLONAZEPAM 0.5 MG PO TABS
ORAL_TABLET | Freq: Three times a day (TID) | ORAL | 5 refills | Status: DC | PRN
  Filled 2020-06-27 – 2020-07-09 (×2): qty 90, 30d supply, fill #0

## 2020-06-27 MED ORDER — ROSUVASTATIN CALCIUM 20 MG PO TABS
ORAL_TABLET | Freq: Every day | ORAL | 0 refills | Status: DC
  Filled 2020-06-27: qty 90, 90d supply, fill #0

## 2020-06-29 ENCOUNTER — Other Ambulatory Visit (HOSPITAL_BASED_OUTPATIENT_CLINIC_OR_DEPARTMENT_OTHER): Payer: Self-pay

## 2020-07-09 ENCOUNTER — Other Ambulatory Visit (HOSPITAL_BASED_OUTPATIENT_CLINIC_OR_DEPARTMENT_OTHER): Payer: Self-pay

## 2020-07-12 ENCOUNTER — Other Ambulatory Visit (HOSPITAL_BASED_OUTPATIENT_CLINIC_OR_DEPARTMENT_OTHER): Payer: Self-pay

## 2020-07-12 ENCOUNTER — Other Ambulatory Visit: Payer: Self-pay

## 2020-07-12 ENCOUNTER — Encounter: Payer: Self-pay | Admitting: Internal Medicine

## 2020-07-12 ENCOUNTER — Ambulatory Visit (INDEPENDENT_AMBULATORY_CARE_PROVIDER_SITE_OTHER): Payer: No Typology Code available for payment source | Admitting: Internal Medicine

## 2020-07-12 VITALS — BP 124/76 | HR 66 | Temp 98.9°F | Ht 60.0 in | Wt 155.8 lb

## 2020-07-12 DIAGNOSIS — E559 Vitamin D deficiency, unspecified: Secondary | ICD-10-CM | POA: Diagnosis not present

## 2020-07-12 DIAGNOSIS — F17200 Nicotine dependence, unspecified, uncomplicated: Secondary | ICD-10-CM

## 2020-07-12 DIAGNOSIS — E538 Deficiency of other specified B group vitamins: Secondary | ICD-10-CM | POA: Diagnosis not present

## 2020-07-12 DIAGNOSIS — E785 Hyperlipidemia, unspecified: Secondary | ICD-10-CM

## 2020-07-12 DIAGNOSIS — R413 Other amnesia: Secondary | ICD-10-CM | POA: Diagnosis not present

## 2020-07-12 DIAGNOSIS — Z0001 Encounter for general adult medical examination with abnormal findings: Secondary | ICD-10-CM | POA: Diagnosis not present

## 2020-07-12 DIAGNOSIS — F411 Generalized anxiety disorder: Secondary | ICD-10-CM

## 2020-07-12 DIAGNOSIS — I10 Essential (primary) hypertension: Secondary | ICD-10-CM

## 2020-07-12 LAB — LIPID PANEL
Cholesterol: 212 mg/dL — ABNORMAL HIGH (ref 0–200)
HDL: 41.2 mg/dL (ref >39.00)
LDL Cholesterol: 131 mg/dL — ABNORMAL HIGH (ref 0–99)
NonHDL: 170.57
Total CHOL/HDL Ratio: 5
Triglycerides: 196 mg/dL — ABNORMAL HIGH (ref 0.0–149.0)
VLDL: 39.2 mg/dL (ref 0.0–40.0)

## 2020-07-12 LAB — CBC WITH DIFFERENTIAL/PLATELET
Basophils Absolute: 0.1 10*3/uL (ref 0.0–0.1)
Basophils Relative: 0.7 % (ref 0.0–3.0)
Eosinophils Absolute: 0.3 10*3/uL (ref 0.0–0.7)
Eosinophils Relative: 3.9 % (ref 0.0–5.0)
HCT: 42.5 % (ref 36.0–46.0)
Hemoglobin: 14.7 g/dL (ref 12.0–15.0)
Lymphocytes Relative: 32 % (ref 12.0–46.0)
Lymphs Abs: 2.3 10*3/uL (ref 0.7–4.0)
MCHC: 34.6 g/dL (ref 30.0–36.0)
MCV: 88.3 fl (ref 78.0–100.0)
Monocytes Absolute: 0.4 10*3/uL (ref 0.1–1.0)
Monocytes Relative: 5.6 % (ref 3.0–12.0)
Neutro Abs: 4.2 10*3/uL (ref 1.4–7.7)
Neutrophils Relative %: 57.8 % (ref 43.0–77.0)
Platelets: 123 10*3/uL — ABNORMAL LOW (ref 150.0–400.0)
RBC: 4.81 Mil/uL (ref 3.87–5.11)
RDW: 13.1 % (ref 11.5–15.5)
WBC: 7.3 10*3/uL (ref 4.0–10.5)

## 2020-07-12 LAB — TSH: TSH: 3.32 u[IU]/mL (ref 0.35–4.50)

## 2020-07-12 LAB — URINALYSIS, ROUTINE W REFLEX MICROSCOPIC
Bilirubin Urine: NEGATIVE
Ketones, ur: NEGATIVE
Leukocytes,Ua: NEGATIVE
Nitrite: NEGATIVE
Specific Gravity, Urine: 1.01 (ref 1.000–1.030)
Total Protein, Urine: NEGATIVE
Urine Glucose: NEGATIVE
Urobilinogen, UA: 0.2 (ref 0.0–1.0)
WBC, UA: NONE SEEN (ref 0–?)
pH: 6 (ref 5.0–8.0)

## 2020-07-12 LAB — HEPATIC FUNCTION PANEL
ALT: 16 U/L (ref 0–35)
AST: 19 U/L (ref 0–37)
Albumin: 4.8 g/dL (ref 3.5–5.2)
Alkaline Phosphatase: 65 U/L (ref 39–117)
Bilirubin, Direct: 0 mg/dL (ref 0.0–0.3)
Total Bilirubin: 0.6 mg/dL (ref 0.2–1.2)
Total Protein: 7.7 g/dL (ref 6.0–8.3)

## 2020-07-12 LAB — BASIC METABOLIC PANEL
BUN: 14 mg/dL (ref 6–23)
CO2: 28 mEq/L (ref 19–32)
Calcium: 10.3 mg/dL (ref 8.4–10.5)
Chloride: 104 mEq/L (ref 96–112)
Creatinine, Ser: 0.75 mg/dL (ref 0.40–1.20)
GFR: 89.85 mL/min (ref >60.00)
Glucose, Bld: 83 mg/dL (ref 70–99)
Potassium: 3.9 mEq/L (ref 3.5–5.1)
Sodium: 140 mEq/L (ref 135–145)

## 2020-07-12 LAB — VITAMIN B12: Vitamin B-12: 176 pg/mL — ABNORMAL LOW (ref 211–911)

## 2020-07-12 LAB — VITAMIN D 25 HYDROXY (VIT D DEFICIENCY, FRACTURES): VITD: 49.57 ng/mL (ref 30.00–100.00)

## 2020-07-12 MED ORDER — VITAMIN B-12 1000 MCG PO TABS
1000.0000 ug | ORAL_TABLET | Freq: Every day | ORAL | 3 refills | Status: DC
  Filled 2020-07-12: qty 100, 100d supply, fill #0

## 2020-07-12 NOTE — Assessment & Plan Note (Signed)
Lab Results  Component Value Date   VITAMINB12 166 (L) 12/27/2019   Low to start oral replacement - b12 1000 mcg qd

## 2020-07-12 NOTE — Assessment & Plan Note (Signed)
Last vitamin D Lab Results  Component Value Date   VD25OH 32.79 12/27/2019   Low normal, to start oral replacement

## 2020-07-12 NOTE — Progress Notes (Signed)
Patient ID: Pamela Waller, female   DOB: [birth year], 55 y.o.   MRN: [id number]         Chief Complaint:: wellness exam and Referral  To neurology, low vit d, low b12, and fatigue       HPI:  Pamela Waller is a 55 y.o. female here for wellness exam; declines pneumovax and shingrix, o/w up to date with preventive referrals and immunizations                        Also has had worsening reflux recently, to have EGD soon July 2022 with Dr Bryan Lemma.  Not taking PPI on purpose prior to this.  Has seen psychiatry recnelty with ongoing anxiety and recurrent depression .  Mosre recently with worsening HAs severe more frquent, cognitive difficutly and hard to find words she should know, has been CMA for 30 yrs and fatigue overwhelming, very concerned with something physical, but also ST memory worsening, such as getting lost on the way home she has done for years, even with GPS on. Mind kind of goes blank, new for her, and starting to affect the job.    Has worsening doe and fatigue to her kind of extreme with leg shaking at the top of the stairs.  Has been made appt per psychiatry to Dr Jaynee Eagles [facility], but needs referral to cover the appt made.  Did have low B12 last visit here, but only took for a short time, needs to restart.  Hard to know when her cognitive issues started, maybe feb, and tends to make her anxiety much worse.  Mother with hx of dementia, and mothers father.  Not taking Vit D or B12.  Lost 10 lbs with better diet.  Pt denies chest pain, wheezing, orthopnea, PND, increased LE swelling, palpitations, dizziness or syncope.   Pt denies polydipsia, polyuria.   Pt denies fever, wt loss, night sweats, loss of appetite, or other constitutional symptoms  Wt Readings from Last 3 Encounters:  07/12/20 155 lb 12.8 oz (70.7 kg)  05/17/20 157 lb (71.2 kg)  04/03/20 165 lb 6.4 oz (75 kg)   BP Readings from Last 3 Encounters:  07/12/20 124/76  05/17/20 110/66  04/03/20 118/80    Immunization History  Administered Date(s) Administered   Hepatitis B 05/15/2008, 06/14/2008   Influenza, Seasonal, Injecte, Preservative Fre 10/21/2014, 11/01/2015, 10/30/2017   Influenza,inj,Quad PF,6+ Mos 12/31/2016   Influenza,trivalent, recombinat, inj, PF 10/08/2007, 10/16/2008, 10/25/2009, 12/02/2012   Influenza-Unspecified 10/24/2019   PFIZER(Purple Top)SARS-COV-2 Vaccination 03/11/2019, 04/01/2019   PPD Test 08/18/2007, 06/20/2008, 09/04/2009   Pneumococcal Polysaccharide-23 12/08/2007, 10/10/2011, 03/05/2017   Tdap 08/18/2007, 10/10/2011   There are no preventive care reminders to display for this patient.     Past Medical History:  Diagnosis Date   Chronic sinusitis 12/23/2018   Endometriosis    Hyperlipidemia    Hypertension    Hypogammaglobulinemia ([place])    sub class 1 and 3 deficiency   Panic attacks    Sinusitis    Vitamin B12 deficiency 12/23/2018   Vitamin D deficiency 12/23/2018   Past Surgical History:  Procedure Laterality Date   ABDOMINAL HYSTERECTOMY     BLADDER SURGERY     BREAST LUMPECTOMY     CESAREAN SECTION     KNEE SURGERY     NASAL SEPTUM SURGERY     SINUSOTOMY      reports that she has been smoking cigarettes. She has a 14.00 pack-year smoking history. She  has never used smokeless tobacco. She reports that she does not drink alcohol and does not use drugs. family history includes Arthritis in her mother; Asthma in her mother; Breast cancer in her mother and sister; Hyperlipidemia in her father; Hypertension in her father; Pulmonary fibrosis in her father. Allergies  Allergen Reactions   Dilaudid [Hydromorphone Hcl] Anaphylaxis   Morphine And Related Anaphylaxis   Iodinated Diagnostic Agents Hives    IVP dye.    Codeine Other (See Comments)   Penicillins Hives and Other (See Comments)   Percocet [Oxycodone-Acetaminophen] Nausea Only   Prednisone     psychosis   Prozac [Fluoxetine Hcl]    Current Outpatient Medications on File Prior  to Visit  Medication Sig Dispense Refill   acetaminophen (TYLENOL) 500 MG tablet Take 1,000 mg by mouth every 6 (six) hours as needed.     cetirizine (ZYRTEC) 10 MG tablet Zyrtec 10 mg tablet  Take 1 tablet every day by oral route at bedtime.     cholecalciferol (VITAMIN D3) 25 MCG (1000 UT) tablet Take 5,000 Units by mouth daily. 2000 units daily     clonazePAM (KLONOPIN) 0.5 MG tablet Take 0.5 mg by mouth 3 (three) times daily as needed for anxiety.     diphenhydrAMINE (BENADRYL) 12.5 MG/5ML elixir Take by mouth 4 (four) times daily as needed.     EPINEPHrine 0.3 mg/0.3 mL IJ SOAJ injection Inject into the muscle.     hydrochlorothiazide (MICROZIDE) 12.5 MG capsule TAKE 1 CAPSULE BY MOUTH ONCE DAILY 90 capsule 3   Immune Globulin, Human,-klhw (XEMBIFY) 10 GM/50ML SOLN Inject 20 g into the skin every 14 (fourteen) days. 200 mL 11   influenza vac split quadrivalent PF (FLUARIX) 0.5 ML injection TAKE AS DIRECTED .5 mL 0   propranolol ER (INDERAL LA) 60 MG 24 hr capsule TAKE 1 CAPSULE BY MOUTH ONCE DAILY 90 capsule 0   rosuvastatin (CRESTOR) 20 MG tablet TAKE 1 TABLET BY MOUTH ONCE A DAY 90 tablet 0   sertraline (ZOLOFT) 100 MG tablet Take 200 mg by mouth daily.     sertraline (ZOLOFT) 100 MG tablet TAKE 2 TABLETS BY MOUTH DAILY. 180 tablet 1   sertraline (ZOLOFT) 100 MG tablet Take 2 tablets by mouth daily 180 tablet 1   No current facility-administered medications on file prior to visit.        ROS:  All others reviewed and negative.  Objective        PE:  BP 124/76 (BP Location: Left Arm, Patient Position: Sitting, Cuff Size: Normal)   Pulse 66   Temp 98.9 F (37.2 C) (Oral)   Ht 5' (1.524 m)   Wt 155 lb 12.8 oz (70.7 kg)   SpO2 96%   BMI 30.43 kg/m                 Constitutional: Pt appears in NAD               HENT: Head: NCAT.                Right Ear: External ear normal.                 Left Ear: External ear normal.                Eyes: . Pupils are equal, round, and  reactive to light. Conjunctivae and EOM are normal               Nose: without d/c  or deformity               Neck: Neck supple. Gross normal ROM               Cardiovascular: Normal rate and regular rhythm.                 Pulmonary/Chest: Effort normal and breath sounds without rales or wheezing.                Abd:  Soft, NT, ND, + BS, no organomegaly               Neurological: Pt is alert. At baseline orientation, motor grossly intact               Skin: Skin is warm. No rashes, no other new lesions, LE edema - none               Psychiatric: Pt behavior is normal without agitation , 2+ nervous  Micro: none  Cardiac tracings I have personally interpreted today:  none  Pertinent Radiological findings (summarize): none   Lab Results  Component Value Date   WBC 7.3 07/12/2020   HGB 14.7 07/12/2020   HCT 42.5 07/12/2020   PLT 123.0 (L) 07/12/2020   GLUCOSE 83 07/12/2020   CHOL 212 (H) 07/12/2020   TRIG 196.0 (H) 07/12/2020   HDL 41.20 07/12/2020   LDLCALC 131 (H) 07/12/2020   ALT 16 07/12/2020   AST 19 07/12/2020   NA 140 07/12/2020   K 3.9 07/12/2020   CL 104 07/12/2020   CREATININE 0.75 07/12/2020   BUN 14 07/12/2020   CO2 28 07/12/2020   TSH 3.32 07/12/2020   HGBA1C 5.3 12/27/2019   Assessment/Plan:  Pamela Waller is a 55 y.o. White or Caucasian [1] female with  has a past medical history of Chronic sinusitis (12/23/2018), Endometriosis, Hyperlipidemia, Hypertension, Hypogammaglobulinemia ([place]), Panic attacks, Sinusitis, Vitamin B12 deficiency (12/23/2018), and Vitamin D deficiency (12/23/2018).  Vitamin B12 deficiency Lab Results  Component Value Date   VITAMINB12 166 (L) 12/27/2019   Low to start oral replacement - b12 1000 mcg qd  Vitamin D deficiency Last vitamin D Lab Results  Component Value Date   VD25OH 32.79 12/27/2019   Low normal, to start oral replacement   Encounter for well adult exam with abnormal findings Age and sex appropriate education  and counseling updated with regular exercise and diet Referrals for preventative services - none needed Immunizations addressed - declines pneumovax and shingrix Smoking counseling  - counseled to quit, pt not ready Evidence for depression or other mood disorder - see notes Most recent labs reviewed. I have personally reviewed and have noted: 1) the patient's medical and social history 2) The patient's current medications and supplements 3) The patient's height, weight, and BMI have been recorded in the chart   Benign essential hypertension BP Readings from Last 3 Encounters:  07/12/20 124/76  05/17/20 110/66  04/03/20 118/80   Stable, pt to continue medical treatment - inderal la   Generalized anxiety disorder Chronic mod to severe persistent, cont f/u with psychiatry, cont same tx  Hyperlipidemia LDL goal <100 Lab Results  Component Value Date   LDLCALC 131 (H) 07/12/2020   uncontrolled, pt to restart crestor   Tobacco use disorder counseled to quit, pt not ready  Memory changes New worsening subjective, for b12 replacement, tx anxiety, MRI brain, refer neurology  Followup: Return in about 6 months (around 01/11/2021).  Cathlean Cower, MD 07/14/2020  10:29 PM [facility]

## 2020-07-12 NOTE — Patient Instructions (Addendum)
Please take all new medication as prescribed- the B12 1000 mcg per day  Please also consider taking OTC Vitamin D3 2000 units per day as your last Vit D level was very low normal  You will be contacted regarding the referral for: MRI for the brain, and Dr Jaynee Eagles for July 2022  Please continue all other medications as before, and refills have been done if requested.  Please have the pharmacy call with any other refills you may need.  Please continue your efforts at being more active, low cholesterol diet, and weight control.  You are otherwise up to date with prevention measures today.  Please keep your appointments with your specialists as you may have planned  Please make an Appointment to return in 6 months, or sooner if needed

## 2020-07-13 ENCOUNTER — Other Ambulatory Visit (HOSPITAL_BASED_OUTPATIENT_CLINIC_OR_DEPARTMENT_OTHER): Payer: Self-pay

## 2020-07-13 ENCOUNTER — Encounter: Payer: Self-pay | Admitting: Internal Medicine

## 2020-07-14 ENCOUNTER — Encounter: Payer: Self-pay | Admitting: Internal Medicine

## 2020-07-14 DIAGNOSIS — R413 Other amnesia: Secondary | ICD-10-CM | POA: Insufficient documentation

## 2020-07-14 DIAGNOSIS — Z0001 Encounter for general adult medical examination with abnormal findings: Secondary | ICD-10-CM | POA: Insufficient documentation

## 2020-07-14 NOTE — Assessment & Plan Note (Signed)
counseled to quit, pt not ready

## 2020-07-14 NOTE — Assessment & Plan Note (Signed)
New worsening subjective, for b12 replacement, tx anxiety, MRI brain, refer neurology

## 2020-07-14 NOTE — Assessment & Plan Note (Signed)
BP Readings from Last 3 Encounters:  07/12/20 124/76  05/17/20 110/66  04/03/20 118/80   Stable, pt to continue medical treatment - inderal la

## 2020-07-14 NOTE — Assessment & Plan Note (Addendum)
Age and sex appropriate education and counseling updated with regular exercise and diet Referrals for preventative services - none needed Immunizations addressed - declines pneumovax and shingrix Smoking counseling  - counseled to quit, pt not ready Evidence for depression or other mood disorder - see notes Most recent labs reviewed. I have personally reviewed and have noted: 1) the patient's medical and social history 2) The patient's current medications and supplements 3) The patient's height, weight, and BMI have been recorded in the chart

## 2020-07-14 NOTE — Assessment & Plan Note (Signed)
Lab Results  Component Value Date   LDLCALC 131 (H) 07/12/2020   uncontrolled, pt to restart crestor

## 2020-07-14 NOTE — Assessment & Plan Note (Signed)
Chronic mod to severe persistent, cont f/u with psychiatry, cont same tx

## 2020-07-17 ENCOUNTER — Encounter: Payer: Self-pay | Admitting: Internal Medicine

## 2020-07-18 ENCOUNTER — Other Ambulatory Visit (HOSPITAL_COMMUNITY): Payer: Self-pay

## 2020-07-18 MED ORDER — SULFAMETHOXAZOLE-TRIMETHOPRIM 800-160 MG PO TABS
1.0000 | ORAL_TABLET | Freq: Two times a day (BID) | ORAL | 0 refills | Status: DC
Start: 2020-07-18 — End: 2021-08-19
  Filled 2020-07-18: qty 20, 10d supply, fill #0

## 2020-07-19 ENCOUNTER — Other Ambulatory Visit (HOSPITAL_BASED_OUTPATIENT_CLINIC_OR_DEPARTMENT_OTHER): Payer: Self-pay

## 2020-07-19 ENCOUNTER — Encounter: Payer: Self-pay | Admitting: Gastroenterology

## 2020-07-19 ENCOUNTER — Other Ambulatory Visit: Payer: Self-pay

## 2020-07-19 ENCOUNTER — Ambulatory Visit (INDEPENDENT_AMBULATORY_CARE_PROVIDER_SITE_OTHER): Payer: No Typology Code available for payment source | Admitting: Gastroenterology

## 2020-07-19 VITALS — BP 110/74 | HR 60 | Ht 60.0 in | Wt 155.0 lb

## 2020-07-19 DIAGNOSIS — K219 Gastro-esophageal reflux disease without esophagitis: Secondary | ICD-10-CM

## 2020-07-19 DIAGNOSIS — R053 Chronic cough: Secondary | ICD-10-CM

## 2020-07-19 DIAGNOSIS — Z8719 Personal history of other diseases of the digestive system: Secondary | ICD-10-CM | POA: Diagnosis not present

## 2020-07-19 DIAGNOSIS — D801 Nonfamilial hypogammaglobulinemia: Secondary | ICD-10-CM | POA: Diagnosis not present

## 2020-07-19 DIAGNOSIS — D839 Common variable immunodeficiency, unspecified: Secondary | ICD-10-CM

## 2020-07-19 DIAGNOSIS — E538 Deficiency of other specified B group vitamins: Secondary | ICD-10-CM

## 2020-07-19 NOTE — Progress Notes (Signed)
Chief Complaint: GERD, dysphagia, cough   Referring Provider:     Biagio Borg, MD   HPI:     Pamela Waller is a 55 y.o. female with a history of HTN, HLD, hypogammaglobulinemia, endometriosis, chronic sinusitis, B12 deficiency, vitamin D deficiency, chronic thrombocytopenia, hysterectomy, bladder surgery, tobacco use (vaping), referred to the Gastroenterology Clinic for evaluation of GERD, dysphagia, postprandial cough.  She has a longstanding history of reflux dating back to her 73s.  She did have anorexia and bulimia in her 58s, to include hospital admission to [facility] in [facility] with weight nadir of 87  lbs.  After that, developed chronic reflux, described as regurgitation, particularly with forward flexion, intermittent substernal chest pain, and chronic dry cough.  She has trialed multiple medications over the years, to include pantoprazole, omeprazole, Pepcid, Zantac, Paregoric, Donnatal, with minimal improvement in symptoms.  Can actually have improvement of substernal CP with FD guard.  Has had enamel erosion due to reflux.    Reflux exacerbated by spicy foods, EtOH, coffee, along with forward flexion.  She has intentionally lost 13 lbs with dietary modifications.  Additionally, she describes intermittent choking with water and rare solid food dysphagia.  Chews food thoroughly.  She has had multiple upper endoscopies over the years, to include several previous dilations of esophageal stricture.  Most recent EGD was 2018 at [facility].  No procedure report available for review.  Prior to that, EGD in 10/2011 with esophageal stenosis at GE junction which was dilated and gastritis (path negative for H. pylori).  She is a longstanding history of reflux, but worsening reflux symptoms lately despite chronic PPI use.   Last colonoscopy was 2018 and notable for single benign polyp per patient, with recommendation repeat in 10 years.  Has had duodenal ulcer in the  past, in the setting of NSAIDs. No NSAIDs now.   Does follow with Psychiatry for anxiety/depression, and more recently was referred to Neurology for evaluation of cognitive changes (difficulty finding words, getting lost on the way home), fatigue.  Does have a history of B12 deficiency (166 in 12/2019), with prior B12 injections.  MRI brain scheduled for 07/31/2020.  Follows with Dr. Neldon Mc in the Allergy/Immunology clinic for hypogammaglobulinemia/CVID.  Was started on Xembify infusions in 09/2019.  Did have [place] in 01/2020.  Previously vaccinated with [employer] vaccine.  Reviewed labs from 07/12/2020: B12 low at 176.  Restarted on B12 pills.  Normal vitamin D, CMP.  PLT 123 (stable thrombocytopenia) and otherwise normal CBC.  Has a history of endometriosis with multiple laparoscopies and intra-abdominal surgeries in the past.  No history of SBO requiring emergent surgery.   Past Medical History:  Diagnosis Date   Chronic sinusitis 12/23/2018   Endometriosis    Hyperlipidemia    Hypertension    Hypogammaglobulinemia ([place])    sub class 1 and 3 deficiency   Panic attacks    Sinusitis    Vitamin B12 deficiency 12/23/2018   Vitamin D deficiency 12/23/2018     Past Surgical History:  Procedure Laterality Date   ABDOMINAL HYSTERECTOMY     BLADDER SURGERY     BREAST LUMPECTOMY     CESAREAN SECTION     KNEE SURGERY     NASAL SEPTUM SURGERY     SINUSOTOMY     Family History  Problem Relation Age of Onset   Asthma Mother    Arthritis Mother  Breast cancer Mother    Pulmonary fibrosis Father    Hyperlipidemia Father    Hypertension Father    Breast cancer Sister    Stomach cancer Neg Hx    Colon cancer Neg Hx    Pancreatic cancer Neg Hx    Liver disease Neg Hx    Esophageal cancer Neg Hx    Social History   Tobacco Use   Smoking status: Every Day    Packs/day: 0.50    Years: 28.00    Pack years: 14.00    Types: Cigarettes   Smokeless tobacco: Never  Vaping Use   Vaping  Use: Every day  Substance Use Topics   Alcohol use: Yes    Comment: occ   Drug use: Never   Current Outpatient Medications  Medication Sig Dispense Refill   acetaminophen (TYLENOL) 500 MG tablet Take 1,000 mg by mouth every 6 (six) hours as needed.     cetirizine (ZYRTEC) 10 MG tablet Zyrtec 10 mg tablet  Take 1 tablet every day by oral route at bedtime.     cholecalciferol (VITAMIN D3) 25 MCG (1000 UT) tablet Take 5,000 Units by mouth daily. 2000 units daily     clonazePAM (KLONOPIN) 0.5 MG tablet Take 0.5 mg by mouth 3 (three) times daily as needed for anxiety.     diphenhydrAMINE (BENADRYL) 12.5 MG/5ML elixir Take by mouth 4 (four) times daily as needed.     EPINEPHrine 0.3 mg/0.3 mL IJ SOAJ injection Inject into the muscle.     hydrochlorothiazide (MICROZIDE) 12.5 MG capsule TAKE 1 CAPSULE BY MOUTH ONCE DAILY 90 capsule 3   Immune Globulin, Human,-klhw (XEMBIFY) 10 GM/50ML SOLN Inject 20 g into the skin every 14 (fourteen) days. 200 mL 11   influenza vac split quadrivalent PF (FLUARIX) 0.5 ML injection TAKE AS DIRECTED .5 mL 0   propranolol ER (INDERAL LA) 60 MG 24 hr capsule TAKE 1 CAPSULE BY MOUTH ONCE DAILY 90 capsule 0   rosuvastatin (CRESTOR) 20 MG tablet TAKE 1 TABLET BY MOUTH ONCE A DAY 90 tablet 0   sertraline (ZOLOFT) 100 MG tablet Take 200 mg by mouth daily.     sertraline (ZOLOFT) 100 MG tablet TAKE 2 TABLETS BY MOUTH DAILY. 180 tablet 1   sertraline (ZOLOFT) 100 MG tablet Take 2 tablets by mouth daily 180 tablet 1   sulfamethoxazole-trimethoprim (BACTRIM DS) 800-160 MG tablet Take 1 tablet by mouth 2 (two) times daily. 20 tablet 0   vitamin B-12 (CYANOCOBALAMIN) 1000 MCG tablet Take 1 tablet (1,000 mcg total) by mouth daily. 100 tablet 3   No current facility-administered medications for this visit.   Allergies  Allergen Reactions   Dilaudid [Hydromorphone Hcl] Anaphylaxis   Morphine And Related Anaphylaxis   Iodinated Diagnostic Agents Hives    IVP dye.    Codeine  Other (See Comments)   Penicillins Hives and Other (See Comments)   Percocet [Oxycodone-Acetaminophen] Nausea Only   Prednisone     psychosis   Prozac [Fluoxetine Hcl]      Review of Systems: All systems reviewed and negative except where noted in HPI.     Physical Exam:    Wt Readings from Last 3 Encounters:  07/19/20 155 lb (70.3 kg)  07/12/20 155 lb 12.8 oz (70.7 kg)  05/17/20 157 lb (71.2 kg)    BP 110/74   Pulse 60   Ht 5' (1.524 m)   Wt 155 lb (70.3 kg)   SpO2 96%   BMI 30.27 kg/m  Constitutional:  Pleasant, in no acute distress. Psychiatric: Normal mood and affect. Behavior is normal. EENT: Pupils normal.  Conjunctivae are normal. No scleral icterus. Neck supple. No cervical LAD. Cardiovascular: Normal rate, regular rhythm. No edema Pulmonary/chest: Effort normal and breath sounds normal. No wheezing, rales or rhonchi. Abdominal: Soft, nondistended, nontender. Bowel sounds active throughout. There are no masses palpable. No hepatomegaly. Neurological: Alert and oriented to person place and time. Skin: Skin is warm and dry. No rashes noted.   ASSESSMENT AND PLAN;   1) GERD 2) Dysphagia/History of esophageal stricture 3) Regurgitation  - EGD to evaluate for erosive esophagitis along with LES laxity and hiatal hernia - Esophageal dilation and biopsies as appropriate - Depending on EGD results, could also consider further evaluation with pH/impedance testing  4) History of hypogammaglobulinemia and CVID 5) History of B12 deficiency 6) History of vitamin D deficiency - Evaluate for duodenal pathology at time of EGD with duodenal biopsies  The indications, risks, and benefits of EGD were explained to the patient in detail. Risks include but are not limited to bleeding, perforation, adverse reaction to medications, and cardiopulmonary compromise. Sequelae include but are not limited to the possibility of surgery, hospitalization, and mortality. The patient  verbalized understanding and wished to proceed. All questions answered, referred to scheduler. Further recommendations pending results of the exam.    Lavena Bullion, DO, FACG  07/19/2020, 10:32 AM   Biagio Borg, MD

## 2020-07-19 NOTE — Patient Instructions (Signed)
If you are age 55 or older, your body mass index should be between 23-30. Your Body mass index is 30.27 kg/m. If this is out of the aforementioned range listed, please consider follow up with your Primary Care Provider.  If you are age 35 or younger, your body mass index should be between 19-25. Your Body mass index is 30.27 kg/m. If this is out of the aformentioned range listed, please consider follow up with your Primary Care Provider.   __________________________________________________________  The [facility] providers would like to encourage you to use [facility] to communicate with providers for non-urgent requests or questions.  Due to long hold times on the telephone, sending your provider a message by [facility] may be a faster and more efficient way to get a response.  Please allow 48 business hours for a response.  Please remember that this is for non-urgent requests.   You have been scheduled for an endoscopy. Please follow written instructions given to you at your visit today. If you use inhalers (even only as needed), please bring them with you on the day of your procedure.  It was a pleasure to see you today!  Vito Cirigliano, D.O.

## 2020-07-26 ENCOUNTER — Other Ambulatory Visit (HOSPITAL_COMMUNITY): Payer: Self-pay

## 2020-07-30 ENCOUNTER — Ambulatory Visit (AMBULATORY_SURGERY_CENTER): Payer: No Typology Code available for payment source | Admitting: Gastroenterology

## 2020-07-30 ENCOUNTER — Encounter: Payer: Self-pay | Admitting: Gastroenterology

## 2020-07-30 ENCOUNTER — Other Ambulatory Visit: Payer: Self-pay

## 2020-07-30 VITALS — BP 118/75 | HR 55 | Temp 98.0°F | Resp 10 | Ht 60.0 in | Wt 155.0 lb

## 2020-07-30 DIAGNOSIS — K219 Gastro-esophageal reflux disease without esophagitis: Secondary | ICD-10-CM

## 2020-07-30 DIAGNOSIS — R1319 Other dysphagia: Secondary | ICD-10-CM

## 2020-07-30 DIAGNOSIS — Z8719 Personal history of other diseases of the digestive system: Secondary | ICD-10-CM

## 2020-07-30 DIAGNOSIS — D801 Nonfamilial hypogammaglobulinemia: Secondary | ICD-10-CM

## 2020-07-30 DIAGNOSIS — R053 Chronic cough: Secondary | ICD-10-CM

## 2020-07-30 DIAGNOSIS — K2970 Gastritis, unspecified, without bleeding: Secondary | ICD-10-CM | POA: Diagnosis not present

## 2020-07-30 DIAGNOSIS — K2990 Gastroduodenitis, unspecified, without bleeding: Secondary | ICD-10-CM

## 2020-07-30 DIAGNOSIS — D839 Common variable immunodeficiency, unspecified: Secondary | ICD-10-CM

## 2020-07-30 DIAGNOSIS — K2980 Duodenitis without bleeding: Secondary | ICD-10-CM | POA: Diagnosis not present

## 2020-07-30 DIAGNOSIS — K319 Disease of stomach and duodenum, unspecified: Secondary | ICD-10-CM

## 2020-07-30 DIAGNOSIS — E538 Deficiency of other specified B group vitamins: Secondary | ICD-10-CM

## 2020-07-30 MED ORDER — SODIUM CHLORIDE 0.9 % IV SOLN: 500.0000 mL | INTRAVENOUS | Status: DC

## 2020-07-30 NOTE — Patient Instructions (Signed)
HANDOUTS PROVIDED ON: GASTRITIS  The biopsies taken today have been sent for pathology.  The results can take 1-3 weeks to receive.    You may resume your previous diet and medication schedule.  Thank you for allowing [place] to care for you today!!!   YOU HAD AN ENDOSCOPIC PROCEDURE TODAY AT [facility]:   Refer to the procedure report that was given to you for any specific questions about what was found during the examination.  If the procedure report does not answer your questions, please call your gastroenterologist to clarify.  If you requested that your care partner not be given the details of your procedure findings, then the procedure report has been included in a sealed envelope for you to review at your convenience later.  YOU SHOULD EXPECT: Some feelings of bloating in the abdomen. Passage of more gas than usual.  Walking can help get rid of the air that was put into your GI tract during the procedure and reduce the bloating.   Please Note:  You might notice some irritation and congestion in your nose or some drainage.  This is from the oxygen used during your procedure.  There is no need for concern and it should clear up in a day or so.  SYMPTOMS TO REPORT IMMEDIATELY:  Following upper endoscopy (EGD)  Vomiting of blood or coffee ground material  New chest pain or pain under the shoulder blades  Painful or persistently difficult swallowing  New shortness of breath  Fever of 100F or higher  Black, tarry-looking stools  For urgent or emergent issues, a gastroenterologist can be reached at any hour by calling [phone]. Do not use MyChart messaging for urgent concerns.    DIET:  We do recommend a small meal at first, but then you may proceed to your regular diet.  Drink plenty of fluids but you should avoid alcoholic beverages for 24 hours.  ACTIVITY:  You should plan to take it easy for the rest of today and you should NOT DRIVE or use heavy machinery until  tomorrow (because of the sedation medicines used during the test).    FOLLOW UP: Our staff will call the number listed on your records Wednesday morning between 7:15 am and 8:15 am to check on you and address any questions or concerns that you may have regarding the information given to you following your procedure. If we do not reach you, we will leave a message.  We will attempt to reach you two times.  During this call, we will ask if you have developed any symptoms of COVID 19. If you develop any symptoms (ie: fever, flu-like symptoms, shortness of breath, cough etc.) before then, please call [phone].  If you test positive for Covid 19 in the 2 weeks post procedure, please call and report this information to [place].    If any biopsies were taken you will be contacted by phone or by letter within the next 1-3 weeks.  Please call us at [phone] if you have not heard about the biopsies in 3 weeks.    SIGNATURES/CONFIDENTIALITY: You and/or your care partner have signed paperwork which will be entered into your electronic medical record.  These signatures attest to the fact that that the information above on your After Visit Summary has been reviewed and is understood.  Full responsibility of the confidentiality of this discharge information lies with you and/or your care-partner.

## 2020-07-30 NOTE — Progress Notes (Signed)
Called to room to assist during endoscopic procedure.  Patient ID and intended procedure confirmed with present staff. Received instructions for my participation in the procedure from the performing physician.  

## 2020-07-30 NOTE — Op Note (Signed)
[facility] Patient Name: Pamela Waller Procedure Date: 07/30/2020 9:18 AM MRN: [id number] Endoscopist: Gerrit Heck , MD Age: 56 Referring MD:  Date of Birth: 07-19-1965 Gender: Female Account #: [id number] Procedure:                Upper GI endoscopy Indications:              Dysphagia, Suspected esophageal reflux, Chest pain                            (non cardiac), Post prandial cough                           History of esophageal stricture s/p dilation in the                            past                           Hx of CVID, vitamin B12, vitamin D deficiency Medicines:                Monitored Anesthesia Care Procedure:                Pre-Anesthesia Assessment:                           - Prior to the procedure, a History and Physical                            was performed, and patient medications and                            allergies were reviewed. The patient's tolerance of                            previous anesthesia was also reviewed. The risks                            and benefits of the procedure and the sedation                            options and risks were discussed with the patient.                            All questions were answered, and informed consent                            was obtained. Prior Anticoagulants: The patient has                            taken no previous anticoagulant or antiplatelet                            agents. ASA Grade Assessment: II - A patient with  mild systemic disease. After reviewing the risks                            and benefits, the patient was deemed in                            satisfactory condition to undergo the procedure.                           After obtaining informed consent, the endoscope was                            passed under direct vision. Throughout the                            procedure, the patient's blood pressure, pulse, and                             oxygen saturations were monitored continuously. The                            Endoscope was introduced through the mouth, and                            advanced to the second part of duodenum. The upper                            GI endoscopy was accomplished without difficulty.                            The patient tolerated the procedure well. Scope In: Scope Out: Findings:                 The examined esophagus was normal. Due to history                            of dysphagia, the decision was made to perform                            empiric esophageal dilation. A guidewire was placed                            and the scope was withdrawn. Dilation was performed                            with a Savary dilator with no resistance at 18 mm.                            The dilation site was examined following endoscope                            reinsertion and showed no bleeding, mucosal tear or  perforation. Biopsies were then obtained from the                            proximal and distal esophagus with cold forceps for                            histology of suspected eosinophilic esophagitis.                            Estimated blood loss was minimal.                           The Z-line was regular and was found 34 cm from the                            incisors.                           The gastroesophageal flap valve was visualized                            endoscopically and classified as Hill Grade I                            (prominent fold, tight to endoscope).                           Diffuse mild inflammation characterized by                            congestion (edema) and erythema was found in the                            gastric fundus, in the gastric body and in the                            gastric antrum. Biopsies were taken with a cold                            forceps for Helicobacter pylori testing. Estimated                             blood loss was minimal.                           Localized mildly erythematous mucosa without active                            bleeding was found in the duodenal bulb. Biopsies                            were taken with a cold forceps for histology.  Estimated blood loss was minimal.                           Mucosal flattening was found in the first portion                            of the duodenum and in the second portion of the                            duodenum. Biopsies were taken with a cold forceps                            for histology. Estimated blood loss was minimal. Complications:            No immediate complications. Estimated Blood Loss:     Estimated blood loss was minimal. Impression:               - Normal esophagus. Dilated. Biopsied.                           - Z-line regular, 34 cm from the incisors.                           - Gastroesophageal flap valve classified as Hill                            Grade I (prominent fold, tight to endoscope).                           - Gastritis. Biopsied.                           - Erythematous duodenopathy. Biopsied.                           - Flattened mucosa was found in the duodenum.                            Biopsied. Recommendation:           - Patient has a contact number available for                            emergencies. The signs and symptoms of potential                            delayed complications were discussed with the                            patient. Return to normal activities tomorrow.                            Written discharge instructions were provided to the                            patient.                           -  Resume previous diet.                           - Continue present medications.                           - Await pathology results.                           - If reflux symptoms, chest pain, and/or dysphagia                             persists, will plan on Esophageal Manometry and                            pH/Impedance testing off all acid suppression                            therapy. Gerrit Heck, MD 07/30/2020 9:48:05 AM

## 2020-07-30 NOTE — Progress Notes (Signed)
Vs CW ° °

## 2020-07-30 NOTE — Progress Notes (Signed)
PT taken to PACU. Monitors in place. VSS. Report given to RN. 

## 2020-07-31 ENCOUNTER — Ambulatory Visit (HOSPITAL_COMMUNITY)
Admission: RE | Admit: 2020-07-31 | Discharge: 2020-07-31 | Disposition: A | Discharge status: Home / Self Care | Payer: No Typology Code available for payment source | Source: Ambulatory Visit | Attending: Internal Medicine | Admitting: Internal Medicine

## 2020-07-31 ENCOUNTER — Ambulatory Visit: Payer: No Typology Code available for payment source | Admitting: Gastroenterology

## 2020-07-31 DIAGNOSIS — R413 Other amnesia: Secondary | ICD-10-CM | POA: Insufficient documentation

## 2020-08-01 ENCOUNTER — Telehealth: Payer: Self-pay | Admitting: *Deleted

## 2020-08-01 ENCOUNTER — Telehealth: Payer: Self-pay

## 2020-08-01 NOTE — Telephone Encounter (Signed)
No answer for post procedure call back. Left VM. 

## 2020-08-01 NOTE — Telephone Encounter (Signed)
No answer, left message to call back later today, B.Rumi Kolodziej RN. 

## 2020-08-02 ENCOUNTER — Encounter: Payer: Self-pay | Admitting: Internal Medicine

## 2020-08-06 ENCOUNTER — Encounter: Payer: No Typology Code available for payment source | Admitting: Gastroenterology

## 2020-08-06 ENCOUNTER — Telehealth: Payer: Self-pay | Admitting: Gastroenterology

## 2020-08-06 DIAGNOSIS — E538 Deficiency of other specified B group vitamins: Secondary | ICD-10-CM

## 2020-08-06 DIAGNOSIS — K2970 Gastritis, unspecified, without bleeding: Secondary | ICD-10-CM

## 2020-08-06 NOTE — Telephone Encounter (Signed)
Discussed recent endoscopy and biopsy results today.  While there appeared to be some subtle villous atrophy on endoscopy, the small bowel biopsies were normal.  Discussed etiology of B12 deficiency (and separately vitamin D deficiency, although now normal while taking 5000 IU/day).  Discussed DDx for B12 deficiency to include pernicious anemia (although gastric biopsies without features of autoimmune gastritis), gastritis seen on EGD, chronic PPI, SIBO, etc., and plan for the following:  - Check antibody to intrinsic factor - Check folate - Check iron panel - If unrevealing, plan for either breath testing vs empiric trial of rifaximin  Separately, for uncontrolled reflux, endoscopy without erosive esophagitis or significant LES laxity.  Following completion of the above work-up, discussed consideration of esophageal manometry and pH/impedance testing (off PPI x7 days)

## 2020-08-06 NOTE — Telephone Encounter (Signed)
Patient made aware.

## 2020-08-06 NOTE — Addendum Note (Signed)
Addended by: Curlene Labrum E on: 08/06/2020 02:17 PM   Modules accepted: Orders

## 2020-08-07 ENCOUNTER — Ambulatory Visit (INDEPENDENT_AMBULATORY_CARE_PROVIDER_SITE_OTHER): Payer: No Typology Code available for payment source | Admitting: Neurology

## 2020-08-07 ENCOUNTER — Encounter: Payer: Self-pay | Admitting: Neurology

## 2020-08-07 VITALS — BP 101/72 | HR 52 | Ht 60.0 in | Wt 155.0 lb

## 2020-08-07 DIAGNOSIS — R400 Somnolence: Secondary | ICD-10-CM | POA: Diagnosis not present

## 2020-08-07 DIAGNOSIS — R258 Other abnormal involuntary movements: Secondary | ICD-10-CM

## 2020-08-07 DIAGNOSIS — E538 Deficiency of other specified B group vitamins: Secondary | ICD-10-CM

## 2020-08-07 DIAGNOSIS — M6281 Muscle weakness (generalized): Secondary | ICD-10-CM

## 2020-08-07 DIAGNOSIS — R5383 Other fatigue: Secondary | ICD-10-CM

## 2020-08-07 DIAGNOSIS — R292 Abnormal reflex: Secondary | ICD-10-CM

## 2020-08-07 DIAGNOSIS — H43393 Other vitreous opacities, bilateral: Secondary | ICD-10-CM

## 2020-08-07 DIAGNOSIS — R519 Headache, unspecified: Secondary | ICD-10-CM

## 2020-08-07 DIAGNOSIS — R200 Anesthesia of skin: Secondary | ICD-10-CM

## 2020-08-07 DIAGNOSIS — W19XXXA Unspecified fall, initial encounter: Secondary | ICD-10-CM

## 2020-08-07 DIAGNOSIS — R2689 Other abnormalities of gait and mobility: Secondary | ICD-10-CM

## 2020-08-07 DIAGNOSIS — R0683 Snoring: Secondary | ICD-10-CM

## 2020-08-07 DIAGNOSIS — R270 Ataxia, unspecified: Secondary | ICD-10-CM

## 2020-08-07 DIAGNOSIS — M6289 Other specified disorders of muscle: Secondary | ICD-10-CM

## 2020-08-07 DIAGNOSIS — R202 Paresthesia of skin: Secondary | ICD-10-CM

## 2020-08-07 MED ORDER — CYANOCOBALAMIN 1000 MCG/ML IJ SOLN
1000.0000 ug | Freq: Once | INTRAMUSCULAR | Status: AC
  Administered 2020-08-07: 1000 ug via INTRAMUSCULAR

## 2020-08-07 NOTE — Progress Notes (Signed)
Pt here for appt, B 12 injection ordered.   Under aseptic technique cyanocobalamin 1063mcg/1ml IM given   R deltoid.  Tolerated well.  Bandaid applied.

## 2020-08-07 NOTE — Progress Notes (Signed)
[facility]    Provider:  Dr Jaynee Eagles Requesting Provider: Noemi Chapel, NP Primary Care Provider:  Biagio Borg, MD  CC:  migraines  HPI:  Pamela Waller is a 55 y.o. female here as requested by Noemi Chapel, NP for migraines. PMHx chronic b12 deficiency,    She is referred by Noemi Chapel.  I reviewed Dr. Dorethea Clan' notes, she is having to severe migraines a month and headaches every day, diagnosed with major depression, recurrent, moderate and circadian sleep rhythm disorder due to shift work type, she is on Zoloft and Klonopin, also on propranolol 60 mg at bedtime which can also be used for migraines.  She feels migraines are caused by stress. She also has numbness/tingling in the hands and feet and brain fog. She has a loss for words. She cognitively feels like crap. Her muscles are so fatigued. She has floaters in her eyes, she has not been to an eye doctor.She has problems with speech, loss of work, she has to keep her GPS on to get home from work, she loses rack of thoughts and drives past her exit, could be stress as well. She snores heavily an has excessive fatigues, she wakes up with headaches in the morning "all the time". She is on FMLA until the 15th of August so she can get everything completed. She had an EMG in the past which was normal. She has tension headaches, bitemporal, pressure, no migrainous symptoms (nor pulsating, no light sensitivity, or nausea). Headaches can be severe. She is very irritable. She has mood difficulties.   Reviewed notes, labs and imaging from outside physicians, which showed:   MRI: normal, personally reviewed images and agree  TSH 07/12/2020 normal B12 176 Hgba1c 5.3 12/27/2019  Review of Systems: Patient complains of symptoms per HPI as well as the following symptoms: depression. Pertinent negatives and positives per HPI. All others negative.   Social History   Socioeconomic History   Marital status: Widowed    Spouse name:  Not on file   Number of children: Not on file   Years of education: Not on file   Highest education level: Not on file  Occupational History   Not on file  Tobacco Use   Smoking status: Every Day    Packs/day: 0.50    Years: 28.00    Pack years: 14.00    Types: Cigarettes   Smokeless tobacco: Never  Vaping Use   Vaping Use: Every day  Substance and Sexual Activity   Alcohol use: Yes    Comment: occ   Drug use: Never   Sexual activity: Not on file  Other Topics Concern   Not on file  Social History Narrative   Not on file   Social Determinants of Health   Financial Resource Strain: Not on file  Food Insecurity: Not on file  Transportation Needs: Not on file  Physical Activity: Not on file  Stress: Not on file  Social Connections: Not on file  Intimate Partner Violence: Not on file    Family History  Problem Relation Age of Onset   Asthma Mother    Arthritis Mother    Breast cancer Mother    Pulmonary fibrosis Father    Hyperlipidemia Father    Hypertension Father    Breast cancer Sister    Stomach cancer Neg Hx    Colon cancer Neg Hx    Pancreatic cancer Neg Hx    Liver disease Neg Hx    Esophageal cancer Neg  Hx     Past Medical History:  Diagnosis Date   Allergy    Chronic sinusitis 12/23/2018   Depression    Endometriosis    GERD (gastroesophageal reflux disease)    Hyperlipidemia    Hypertension    Hypogammaglobulinemia (HCC)    sub class 1 and 3 deficiency   Panic attacks    Sinusitis    Vitamin B12 deficiency 12/23/2018   Vitamin D deficiency 12/23/2018    Patient Active Problem List   Diagnosis Date Noted   Encounter for well adult exam with abnormal findings 07/14/2020   Memory changes 07/14/2020   Abnormal mammogram 05/17/2020   Aftercare following other surgery of genitourinary system 05/17/2020   Bereavement, uncomplicated 26/94/8546   Fibroadenosis of breast 05/17/2020   Menorrhagia 05/17/2020   Metrorrhagia 05/17/2020   Stress  incontinence 05/17/2020   Leiomyoma of uterus 05/17/2020   Tobacco use disorder 05/17/2020   Upper back pain on left side 12/27/2019   Hypersomnolence 12/25/2018   Encounter for routine gynecological examination 12/23/2018   Allergic rhinitis 12/23/2018   Chronic sinusitis 12/23/2018   Vitamin D deficiency 12/23/2018   Vitamin B12 deficiency 12/23/2018   Sialadenitis 02/09/2018   Anxiety 02/05/2018   Raised TSH level 10/09/2016   History of Lyme disease 10/06/2016   Thrombocytopenic disorder ([place]) 10/06/2016   Snoring 10/06/2016   Sinusitis 10/06/2016   Disorder of vision 09/23/2016   Neuropathy 09/23/2016   Barrett's esophagus 09/23/2016   Insomnia 09/23/2016   Generalized anxiety disorder 07/11/2016   Allergic rhinitis due to pollen 07/11/2016   Depressive disorder 07/11/2016   Gastritis 07/11/2016   Benign essential hypertension 06/26/2015   Gastroesophageal reflux disease without esophagitis 02/08/2015   Idiopathic peripheral neuropathy 02/08/2015   Recurrent major depressive disorder, in partial remission ([place]) 02/08/2015   Myalgia 01/27/2013   Nicotine dependence 12/02/2012   Edema 11/08/2012   BRCA2 gene mutation positive 03/11/2011   Fatigue 12/04/2009   Hyperlipidemia LDL goal <100 12/04/2009   Hypogammaglobulinemia ([place]) 12/04/2009    Past Surgical History:  Procedure Laterality Date   ABDOMINAL HYSTERECTOMY     BLADDER SURGERY     BREAST LUMPECTOMY     CESAREAN SECTION     KNEE SURGERY     NASAL SEPTUM SURGERY     SINUSOTOMY      Current Outpatient Medications  Medication Sig Dispense Refill   acetaminophen (TYLENOL) 500 MG tablet Take 1,000 mg by mouth every 6 (six) hours as needed.     cetirizine (ZYRTEC) 10 MG tablet Zyrtec 10 mg tablet  Take 1 tablet every day by oral route at bedtime.     cholecalciferol (VITAMIN D3) 25 MCG (1000 UT) tablet Take 5,000 Units by mouth daily. 2000 units daily     clonazePAM (KLONOPIN) 0.5 MG tablet Take 0.5 mg by  mouth 3 (three) times daily as needed for anxiety.     diphenhydrAMINE (BENADRYL) 12.5 MG/5ML elixir Take by mouth 4 (four) times daily as needed.     EPINEPHrine 0.3 mg/0.3 mL IJ SOAJ injection Inject into the muscle.     hydrochlorothiazide (MICROZIDE) 12.5 MG capsule TAKE 1 CAPSULE BY MOUTH ONCE DAILY 90 capsule 3   Immune Globulin, Human,-klhw (XEMBIFY) 10 GM/50ML SOLN Inject 20 g into the skin every 14 (fourteen) days. 200 mL 11   influenza vac split quadrivalent PF (FLUARIX) 0.5 ML injection TAKE AS DIRECTED .5 mL 0   propranolol ER (INDERAL LA) 60 MG 24 hr capsule TAKE 1 CAPSULE BY MOUTH  ONCE DAILY 90 capsule 0   rosuvastatin (CRESTOR) 20 MG tablet TAKE 1 TABLET BY MOUTH ONCE A DAY 90 tablet 0   sertraline (ZOLOFT) 100 MG tablet Take 200 mg by mouth daily.     sulfamethoxazole-trimethoprim (BACTRIM DS) 800-160 MG tablet Take 1 tablet by mouth 2 (two) times daily. 20 tablet 0   vitamin B-12 (CYANOCOBALAMIN) 1000 MCG tablet Take 1 tablet (1,000 mcg total) by mouth daily. 100 tablet 3   No current facility-administered medications for this visit.    Allergies as of 08/07/2020 - Review Complete 08/07/2020  Allergen Reaction Noted   Dilaudid [hydromorphone hcl] Anaphylaxis 09/09/2018   Morphine and related Anaphylaxis 09/09/2018   Iodinated diagnostic agents Hives 05/02/2011   Codeine Other (See Comments) 01/17/2003   Penicillins Hives and Other (See Comments) 01/17/2003   Percocet [oxycodone-acetaminophen] Nausea Only 12/16/2019   Prednisone  09/09/2018   Prozac [fluoxetine hcl]  09/09/2018    Vitals: BP 101/72   Pulse (!) 52   Ht 5' (1.524 m)   Wt 155 lb (70.3 kg)   BMI 30.27 kg/m  Last Weight:  Wt Readings from Last 1 Encounters:  08/07/20 155 lb (70.3 kg)   Last Height:   Ht Readings from Last 1 Encounters:  08/07/20 5' (1.524 m)     Physical exam: Exam: Gen: NAD, conversant, well nourised, obese, well groomed                     CV: RRR, no MRG. No Carotid  Bruits. No peripheral edema, warm, nontender Eyes: Conjunctivae clear without exudates or hemorrhage  Neuro: Detailed Neurologic Exam  Speech:    Speech is normal; fluent and spontaneous with normal comprehension.  Cognition:    The patient is oriented to person, place, and time;     recent and remote memory intact;     language fluent;     normal attention, concentration,     fund of knowledge Cranial Nerves:    The pupils are equal, round, and reactive to light. The fundi flat. Visual fields are full to finger confrontation. Extraocular movements are intact. Trigeminal sensation is intact and the muscles of mastication are normal. The face is symmetric. The palate elevates in the midline. Hearing intact. Voice is normal. Shoulder shrug is normal. The tongue has normal motion without fasciculations.   Coordination:    Normal finger to nose   Gait:    Heel-toe and tandem gait with imbalance  Motor Observation:    No asymmetry, no atrophy, and no involuntary movements noted. Tone:    Normal muscle tone.    Posture:    Posture is normal. normal erect    Strength: some slightly prox weakness may be due to pain/giveway otherwise strength is V/V in the upper and lower limbs and is symmetrical.     Sensation: intact to LT     Reflex Exam:  DTR's:    Deep tendon reflexes in the upper and lower extremities are brisk  bilaterally.   Toes:    The toes are downgoing bilaterally.   Clonus:    2 beats clonus    Assessment/Plan:  Patient with headaches, weakness, imbalance, general fatigue, vision changes  Chronic B12 deficiency, not absorbing oral, will start her in B12 injections per week for 4 weeks and then evry month and se may need life-long B12 injections. Also cognitive complaints, will replete B12 and follow clinically and hold off on any neurocognitive testing because this could be b12  deficiency and lots of stress, she has had deaths in the family in the last few years  including mother, husband, step father, sister.   Floaters in her eyes: need ophthalmology  Snoring, fatigue, morning headaches: Sleep evaluation   She has headaches, bitemporal, pressure, no migrainous symptoms (not pulsating, no light sensitivity, or nausea): Sounds more like tension type headache will complete workup to see if she has sleep apnea causing or if B12 is causing, after workup can consider treatment if necessary  If all above is unrevealing or does not improve symptoms, need emg/ncs  Orders Placed This Encounter  Procedures   MR CERVICAL SPINE W WO CONTRAST   Vitamin B1   B12 and Folate Panel   Vitamin B6   Intrinsic Factor Antibodies   Anti-parietal antibody   Sedimentation rate   ANA, IFA (with reflex)   Methylmalonic acid, serum   Ambulatory referral to Ophthalmology   Ambulatory referral to Sleep Studies   No orders of the defined types were placed in this encounter.   Cc: Noemi Chapel, NP,  Biagio Borg, MD  Sarina Ill, MD  [facility] [address] [place],  [address]  Phone [phone] Fax [phone]

## 2020-08-07 NOTE — Addendum Note (Signed)
Addended by: Brandon Melnick on: 08/07/2020 10:55 AM   Modules accepted: Orders

## 2020-08-07 NOTE — Patient Instructions (Addendum)
B12 deficiency can cause weakness, fatigue, easy bruising or bleeding,sore tongue, stomach upset, weight loss, and diarrhea or constipation, tingling or numbness to the fingers and toes, difficulty walking, mood changes, depression, memory loss, disorientation and, in severe cases, dementia. Recommend 1041mcg b12 shots weekly for 4 weeks and then once a month for 6 months  Blood work today  Floaters: ophthalmology  Snoring, fatigue, morning headaches: Sleep evaluation   MRI cervical spine  Follow up 3 months

## 2020-08-09 ENCOUNTER — Telehealth: Payer: Self-pay | Admitting: Neurology

## 2020-08-09 NOTE — Telephone Encounter (Signed)
Referral faxed to [facility] in [place]. Phone: [phone].

## 2020-08-10 ENCOUNTER — Telehealth: Payer: Self-pay | Admitting: Neurology

## 2020-08-10 NOTE — Telephone Encounter (Signed)
Crystal from [employer] called informing provider that they do not accept the pt's insurance. Please advise.

## 2020-08-13 ENCOUNTER — Telehealth: Payer: Self-pay | Admitting: Gastroenterology

## 2020-08-13 ENCOUNTER — Other Ambulatory Visit (HOSPITAL_BASED_OUTPATIENT_CLINIC_OR_DEPARTMENT_OTHER): Payer: Self-pay

## 2020-08-13 MED ORDER — RIFAXIMIN 550 MG PO TABS
550.0000 mg | ORAL_TABLET | Freq: Three times a day (TID) | ORAL | 1 refills | Status: DC
  Filled 2020-08-13 (×2): qty 42, 14d supply, fill #0

## 2020-08-13 NOTE — Telephone Encounter (Signed)
Referral sent to Dr. Zenia Resides office. Phone: [phone].

## 2020-08-13 NOTE — Telephone Encounter (Signed)
Lab results from Neurology Clinic reviewed.  Discussed results with relation to potential GI etiology and symptoms and plan for the following:  - Trial course of rifaximin 550 mg p.o. 3 times daily x14 days, RF 1 for diagnostic and therapeutic intent of SIBO - Start taking probiotic and continue at least 4 weeks beyond completion of rifaximin course - Not unreasonable to start folic acid 1 mg/day for low normal folate

## 2020-08-15 ENCOUNTER — Other Ambulatory Visit: Payer: Self-pay

## 2020-08-15 ENCOUNTER — Encounter: Payer: Self-pay | Admitting: *Deleted

## 2020-08-15 ENCOUNTER — Telehealth: Payer: Self-pay

## 2020-08-15 ENCOUNTER — Other Ambulatory Visit (HOSPITAL_BASED_OUTPATIENT_CLINIC_OR_DEPARTMENT_OTHER): Payer: Self-pay

## 2020-08-15 ENCOUNTER — Telehealth: Payer: Self-pay | Admitting: *Deleted

## 2020-08-15 MED ORDER — RIFAXIMIN 550 MG PO TABS: 550.0000 mg | ORAL_TABLET | Freq: Three times a day (TID) | ORAL | 0 refills | Status: AC

## 2020-08-15 NOTE — Telephone Encounter (Signed)
error 

## 2020-08-15 NOTE — Telephone Encounter (Signed)
Spoke with patient and advised of Dr Cathren Laine message regarding B12, continue oral supplement, no need to do injections due to appearance of proper absorption. Patient verbalized understanding. She requested that I let Dr Jaynee Eagles know that she is being tested for SIBO by her GI doctor (which will "snuff" out the B12) and she is being placed on a 10 day course of Rifaximin empirically. She stated she will hold off on taking any supplements for the 10 days.

## 2020-08-17 ENCOUNTER — Other Ambulatory Visit (HOSPITAL_BASED_OUTPATIENT_CLINIC_OR_DEPARTMENT_OTHER): Payer: Self-pay

## 2020-08-17 LAB — ANTINUCLEAR ANTIBODIES, IFA: ANA Titer 1: NEGATIVE

## 2020-08-17 LAB — VITAMIN B1: Thiamine: 156.7 nmol/L (ref 66.5–200.0)

## 2020-08-17 LAB — INTRINSIC FACTOR ANTIBODIES: Intrinsic Factor Abs, Serum: 1 AU/mL (ref 0.0–1.1)

## 2020-08-17 LAB — B12 AND FOLATE PANEL
Folate: 4.9 ng/mL (ref >3.0)
Vitamin B-12: 559 pg/mL (ref 232–1245)

## 2020-08-17 LAB — ANTI-PARIETAL ANTIBODY: Parietal Cell Ab: 8 Units (ref 0.0–20.0)

## 2020-08-17 LAB — SEDIMENTATION RATE: Sed Rate: 10 mm/hr (ref 0–40)

## 2020-08-17 LAB — METHYLMALONIC ACID, SERUM: Methylmalonic Acid: 105 nmol/L (ref 0–378)

## 2020-08-17 LAB — VITAMIN B6: Vitamin B6: 7.3 ug/L (ref 3.4–65.2)

## 2020-08-17 MED ORDER — LAMOTRIGINE 25 MG PO TABS
ORAL_TABLET | ORAL | 1 refills | Status: DC
  Filled 2020-08-17: qty 120, 30d supply, fill #0

## 2020-08-17 MED ORDER — CLONAZEPAM 0.5 MG PO TABS
ORAL_TABLET | ORAL | 1 refills | Status: DC
  Filled 2020-08-17 – 2020-09-13 (×2): qty 90, 30d supply, fill #0
  Filled 2020-10-24: qty 90, 30d supply, fill #1

## 2020-08-17 MED ORDER — SERTRALINE HCL 100 MG PO TABS
200.0000 mg | ORAL_TABLET | Freq: Every day | ORAL | 1 refills | Status: DC
  Filled 2020-08-17 – 2020-09-13 (×2): qty 180, 90d supply, fill #0
  Filled 2020-12-20: qty 180, 90d supply, fill #1

## 2020-08-17 MED ORDER — CLONAZEPAM 0.5 MG PO TABS
ORAL_TABLET | ORAL | 0 refills | Status: DC
  Filled 2020-08-17: qty 90, 30d supply, fill #0

## 2020-08-20 NOTE — Telephone Encounter (Signed)
She was approved. [id number] (08/15/20- 09/14/20)

## 2020-08-22 ENCOUNTER — Ambulatory Visit (HOSPITAL_COMMUNITY)
Admission: RE | Admit: 2020-08-22 | Discharge: 2020-08-22 | Disposition: A | Discharge status: Home / Self Care | Payer: No Typology Code available for payment source | Source: Ambulatory Visit | Attending: Neurology | Admitting: Neurology

## 2020-08-22 ENCOUNTER — Other Ambulatory Visit: Payer: Self-pay

## 2020-08-22 DIAGNOSIS — R258 Other abnormal involuntary movements: Secondary | ICD-10-CM | POA: Diagnosis not present

## 2020-08-22 DIAGNOSIS — R200 Anesthesia of skin: Secondary | ICD-10-CM | POA: Insufficient documentation

## 2020-08-22 DIAGNOSIS — M5124 Other intervertebral disc displacement, thoracic region: Secondary | ICD-10-CM | POA: Diagnosis not present

## 2020-08-22 DIAGNOSIS — M47812 Spondylosis without myelopathy or radiculopathy, cervical region: Secondary | ICD-10-CM | POA: Insufficient documentation

## 2020-08-22 DIAGNOSIS — M6281 Muscle weakness (generalized): Secondary | ICD-10-CM | POA: Diagnosis not present

## 2020-08-22 DIAGNOSIS — R270 Ataxia, unspecified: Secondary | ICD-10-CM

## 2020-08-22 DIAGNOSIS — W19XXXA Unspecified fall, initial encounter: Secondary | ICD-10-CM | POA: Diagnosis not present

## 2020-08-22 DIAGNOSIS — M4802 Spinal stenosis, cervical region: Secondary | ICD-10-CM | POA: Diagnosis not present

## 2020-08-22 DIAGNOSIS — R292 Abnormal reflex: Secondary | ICD-10-CM | POA: Insufficient documentation

## 2020-08-22 DIAGNOSIS — R202 Paresthesia of skin: Secondary | ICD-10-CM | POA: Insufficient documentation

## 2020-08-22 DIAGNOSIS — M6289 Other specified disorders of muscle: Secondary | ICD-10-CM | POA: Diagnosis not present

## 2020-08-22 DIAGNOSIS — R2689 Other abnormalities of gait and mobility: Secondary | ICD-10-CM | POA: Insufficient documentation

## 2020-08-22 MED ORDER — GADOBUTROL 1 MMOL/ML IV SOLN
7.0000 mL | Freq: Once | INTRAVENOUS | Status: AC | PRN
  Administered 2020-08-22: 7 mL via INTRAVENOUS

## 2020-08-22 MED ORDER — GADOBUTROL 1 MMOL/ML IV SOLN: 7.0000 mL | Freq: Once | INTRAVENOUS | Status: DC | PRN

## 2020-08-23 ENCOUNTER — Telehealth: Payer: Self-pay | Admitting: *Deleted

## 2020-08-23 NOTE — Telephone Encounter (Signed)
Pt returned phone call, would like a call back.  

## 2020-08-23 NOTE — Telephone Encounter (Addendum)
Called pt and LVM (ok per DPR) advising pt of MRI c-spine results as noted below by Dr Felecia Shelling. I have asked the patient to call us back to schedule a 3 month follow up with Dr Jaynee Eagles and also encouraged her to check for sooner sleep consult as I see she is on the wait list. Left office number for call back.   ----- Message ----- From: Britt Bottom, MD Sent: 08/22/2020   5:49 PM EDT To: Gna-Pod 1 Results  Please let her know that the MRI of the cervical spine showed a disc herniation to the left at T1-T2.  This could cause some pain in the upper chest or the armpit to the left but would not affect walking.

## 2020-08-23 NOTE — Telephone Encounter (Signed)
Spoke with patient and discussed results.  She verbalized understanding.  We scheduled a 73-monthfollow-up with Dr. ALavell Anchorsfor Monday, October 31 at 7:30 AM.  Patient needs an early morning appointment or late afternoon.  She will also call back next week to see if we have any cancellations for a sleep consult.  She is on the wait list.

## 2020-08-27 ENCOUNTER — Other Ambulatory Visit (HOSPITAL_BASED_OUTPATIENT_CLINIC_OR_DEPARTMENT_OTHER): Payer: Self-pay

## 2020-09-13 ENCOUNTER — Other Ambulatory Visit: Payer: Self-pay | Admitting: Internal Medicine

## 2020-09-13 ENCOUNTER — Other Ambulatory Visit (HOSPITAL_BASED_OUTPATIENT_CLINIC_OR_DEPARTMENT_OTHER): Payer: Self-pay

## 2020-09-13 MED FILL — Hydrochlorothiazide Cap 12.5 MG: ORAL | 90 days supply | Qty: 90 | Fill #1 | Status: AC

## 2020-09-13 NOTE — Telephone Encounter (Signed)
Please refill as per office routine med refill policy (all routine meds refilled for 3 mo or monthly per pt preference up to one year from last visit, then month to month grace period for 3 mo, then further med refills will have to be denied)  

## 2020-09-14 ENCOUNTER — Other Ambulatory Visit (HOSPITAL_BASED_OUTPATIENT_CLINIC_OR_DEPARTMENT_OTHER): Payer: Self-pay

## 2020-09-17 ENCOUNTER — Other Ambulatory Visit (HOSPITAL_BASED_OUTPATIENT_CLINIC_OR_DEPARTMENT_OTHER): Payer: Self-pay

## 2020-09-18 ENCOUNTER — Other Ambulatory Visit (HOSPITAL_BASED_OUTPATIENT_CLINIC_OR_DEPARTMENT_OTHER): Payer: Self-pay

## 2020-09-18 MED ORDER — PROPRANOLOL HCL ER 60 MG PO CP24
ORAL_CAPSULE | Freq: Every day | ORAL | 0 refills | Status: DC
  Filled 2020-09-18: qty 90, 90d supply, fill #0

## 2020-09-21 ENCOUNTER — Other Ambulatory Visit (HOSPITAL_BASED_OUTPATIENT_CLINIC_OR_DEPARTMENT_OTHER): Payer: Self-pay

## 2020-09-21 MED ORDER — SERTRALINE HCL 100 MG PO TABS: 200.0000 mg | ORAL_TABLET | Freq: Every day | ORAL | 2 refills | Status: DC

## 2020-09-21 MED ORDER — CLONAZEPAM 0.5 MG PO TABS
ORAL_TABLET | ORAL | 2 refills | Status: DC
  Filled 2021-01-04: qty 90, 30d supply, fill #0
  Filled 2021-02-05: qty 90, 30d supply, fill #1

## 2020-10-08 ENCOUNTER — Other Ambulatory Visit (HOSPITAL_BASED_OUTPATIENT_CLINIC_OR_DEPARTMENT_OTHER): Payer: Self-pay

## 2020-10-08 ENCOUNTER — Other Ambulatory Visit: Payer: Self-pay | Admitting: Internal Medicine

## 2020-10-08 NOTE — Telephone Encounter (Signed)
Please refill as per office routine med refill policy (all routine meds to be refilled for 3 mo or monthly (per pt preference) up to one year from last visit, then month to month grace period for 3 mo, then further med refills will have to be denied) ? ?

## 2020-10-09 ENCOUNTER — Other Ambulatory Visit (HOSPITAL_BASED_OUTPATIENT_CLINIC_OR_DEPARTMENT_OTHER): Payer: Self-pay

## 2020-10-09 MED ORDER — ROSUVASTATIN CALCIUM 20 MG PO TABS
ORAL_TABLET | Freq: Every day | ORAL | 1 refills | Status: DC
  Filled 2020-10-09: qty 90, 90d supply, fill #0
  Filled 2021-01-10: qty 90, 90d supply, fill #1

## 2020-10-24 ENCOUNTER — Other Ambulatory Visit (HOSPITAL_BASED_OUTPATIENT_CLINIC_OR_DEPARTMENT_OTHER): Payer: Self-pay

## 2020-10-30 ENCOUNTER — Telehealth: Payer: Self-pay | Admitting: Gastroenterology

## 2020-10-30 DIAGNOSIS — G8929 Other chronic pain: Secondary | ICD-10-CM

## 2020-10-30 NOTE — Telephone Encounter (Signed)
Recently increased frequency of upper abdominal pain, postprandial belching, and gas production despite continued healthy eating habits.  Avoids fried, heavy, greasy foods, but still ongoing symptoms.  Prior normal hepatic function panel.  Plan for HIDA scan +/- RUQ [place] and additional labs pending results.

## 2020-10-31 ENCOUNTER — Other Ambulatory Visit (HOSPITAL_BASED_OUTPATIENT_CLINIC_OR_DEPARTMENT_OTHER): Payer: Self-pay | Admitting: Internal Medicine

## 2020-10-31 DIAGNOSIS — Z1231 Encounter for screening mammogram for malignant neoplasm of breast: Secondary | ICD-10-CM

## 2020-11-13 ENCOUNTER — Other Ambulatory Visit (HOSPITAL_BASED_OUTPATIENT_CLINIC_OR_DEPARTMENT_OTHER): Payer: Self-pay

## 2020-11-13 MED ORDER — INFLUENZA VAC SPLIT QUAD 0.5 ML IM SUSY
PREFILLED_SYRINGE | INTRAMUSCULAR | 0 refills | Status: DC
  Filled 2020-11-13: qty 0.5, 1d supply, fill #0

## 2020-11-19 ENCOUNTER — Ambulatory Visit: Payer: Self-pay | Admitting: Neurology

## 2020-11-29 ENCOUNTER — Other Ambulatory Visit (HOSPITAL_BASED_OUTPATIENT_CLINIC_OR_DEPARTMENT_OTHER): Payer: Self-pay

## 2020-11-29 MED ORDER — CLONAZEPAM 0.5 MG PO TABS
ORAL_TABLET | ORAL | 5 refills | Status: AC
  Filled 2020-11-29: qty 90, 30d supply, fill #0
  Filled 2021-03-15: qty 90, 30d supply, fill #1
  Filled 2021-04-08 – 2021-04-12 (×2): qty 90, 30d supply, fill #2

## 2020-11-29 MED ORDER — SERTRALINE HCL 100 MG PO TABS
200.0000 mg | ORAL_TABLET | Freq: Every day | ORAL | 1 refills | Status: DC
  Filled 2021-03-15: qty 180, 90d supply, fill #0

## 2020-12-03 ENCOUNTER — Other Ambulatory Visit (HOSPITAL_BASED_OUTPATIENT_CLINIC_OR_DEPARTMENT_OTHER): Payer: Self-pay

## 2020-12-20 ENCOUNTER — Other Ambulatory Visit (HOSPITAL_BASED_OUTPATIENT_CLINIC_OR_DEPARTMENT_OTHER): Payer: Self-pay

## 2020-12-20 ENCOUNTER — Other Ambulatory Visit: Payer: Self-pay | Admitting: Internal Medicine

## 2020-12-20 MED ORDER — PROPRANOLOL HCL ER 60 MG PO CP24
ORAL_CAPSULE | Freq: Every day | ORAL | 2 refills | Status: DC
  Filled 2020-12-20: qty 90, 90d supply, fill #0
  Filled 2021-03-15: qty 90, 90d supply, fill #1

## 2020-12-20 MED FILL — Hydrochlorothiazide Cap 12.5 MG: ORAL | 90 days supply | Qty: 90 | Fill #2 | Status: AC

## 2020-12-20 NOTE — Telephone Encounter (Signed)
Please refill as per office routine med refill policy (all routine meds to be refilled for 3 mo or monthly (per pt preference) up to one year from last visit, then month to month grace period for 3 mo, then further med refills will have to be denied) ? ?

## 2020-12-31 ENCOUNTER — Ambulatory Visit (HOSPITAL_BASED_OUTPATIENT_CLINIC_OR_DEPARTMENT_OTHER)
Admission: RE | Admit: 2020-12-31 | Discharge: 2020-12-31 | Disposition: A | Discharge status: Home / Self Care | Payer: No Typology Code available for payment source | Source: Ambulatory Visit | Attending: Internal Medicine | Admitting: Internal Medicine

## 2020-12-31 ENCOUNTER — Other Ambulatory Visit: Payer: Self-pay

## 2020-12-31 DIAGNOSIS — Z1231 Encounter for screening mammogram for malignant neoplasm of breast: Secondary | ICD-10-CM | POA: Insufficient documentation

## 2021-01-04 ENCOUNTER — Other Ambulatory Visit (HOSPITAL_BASED_OUTPATIENT_CLINIC_OR_DEPARTMENT_OTHER): Payer: Self-pay

## 2021-01-10 ENCOUNTER — Other Ambulatory Visit (HOSPITAL_BASED_OUTPATIENT_CLINIC_OR_DEPARTMENT_OTHER): Payer: Self-pay

## 2021-01-24 ENCOUNTER — Ambulatory Visit
Admission: EM | Admit: 2021-01-24 | Discharge: 2021-01-24 | Disposition: A | Discharge status: Home / Self Care | Payer: No Typology Code available for payment source | Attending: Family Medicine | Admitting: Family Medicine

## 2021-01-24 ENCOUNTER — Other Ambulatory Visit: Payer: Self-pay

## 2021-01-24 ENCOUNTER — Encounter: Payer: Self-pay | Admitting: Emergency Medicine

## 2021-01-24 DIAGNOSIS — Z1152 Encounter for screening for COVID-19: Secondary | ICD-10-CM

## 2021-01-24 DIAGNOSIS — H66001 Acute suppurative otitis media without spontaneous rupture of ear drum, right ear: Secondary | ICD-10-CM

## 2021-01-24 DIAGNOSIS — J069 Acute upper respiratory infection, unspecified: Secondary | ICD-10-CM | POA: Diagnosis not present

## 2021-01-24 MED ORDER — AZITHROMYCIN 250 MG PO TABS: ORAL_TABLET | ORAL | 0 refills | Status: DC

## 2021-01-24 MED ORDER — FLUTICASONE PROPIONATE 50 MCG/ACT NA SUSP: 1.0000 | Freq: Two times a day (BID) | NASAL | 2 refills | Status: DC

## 2021-01-24 NOTE — ED Provider Notes (Signed)
[facility]    CSN: [id number] Arrival date & time: 01/24/21  4315      History   Chief Complaint Chief Complaint  Patient presents with   Cough   Otalgia    HPI Pamela Waller is a 56 y.o. female.   Patient presenting today with 4-day history of generalized body aches, fatigue, cough, congestion, sinus pressure and now started yesterday with severe sharp right stabbing ear pain, muffled hearing.  Denies known fever, chest pain, shortness of breath, abdominal pain, nausea vomiting or diarrhea.  Taking over-the-counter cold and congestion medications, cough drops with minimal relief.  History of immune deficiency on supplements through immunologist.  Also history of seasonal allergies on Zyrtec.  Took a home COVID test that was negative.   Past Medical History:  Diagnosis Date   Allergy    Chronic sinusitis 12/23/2018   Depression    Endometriosis    GERD (gastroesophageal reflux disease)    Hyperlipidemia    Hypertension    Hypogammaglobulinemia (HCC)    sub class 1 and 3 deficiency   Panic attacks    Sinusitis    Vitamin B12 deficiency 12/23/2018   Vitamin D deficiency 12/23/2018    Patient Active Problem List   Diagnosis Date Noted   Encounter for well adult exam with abnormal findings 07/14/2020   Memory changes 07/14/2020   Abnormal mammogram 05/17/2020   Aftercare following other surgery of genitourinary system 05/17/2020   Bereavement, uncomplicated 40/08/6759   Fibroadenosis of breast 05/17/2020   Menorrhagia 05/17/2020   Metrorrhagia 05/17/2020   Stress incontinence 05/17/2020   Leiomyoma of uterus 05/17/2020   Tobacco use disorder 05/17/2020   Upper back pain on left side 12/27/2019   Hypersomnolence 12/25/2018   Encounter for routine gynecological examination 12/23/2018   Allergic rhinitis 12/23/2018   Chronic sinusitis 12/23/2018   Vitamin D deficiency 12/23/2018   Vitamin B12 deficiency 12/23/2018   Sialadenitis 02/09/2018    Anxiety 02/05/2018   Raised TSH level 10/09/2016   History of Lyme disease 10/06/2016   Thrombocytopenic disorder ([employer]) 10/06/2016   Snoring 10/06/2016   Sinusitis 10/06/2016   Disorder of vision 09/23/2016   Neuropathy 09/23/2016   Barrett's esophagus 09/23/2016   Insomnia 09/23/2016   Generalized anxiety disorder 07/11/2016   Allergic rhinitis due to pollen 07/11/2016   Depressive disorder 07/11/2016   Gastritis 07/11/2016   Benign essential hypertension 06/26/2015   Gastroesophageal reflux disease without esophagitis 02/08/2015   Idiopathic peripheral neuropathy 02/08/2015   Recurrent major depressive disorder, in partial remission ([place]) 02/08/2015   Myalgia 01/27/2013   Nicotine dependence 12/02/2012   Edema 11/08/2012   BRCA2 gene mutation positive 03/11/2011   Fatigue 12/04/2009   Hyperlipidemia LDL goal <100 12/04/2009   Hypogammaglobulinemia ([place]) 12/04/2009    Past Surgical History:  Procedure Laterality Date   ABDOMINAL HYSTERECTOMY     BLADDER SURGERY     BREAST LUMPECTOMY     CESAREAN SECTION     KNEE SURGERY     NASAL SEPTUM SURGERY     SINUSOTOMY      OB History   No obstetric history on file.      Home Medications    Prior to Admission medications   Medication Sig Start Date End Date Taking? Authorizing Provider  azithromycin (ZITHROMAX) 250 MG tablet Take first 2 tablets together, then 1 every day until finished. 01/24/21  Yes Volney American, PA-C  fluticasone [facility]) 50 MCG/ACT nasal spray Place 1 spray into both nostrils 2 (  two) times daily. 01/24/21  Yes Volney American, PA-C  acetaminophen (TYLENOL) 500 MG tablet Take 1,000 mg by mouth every 6 (six) hours as needed.    [provider]  cetirizine (ZYRTEC) 10 MG tablet Zyrtec 10 mg tablet  Take 1 tablet every day by oral route at bedtime.    [provider]  cholecalciferol (VITAMIN D3) 25 MCG (1000 UT) tablet Take 5,000 Units by mouth daily. 2000 units daily     [provider]  clonazePAM (KLONOPIN) 0.5 MG tablet Take 0.5 mg by mouth 3 (three) times daily as needed for anxiety.    [provider]  clonazePAM (KLONOPIN) 0.5 MG tablet Take 1 tablet by mouth 3 times daily as needed 08/16/20     clonazePAM (KLONOPIN) 0.5 MG tablet Take 1 tablet by mouth 3 times daily as needed 08/17/20     clonazePAM (KLONOPIN) 0.5 MG tablet Take 1 tablet by mouth 3 times daily as needed 09/21/20     clonazePAM (KLONOPIN) 0.5 MG tablet Take 1 tablet by mouth 3 times a day as needed 11/29/20     diphenhydrAMINE (BENADRYL) 12.5 MG/5ML elixir Take by mouth 4 (four) times daily as needed.    [provider]  EPINEPHrine 0.3 mg/0.3 mL IJ SOAJ injection Inject into the muscle. 10/03/19   [provider]  hydrochlorothiazide (MICROZIDE) 12.5 MG capsule TAKE 1 CAPSULE BY MOUTH ONCE DAILY 02/29/20 03/24/21  Biagio Borg, MD  Immune Globulin, Human,-klhw (XEMBIFY) 10 GM/50ML SOLN Inject 20 g into the skin every 14 (fourteen) days. 09/28/19   Kozlow, Donnamarie Poag, MD  influenza vac split quadrivalent PF (FLUARIX) 0.5 ML injection Inject into the muscle. 11/13/20   Carlyle Basques, MD  lamoTRIgine (LAMICTAL) 25 MG tablet Take 1 tablet by mouth at bedtime for 2 weeks then 2 tablets at bedtime for 2 weeks then 4 tablets at bedtime 08/17/20     propranolol ER (INDERAL LA) 60 MG 24 hr capsule TAKE 1 CAPSULE BY MOUTH ONCE DAILY 12/20/20 12/20/21  Biagio Borg, MD  rosuvastatin (CRESTOR) 20 MG tablet TAKE 1 TABLET BY MOUTH ONCE A DAY 10/09/20 10/09/21  Biagio Borg, MD  sertraline (ZOLOFT) 100 MG tablet Take 200 mg by mouth daily.    [provider]  sertraline (ZOLOFT) 100 MG tablet Take 2 tablets by mouth daily 08/17/20     sertraline (ZOLOFT) 100 MG tablet Take 2 tablets by mouth daily 09/21/20     sertraline (ZOLOFT) 100 MG tablet Take 2 tablets by mouth daily 11/29/20     sulfamethoxazole-trimethoprim (BACTRIM DS) 800-160 MG tablet Take 1 tablet by mouth 2 (two)  times daily. 07/18/20   Biagio Borg, MD  vitamin B-12 (CYANOCOBALAMIN) 1000 MCG tablet Take 1 tablet (1,000 mcg total) by mouth daily. 07/12/20   Biagio Borg, MD    Family History Family History  Problem Relation Age of Onset   Asthma Mother    Arthritis Mother    Breast cancer Mother    Pulmonary fibrosis Father    Hyperlipidemia Father    Hypertension Father    Breast cancer Sister    Stomach cancer Neg Hx    Colon cancer Neg Hx    Pancreatic cancer Neg Hx    Liver disease Neg Hx    Esophageal cancer Neg Hx     Social History Social History   Tobacco Use   Smoking status: Every Day    Packs/day: 0.50    Years: 28.00  Pack years: 14.00    Types: Cigarettes   Smokeless tobacco: Never  Vaping Use   Vaping Use: Every day  Substance Use Topics   Alcohol use: Yes    Comment: occ   Drug use: Never     Allergies   Dilaudid [hydromorphone hcl], Morphine and related, Iodinated contrast media, Codeine, Penicillins, Percocet [oxycodone-acetaminophen], Prednisone, and Prozac [fluoxetine hcl]   Review of Systems Review of Systems Per HPI  Physical Exam Triage Vital Signs ED Triage Vitals  Enc Vitals Group     BP 01/24/21 1158 106/73     Pulse Rate 01/24/21 1158 (!) 58     Resp 01/24/21 1158 16     Temp 01/24/21 1158 98.2 F (36.8 C)     Temp Source 01/24/21 1158 Oral     SpO2 01/24/21 1158 97 %     Weight --      Height --      Head Circumference --      Peak Flow --      Pain Score 01/24/21 1156 9     Pain Loc --      Pain Edu? --      Excl. in [place]? --    No data found.  Updated Vital Signs BP 106/73 (BP Location: Right Arm)    Pulse (!) 58    Temp 98.2 F (36.8 C) (Oral)    Resp 16    SpO2 97%   Visual Acuity Right Eye Distance:   Left Eye Distance:   Bilateral Distance:    Right Eye Near:   Left Eye Near:    Bilateral Near:     Physical Exam Vitals and nursing note reviewed.  Constitutional:      Appearance: Normal appearance.  HENT:      Head: Atraumatic.     Right Ear: External ear normal.     Left Ear: Tympanic membrane and external ear normal.     Ears:     Comments: Right TM bulging erythematous    Nose: Rhinorrhea present.     Mouth/Throat:     Mouth: Mucous membranes are moist.     Pharynx: Posterior oropharyngeal erythema present.  Eyes:     Extraocular Movements: Extraocular movements intact.     Conjunctiva/sclera: Conjunctivae normal.  Cardiovascular:     Rate and Rhythm: Normal rate and regular rhythm.     Heart sounds: Normal heart sounds.  Pulmonary:     Effort: Pulmonary effort is normal. No respiratory distress.     Breath sounds: Normal breath sounds. No wheezing or rales.  Musculoskeletal:        General: Normal range of motion.     Cervical back: Normal range of motion and neck supple.  Skin:    General: Skin is warm and dry.  Neurological:     Mental Status: She is alert and oriented to person, place, and time.  Psychiatric:        Mood and Affect: Mood normal.        Thought Content: Thought content normal.     UC Treatments / Results  Labs (all labs ordered are listed, but only abnormal results are displayed) Labs Reviewed  COVID-19, FLU A+B NAA    EKG   Radiology No results found.  Procedures Procedures (including critical care time)  Medications Ordered in UC Medications - No data to display  Initial Impression / Assessment and Plan / UC Course  I have reviewed the triage vital signs and the  nursing notes.  Pertinent labs & imaging results that were available during my care of the patient were reviewed by me and considered in my medical decision making (see chart for details).     COVID and flu swab pending, declines antiviral therapy today so we will treat with azithromycin, Flonase, continued over-the-counter supportive medications and home care for right ear infection secondary to viral upper respiratory infection.  Supportive home care and return precautions  reviewed.  Final Clinical Impressions(s) / UC Diagnoses   Final diagnoses:  Viral URI with cough  Non-recurrent acute suppurative otitis media of right ear without spontaneous rupture of tympanic membrane   Discharge Instructions   None    ED Prescriptions     Medication Sig Dispense Auth. Provider   azithromycin (ZITHROMAX) 250 MG tablet Take first 2 tablets together, then 1 every day until finished. 6 tablet Volney American, PA-C   fluticasone [facility]) 50 MCG/ACT nasal spray Place 1 spray into both nostrils 2 (two) times daily. 16 g Volney American, [place]      PDMP not reviewed this encounter.   Volney American, [place] 01/24/21 1235

## 2021-01-24 NOTE — ED Triage Notes (Signed)
Pt c/o cough x 4 days and right ear pain started yesterday.

## 2021-01-25 LAB — COVID-19, FLU A+B NAA
Influenza A, NAA: NOT DETECTED
Influenza B, NAA: NOT DETECTED
SARS-CoV-2, NAA: NOT DETECTED

## 2021-01-28 ENCOUNTER — Other Ambulatory Visit: Payer: Self-pay

## 2021-01-28 ENCOUNTER — Ambulatory Visit
Admission: RE | Admit: 2021-01-28 | Discharge: 2021-01-28 | Disposition: A | Discharge status: Home / Self Care | Payer: No Typology Code available for payment source | Source: Ambulatory Visit | Attending: Family Medicine | Admitting: Family Medicine

## 2021-01-28 VITALS — BP 112/79 | HR 78 | Temp 98.5°F | Resp 16

## 2021-01-28 DIAGNOSIS — R42 Dizziness and giddiness: Secondary | ICD-10-CM | POA: Diagnosis not present

## 2021-01-28 DIAGNOSIS — H65193 Other acute nonsuppurative otitis media, bilateral: Secondary | ICD-10-CM | POA: Diagnosis not present

## 2021-01-28 DIAGNOSIS — R110 Nausea: Secondary | ICD-10-CM | POA: Diagnosis not present

## 2021-01-28 DIAGNOSIS — J019 Acute sinusitis, unspecified: Secondary | ICD-10-CM | POA: Diagnosis not present

## 2021-01-28 MED ORDER — MECLIZINE HCL 25 MG PO TABS: 25.0000 mg | ORAL_TABLET | Freq: Three times a day (TID) | ORAL | 0 refills | Status: DC | PRN

## 2021-01-28 MED ORDER — PROMETHAZINE HCL 12.5 MG PO TABS: 12.5000 mg | ORAL_TABLET | Freq: Three times a day (TID) | ORAL | 0 refills | Status: DC | PRN

## 2021-01-28 MED ORDER — DEXAMETHASONE SODIUM PHOSPHATE 10 MG/ML IJ SOLN
10.0000 mg | Freq: Once | INTRAMUSCULAR | Status: AC
  Administered 2021-01-28: 10 mg via INTRAMUSCULAR

## 2021-01-28 NOTE — ED Provider Notes (Signed)
[facility]    CSN: [id number] Arrival date & time: 01/28/21  1554      History   Chief Complaint Chief Complaint  Patient presents with   Ear Fullness    Both ears are stopped up    HPI Pamela Waller is a 56 y.o. female.   Patient presenting today following up on recent visit 5 days ago for ear infection, sinusitis.  Has been on Flonase, azithromycin and states the ear pressure, sinus pain and pressure, congestion has only worsened though her cough has improved.  She states her ear pressure is now causing vertigo with dizziness and nausea.  Denies recent fevers, chills, chest pain, shortness of breath, abdominal pain, vomiting, diarrhea.  Over-the-counter supportive medications have not been helping.  Of note, she has an immune deficiency and has to take supplemental infusions for this.   Past Medical History:  Diagnosis Date   Allergy    Chronic sinusitis 12/23/2018   Depression    Endometriosis    GERD (gastroesophageal reflux disease)    Hyperlipidemia    Hypertension    Hypogammaglobulinemia (HCC)    sub class 1 and 3 deficiency   Panic attacks    Sinusitis    Vitamin B12 deficiency 12/23/2018   Vitamin D deficiency 12/23/2018    Patient Active Problem List   Diagnosis Date Noted   Encounter for well adult exam with abnormal findings 07/14/2020   Memory changes 07/14/2020   Abnormal mammogram 05/17/2020   Aftercare following other surgery of genitourinary system 05/17/2020   Bereavement, uncomplicated 66/59/9357   Fibroadenosis of breast 05/17/2020   Menorrhagia 05/17/2020   Metrorrhagia 05/17/2020   Stress incontinence 05/17/2020   Leiomyoma of uterus 05/17/2020   Tobacco use disorder 05/17/2020   Upper back pain on left side 12/27/2019   Hypersomnolence 12/25/2018   Encounter for routine gynecological examination 12/23/2018   Allergic rhinitis 12/23/2018   Chronic sinusitis 12/23/2018   Vitamin D deficiency 12/23/2018   Vitamin B12  deficiency 12/23/2018   Sialadenitis 02/09/2018   Anxiety 02/05/2018   Raised TSH level 10/09/2016   History of Lyme disease 10/06/2016   Thrombocytopenic disorder ([place]) 10/06/2016   Snoring 10/06/2016   Sinusitis 10/06/2016   Disorder of vision 09/23/2016   Neuropathy 09/23/2016   Barrett's esophagus 09/23/2016   Insomnia 09/23/2016   Generalized anxiety disorder 07/11/2016   Allergic rhinitis due to pollen 07/11/2016   Depressive disorder 07/11/2016   Gastritis 07/11/2016   Benign essential hypertension 06/26/2015   Gastroesophageal reflux disease without esophagitis 02/08/2015   Idiopathic peripheral neuropathy 02/08/2015   Recurrent major depressive disorder, in partial remission ([place]) 02/08/2015   Myalgia 01/27/2013   Nicotine dependence 12/02/2012   Edema 11/08/2012   BRCA2 gene mutation positive 03/11/2011   Fatigue 12/04/2009   Hyperlipidemia LDL goal <100 12/04/2009   Hypogammaglobulinemia ([place]) 12/04/2009   Past Surgical History:  Procedure Laterality Date   ABDOMINAL HYSTERECTOMY     BLADDER SURGERY     BREAST LUMPECTOMY     CESAREAN SECTION     KNEE SURGERY     NASAL SEPTUM SURGERY     SINUSOTOMY     OB History   No obstetric history on file.     Home Medications    Prior to Admission medications   Medication Sig Start Date End Date Taking? Authorizing Provider  meclizine (ANTIVERT) 25 MG tablet Take 1 tablet (25 mg total) by mouth 3 (three) times daily as needed for dizziness. 01/28/21  Yes  Volney American, PA-C  promethazine (PHENERGAN) 12.5 MG tablet Take 1 tablet (12.5 mg total) by mouth every 8 (eight) hours as needed for nausea or vomiting. May cause drowsiness 01/28/21  Yes Volney American, PA-C  acetaminophen (TYLENOL) 500 MG tablet Take 1,000 mg by mouth every 6 (six) hours as needed.    [provider]  azithromycin (ZITHROMAX) 250 MG tablet Take first 2 tablets together, then 1 every day until finished. 01/24/21   Volney American, PA-C  cetirizine (ZYRTEC) 10 MG tablet Zyrtec 10 mg tablet  Take 1 tablet every day by oral route at bedtime.    [provider]  cholecalciferol (VITAMIN D3) 25 MCG (1000 UT) tablet Take 5,000 Units by mouth daily. 2000 units daily    [provider]  clonazePAM (KLONOPIN) 0.5 MG tablet Take 0.5 mg by mouth 3 (three) times daily as needed for anxiety.    [provider]  clonazePAM (KLONOPIN) 0.5 MG tablet Take 1 tablet by mouth 3 times daily as needed 08/16/20     clonazePAM (KLONOPIN) 0.5 MG tablet Take 1 tablet by mouth 3 times daily as needed 08/17/20     clonazePAM (KLONOPIN) 0.5 MG tablet Take 1 tablet by mouth 3 times daily as needed 09/21/20     clonazePAM (KLONOPIN) 0.5 MG tablet Take 1 tablet by mouth 3 times a day as needed 11/29/20     diphenhydrAMINE (BENADRYL) 12.5 MG/5ML elixir Take by mouth 4 (four) times daily as needed.    [provider]  EPINEPHrine 0.3 mg/0.3 mL IJ SOAJ injection Inject into the muscle. 10/03/19   [provider]  fluticasone (FLONASE) 50 MCG/ACT nasal spray Place 1 spray into both nostrils 2 (two) times daily. 01/24/21   Volney American, PA-C  hydrochlorothiazide (MICROZIDE) 12.5 MG capsule TAKE 1 CAPSULE BY MOUTH ONCE DAILY 02/29/20 03/24/21  Biagio Borg, MD  Immune Globulin, Human,-klhw (XEMBIFY) 10 GM/50ML SOLN Inject 20 g into the skin every 14 (fourteen) days. 09/28/19   Kozlow, Donnamarie Poag, MD  influenza vac split quadrivalent PF (FLUARIX) 0.5 ML injection Inject into the muscle. 11/13/20   Carlyle Basques, MD  lamoTRIgine (LAMICTAL) 25 MG tablet Take 1 tablet by mouth at bedtime for 2 weeks then 2 tablets at bedtime for 2 weeks then 4 tablets at bedtime 08/17/20     propranolol ER (INDERAL LA) 60 MG 24 hr capsule TAKE 1 CAPSULE BY MOUTH ONCE DAILY 12/20/20 12/20/21  Biagio Borg, MD  rosuvastatin (CRESTOR) 20 MG tablet TAKE 1 TABLET BY MOUTH ONCE A DAY 10/09/20 10/09/21  Biagio Borg, MD  sertraline (ZOLOFT)  100 MG tablet Take 200 mg by mouth daily.    [provider]  sertraline (ZOLOFT) 100 MG tablet Take 2 tablets by mouth daily 08/17/20     sertraline (ZOLOFT) 100 MG tablet Take 2 tablets by mouth daily 09/21/20     sertraline (ZOLOFT) 100 MG tablet Take 2 tablets by mouth daily 11/29/20     sulfamethoxazole-trimethoprim (BACTRIM DS) 800-160 MG tablet Take 1 tablet by mouth 2 (two) times daily. 07/18/20   Biagio Borg, MD  vitamin B-12 (CYANOCOBALAMIN) 1000 MCG tablet Take 1 tablet (1,000 mcg total) by mouth daily. 07/12/20   Biagio Borg, MD    Family History Family History  Problem Relation Age of Onset   Asthma Mother    Arthritis Mother    Breast cancer Mother    Pulmonary fibrosis Father    Hyperlipidemia  Father    Hypertension Father    Breast cancer Sister    Stomach cancer Neg Hx    Colon cancer Neg Hx    Pancreatic cancer Neg Hx    Liver disease Neg Hx    Esophageal cancer Neg Hx    Social History Social History   Tobacco Use   Smoking status: Every Day    Packs/day: 0.50    Years: 28.00    Pack years: 14.00    Types: Cigarettes   Smokeless tobacco: Never  Vaping Use   Vaping Use: Every day  Substance Use Topics   Alcohol use: Yes    Comment: occ   Drug use: Never    Allergies   Dilaudid [hydromorphone hcl], Morphine and related, Iodinated contrast media, Codeine, Penicillins, Percocet [oxycodone-acetaminophen], Prednisone, and Prozac [fluoxetine hcl]   Review of Systems Review of Systems Per HPI  Physical Exam Triage Vital Signs ED Triage Vitals  Enc Vitals Group     BP 01/28/21 1641 112/79     Pulse Rate 01/28/21 1641 78     Resp 01/28/21 1641 16     Temp 01/28/21 1641 98.5 F (36.9 C)     Temp Source 01/28/21 1641 Oral     SpO2 01/28/21 1641 93 %     Weight --      Height --      Head Circumference --      Peak Flow --      Pain Score 01/28/21 1647 6     Pain Loc --      Pain Edu? --      Excl. in [place]? --    No data  found.  Updated Vital Signs BP 112/79 (BP Location: Right Arm)    Pulse 78    Temp 98.5 F (36.9 C) (Oral)    Resp 16    SpO2 93%   Visual Acuity Right Eye Distance:   Left Eye Distance:   Bilateral Distance:    Right Eye Near:   Left Eye Near:    Bilateral Near:     Physical Exam Vitals and nursing note reviewed.  Constitutional:      Appearance: Normal appearance.  HENT:     Head: Atraumatic.     Right Ear: External ear normal.     Left Ear: External ear normal.     Ears:     Comments: Significant bilateral middle ear effusions with TM injection    Nose: Congestion present.     Mouth/Throat:     Mouth: Mucous membranes are moist.     Pharynx: Posterior oropharyngeal erythema present.  Eyes:     Extraocular Movements: Extraocular movements intact.     Conjunctiva/sclera: Conjunctivae normal.  Cardiovascular:     Rate and Rhythm: Normal rate and regular rhythm.     Heart sounds: Normal heart sounds.  Pulmonary:     Effort: Pulmonary effort is normal.     Breath sounds: Normal breath sounds. No wheezing.  Abdominal:     General: Bowel sounds are normal. There is no distension.     Palpations: Abdomen is soft.     Tenderness: There is no abdominal tenderness. There is no guarding.  Musculoskeletal:        General: Normal range of motion.     Cervical back: Normal range of motion and neck supple.  Skin:    General: Skin is warm and dry.  Neurological:     General: No focal deficit present.  Mental Status: She is alert and oriented to person, place, and time.     Cranial Nerves: No cranial nerve deficit.     Motor: No weakness.     Gait: Gait normal.  Psychiatric:        Mood and Affect: Mood normal.        Thought Content: Thought content normal.     UC Treatments / Results  Labs (all labs ordered are listed, but only abnormal results are displayed) Labs Reviewed - No data to display  EKG   Radiology No results found.  Procedures Procedures  (including critical care time)  Medications Ordered in UC Medications  dexamethasone (DECADRON) injection 10 mg (10 mg Intramuscular Given 01/28/21 1700)    Initial Impression / Assessment and Plan / UC Course  I have reviewed the triage vital signs and the nursing notes.  Pertinent labs & imaging results that were available during my care of the patient were reviewed by me and considered in my medical decision making (see chart for details).     Intolerant to oral steroids but tolerates injectable steroids well.  IM Decadron given to help with ear effusion, sinus pressure and continue Flonase, try Coricidin HBP for further benefit.  We will also start meclizine, Phenergan for vertigo and nausea symptoms.  Supportive home care and return precautions reviewed.  Work note given.  Final Clinical Impressions(s) / UC Diagnoses   Final diagnoses:  Acute middle ear effusion, bilateral  Acute sinusitis, recurrence not specified, unspecified location  Vertigo  Nausea without vomiting   Discharge Instructions   None    ED Prescriptions     Medication Sig Dispense Auth. Provider   meclizine (ANTIVERT) 25 MG tablet Take 1 tablet (25 mg total) by mouth 3 (three) times daily as needed for dizziness. 15 tablet Volney American, [place]   promethazine (PHENERGAN) 12.5 MG tablet Take 1 tablet (12.5 mg total) by mouth every 8 (eight) hours as needed for nausea or vomiting. May cause drowsiness 15 tablet Volney American, [place]      PDMP not reviewed this encounter.   Volney American, [place] 01/28/21 1857

## 2021-01-28 NOTE — ED Triage Notes (Signed)
Patient states that her right ear is hurting bad.   Patient states that there is pressure still in both ears and the congestion in her head is unbearable.  Patient states she is very dizzy and nauseas from the vertigo and her ears feeling like they need to be popped.  Denies Meds  Denies fever

## 2021-02-04 ENCOUNTER — Encounter: Payer: Self-pay | Admitting: Allergy and Immunology

## 2021-02-04 DIAGNOSIS — D801 Nonfamilial hypogammaglobulinemia: Secondary | ICD-10-CM

## 2021-02-05 ENCOUNTER — Other Ambulatory Visit (HOSPITAL_BASED_OUTPATIENT_CLINIC_OR_DEPARTMENT_OTHER): Payer: Self-pay

## 2021-02-12 LAB — CBC WITH DIFFERENTIAL
Basophils Absolute: 0.1 10*3/uL (ref 0.0–0.2)
Basos: 1 %
EOS (ABSOLUTE): 0.3 10*3/uL (ref 0.0–0.4)
Eos: 3 %
Hematocrit: 41.7 % (ref 34.0–46.6)
Hemoglobin: 13.9 g/dL (ref 11.1–15.9)
Immature Grans (Abs): 0 10*3/uL (ref 0.0–0.1)
Immature Granulocytes: 1 %
Lymphocytes Absolute: 2.4 10*3/uL (ref 0.7–3.1)
Lymphs: 27 %
MCH: 30 pg (ref 26.6–33.0)
MCHC: 33.3 g/dL (ref 31.5–35.7)
MCV: 90 fL (ref 79–97)
Monocytes Absolute: 0.5 10*3/uL (ref 0.1–0.9)
Monocytes: 6 %
Neutrophils Absolute: 5.5 10*3/uL (ref 1.4–7.0)
Neutrophils: 62 %
RBC: 4.64 x10E6/uL (ref 3.77–5.28)
RDW: 12.6 % (ref 11.7–15.4)
WBC: 8.8 10*3/uL (ref 3.4–10.8)

## 2021-02-12 LAB — IGG, IGA, IGM
IgA/Immunoglobulin A, Serum: 125 mg/dL (ref 87–352)
IgG (Immunoglobin G), Serum: 810 mg/dL (ref 586–1602)
IgM (Immunoglobulin M), Srm: 87 mg/dL (ref 26–217)

## 2021-02-19 ENCOUNTER — Other Ambulatory Visit: Payer: Self-pay

## 2021-02-19 ENCOUNTER — Ambulatory Visit (INDEPENDENT_AMBULATORY_CARE_PROVIDER_SITE_OTHER): Payer: No Typology Code available for payment source | Admitting: Allergy and Immunology

## 2021-02-19 ENCOUNTER — Other Ambulatory Visit (HOSPITAL_BASED_OUTPATIENT_CLINIC_OR_DEPARTMENT_OTHER): Payer: Self-pay

## 2021-02-19 VITALS — BP 102/62 | HR 76 | Temp 97.9°F | Resp 16 | Ht 60.0 in | Wt 160.0 lb

## 2021-02-19 DIAGNOSIS — D839 Common variable immunodeficiency, unspecified: Secondary | ICD-10-CM

## 2021-02-19 DIAGNOSIS — D801 Nonfamilial hypogammaglobulinemia: Secondary | ICD-10-CM

## 2021-02-19 DIAGNOSIS — F17210 Nicotine dependence, cigarettes, uncomplicated: Secondary | ICD-10-CM

## 2021-02-19 DIAGNOSIS — J328 Other chronic sinusitis: Secondary | ICD-10-CM

## 2021-02-19 MED ORDER — NYSTATIN 100000 UNIT/ML MT SUSP
5.0000 mL | Freq: Two times a day (BID) | OROMUCOSAL | 0 refills | Status: DC | PRN
  Filled 2021-02-19: qty 60, 6d supply, fill #0

## 2021-02-19 MED ORDER — BUDESONIDE 0.5 MG/2ML IN SUSP
0.5000 mg | Freq: Every day | RESPIRATORY_TRACT | 5 refills | Status: DC
  Filled 2021-02-19: qty 60, 30d supply, fill #0

## 2021-02-19 MED ORDER — CEFDINIR 300 MG PO CAPS
300.0000 mg | ORAL_CAPSULE | Freq: Two times a day (BID) | ORAL | 0 refills | Status: AC
  Filled 2021-02-19: qty 20, 10d supply, fill #0

## 2021-02-19 MED ORDER — FLUCONAZOLE 150 MG PO TABS
150.0000 mg | ORAL_TABLET | Freq: Every day | ORAL | 0 refills | Status: DC
  Filled 2021-02-19: qty 3, 3d supply, fill #0

## 2021-02-19 NOTE — Patient Instructions (Addendum)
°  1.  Use nicotine replacements to eliminate tobacco smoke exposure.    2.  Treat infection:   A.  Omnicef 300 - 1 tablet 2 times per day for 10 days  3.  Treat and prevent inflammation:   A. 200 mls saline + budesonide 0.5 mg ampule 2 times per day  4. If needed:   A. Nasal saline wash  B. Loratadine 10 mg - 1-2 tablet 1 time per day  C. Nystatin - 5 mls swish and swallow 2 times a day if thrush develops  6.  Continue Xembify infusions  7.  Return to clinic in 6 months or earlier if problem

## 2021-02-19 NOTE — Progress Notes (Signed)
[place] [facility] [place] [facility] [place]   Follow-up Note  Referring Provider: Biagio Borg, MD Primary Provider: Biagio Borg, MD Date of Office Visit: 02/19/2021  Subjective:   Pamela Waller (DOB: [birth year]) is a 56 y.o. female who returns to the [facility] on 02/19/2021 in re-evaluation of the following:  HPI: Pamela Waller returns to this clinic in evaluation of hypogammaglobulinemia/CVID, history of tobacco use, history of reflux, and history of migraine headaches.  I last saw her in this clinic on 03 April 2020.  She states that she has had "sinusitis" constantly since her last visit.  Sinusitis for her means nasal congestion and some mucus production and may be some cough.  This will usually wax and wane and she can usually clear this issue on her own with some nasal saline.  She has been to the urgent care center twice in January 2023 and was diagnosed with otitis media and given azithromycin.  Currently she has nasal congestion and mucus and some cough and she has right ear pain and right throat pain.  She does not like to use nasal steroids because they give rise to "thrush".  She does not use penicillins because of her previous history of allergic reaction but she can take cephalosporins with no problem.  Other than these upper respiratory tract infection she has not had any other additional problems with infectious disease.  She continues on Xembify infusions.  She continues to smoke cigarettes at this point time with a rate of 4 to 5 cigarettes/day.  She was vaping but for some reason discontinued her vaping and started smoking cigarettes again.  When I last saw in this clinic she was having significant GI issues and she did have an upper endoscopy performed and what sounds like an esophageal dilation.  She is now using some type of peppermint oil which helps her abdominal discomfort.  Occasionally she will take her Prilosec less than 1 time per  month.  When I last saw her in this clinic she was having significant problems with headaches and she is now seeing a neurologist and apparently had an imaging study of her head which did not identify any significant abnormality.  She is now having her stress addressed by a therapist and she is doing better regarding this issue.  She only drinks 1 coffee per day.  She does not consume any soda or tea or chocolate.  Allergies as of 02/19/2021       Reactions   Dilaudid [hydromorphone Hcl] Anaphylaxis   Morphine And Related Anaphylaxis   Iodinated Contrast Media Hives   IVP dye.    Codeine Other (See Comments)   Penicillins Hives, Other (See Comments)   Percocet [oxycodone-acetaminophen] Nausea Only   Prednisone    psychosis   Prozac [fluoxetine Hcl]         Medication List    cetirizine 10 MG tablet Commonly known as: ZYRTEC Zyrtec 10 mg tablet  Take 1 tablet every day by oral route at bedtime.   cholecalciferol 25 MCG (1000 UNIT) tablet Commonly known as: VITAMIN D3 Take 5,000 Units by mouth daily. 2000 units daily   clonazePAM 0.5 MG tablet Commonly known as: KLONOPIN Take 0.5 mg by mouth 3 (three) times daily as needed for anxiety.   clonazePAM 0.5 MG tablet Commonly known as: KlonoPIN Take 1 tablet by mouth 3 times daily as needed   clonazePAM 0.5 MG tablet Commonly known as: KlonoPIN Take 1  tablet by mouth 3 times daily as needed   clonazePAM 0.5 MG tablet Commonly known as: KlonoPIN Take 1 tablet by mouth 3 times daily as needed   clonazePAM 0.5 MG tablet Commonly known as: KlonoPIN Take 1 tablet by mouth 3 times a day as needed   diphenhydrAMINE 12.5 MG/5ML elixir Commonly known as: BENADRYL Take by mouth 4 (four) times daily as needed.   EPINEPHrine 0.3 mg/0.3 mL Soaj injection Commonly known as: EPI-PEN Inject into the muscle.   Fluarix Quadrivalent 0.5 ML injection Generic drug: influenza vac split quadrivalent PF Inject into the muscle.    fluticasone 50 MCG/ACT nasal spray Commonly known as: FLONASE Place 1 spray into both nostrils 2 (two) times daily.   hydrochlorothiazide 12.5 MG capsule Commonly known as: MICROZIDE TAKE 1 CAPSULE BY MOUTH ONCE DAILY   meclizine 25 MG tablet Commonly known as: ANTIVERT Take 1 tablet (25 mg total) by mouth 3 (three) times daily as needed for dizziness.   promethazine 12.5 MG tablet Commonly known as: PHENERGAN Take 1 tablet (12.5 mg total) by mouth every 8 (eight) hours as needed for nausea or vomiting. May cause drowsiness   propranolol ER 60 MG 24 hr capsule Commonly known as: INDERAL LA TAKE 1 CAPSULE BY MOUTH ONCE DAILY   rosuvastatin 20 MG tablet Commonly known as: CRESTOR TAKE 1 TABLET BY MOUTH ONCE A DAY   sertraline 100 MG tablet Commonly known as: ZOLOFT Take 200 mg by mouth daily.   sertraline 100 MG tablet Commonly known as: Zoloft Take 2 tablets by mouth daily   sertraline 100 MG tablet Commonly known as: Zoloft Take 2 tablets by mouth daily   sertraline 100 MG tablet Commonly known as: Zoloft Take 2 tablets by mouth daily   sulfamethoxazole-trimethoprim 800-160 MG tablet Commonly known as: BACTRIM DS Take 1 tablet by mouth 2 (two) times daily.   vitamin B-12 1000 MCG tablet Commonly known as: CYANOCOBALAMIN Take 1 tablet (1,000 mcg total) by mouth daily.   Xembify 10 GM/50ML Soln Generic drug: Immune Globulin (Human)-klhw Inject 20 g into the skin every 14 (fourteen) days.    Past Medical History:  Diagnosis Date   Allergy    Chronic sinusitis 12/23/2018   Depression    Endometriosis    GERD (gastroesophageal reflux disease)    Hyperlipidemia    Hypertension    Hypogammaglobulinemia (HCC)    sub class 1 and 3 deficiency   Panic attacks    Sinusitis    Vitamin B12 deficiency 12/23/2018   Vitamin D deficiency 12/23/2018    Past Surgical History:  Procedure Laterality Date   ABDOMINAL HYSTERECTOMY     BLADDER SURGERY     BREAST  LUMPECTOMY     CESAREAN SECTION     KNEE SURGERY     NASAL SEPTUM SURGERY     SINUSOTOMY      Review of systems negative except as noted in HPI / PMHx or noted below:  Review of Systems  Constitutional: Negative.   HENT: Negative.    Eyes: Negative.   Respiratory: Negative.    Cardiovascular: Negative.   Gastrointestinal: Negative.   Genitourinary: Negative.   Musculoskeletal: Negative.   Skin: Negative.   Neurological: Negative.   Endo/Heme/Allergies: Negative.   Psychiatric/Behavioral: Negative.      Objective:   Vitals:   02/19/21 1536  BP: 102/62  Pulse: 76  Resp: 16  Temp: 97.9 F (36.6 C)  SpO2: 96%   Height: 5' (152.4 cm)  Weight: 160  lb (72.6 kg)    Physical Exam Constitutional:      Appearance: She is not diaphoretic.  HENT:     Head: Normocephalic.     Right Ear: Ear canal and external ear normal. A middle ear effusion is present.     Left Ear: Tympanic membrane, ear canal and external ear normal.     Nose: Nose normal. No mucosal edema or rhinorrhea.     Mouth/Throat:     Pharynx: Uvula midline. No oropharyngeal exudate.  Eyes:     Conjunctiva/sclera: Conjunctivae normal.  Neck:     Thyroid: No thyromegaly.     Trachea: Trachea normal. No tracheal tenderness or tracheal deviation.  Cardiovascular:     Rate and Rhythm: Normal rate and regular rhythm.     Heart sounds: Normal heart sounds, S1 normal and S2 normal. No murmur heard. Pulmonary:     Effort: No respiratory distress.     Breath sounds: Normal breath sounds. No stridor. No wheezing or rales.  Lymphadenopathy:     Head:     Right side of head: No tonsillar adenopathy.     Left side of head: No tonsillar adenopathy.     Cervical: No cervical adenopathy.  Skin:    Findings: No erythema or rash.     Nails: There is no clubbing.  Neurological:     Mental Status: She is alert.    Diagnostics:   Results of blood tests obtained 11 February 2021 identifies serum IgG 810 mg/DL, IgA 125  NG/DL, IgM 87 mg/DL, WBC 8.8, absolute eosinophil 300, absolute lymphocyte 2400, hemoglobin 13.1, platelet 123  Results of a brain MRI obtained 31 July 2020 identified the following:  Mild patchy mucosal thickening in paranasal sinuses, chronic based on a 2020 maxillofacial CT.  Assessment and Plan:   1. CVID (common variable immunodeficiency) ([place])   2. Hypogammaglobulinemia ([place])   3. Other chronic sinusitis   4. Light tobacco smoker <10 cigarettes per day     1.  Use nicotine replacements to eliminate tobacco smoke exposure.    2.  Treat infection:   A.  Omnicef 300 - 1 tablet 2 times per day for 10 days  3.  Treat and prevent inflammation:   A. 200 mls saline + budesonide 0.5 mg ampule 2 times per day  4. If needed:   A. Nasal saline wash  B. Loratadine 10 mg - 1-2 tablet 1 time per day  C. Nystatin - 5 mls swish and swallow 2 times a day if thrush develops  6.  Continue Xembify infusions  7.  Return to clinic in 6 months or earlier if problem  We will treat Pamela Waller with a broad-spectrum antibiotic at the same time that we start her on anti-inflammatory therapy for her airway utilized on a chronic basis.  She will continue to use Xembify to address her hypogammaglobulinemic state.  She should definitely replace her tobacco smoke exposure with a nicotine replacement.  If she develops thrush she can use nystatin.  Assuming she does well with this plan I will see her back in this clinic in 6 months or earlier if there is a problem.  Allena Katz, MD Allergy / Immunology [place]

## 2021-02-20 ENCOUNTER — Encounter: Payer: Self-pay | Admitting: Allergy and Immunology

## 2021-03-15 ENCOUNTER — Other Ambulatory Visit: Payer: Self-pay | Admitting: Internal Medicine

## 2021-03-15 ENCOUNTER — Other Ambulatory Visit (HOSPITAL_BASED_OUTPATIENT_CLINIC_OR_DEPARTMENT_OTHER): Payer: Self-pay

## 2021-03-15 NOTE — Telephone Encounter (Signed)
Please refill as per office routine med refill policy (all routine meds to be refilled for 3 mo or monthly (per pt preference) up to one year from last visit, then month to month grace period for 3 mo, then further med refills will have to be denied) ? ?

## 2021-03-18 ENCOUNTER — Other Ambulatory Visit (HOSPITAL_BASED_OUTPATIENT_CLINIC_OR_DEPARTMENT_OTHER): Payer: Self-pay

## 2021-03-18 MED ORDER — HYDROCHLOROTHIAZIDE 12.5 MG PO CAPS
ORAL_CAPSULE | Freq: Every day | ORAL | 1 refills | Status: DC
  Filled 2021-03-18: qty 90, 90d supply, fill #0

## 2021-03-28 ENCOUNTER — Other Ambulatory Visit (HOSPITAL_BASED_OUTPATIENT_CLINIC_OR_DEPARTMENT_OTHER): Payer: Self-pay

## 2021-03-29 ENCOUNTER — Other Ambulatory Visit (HOSPITAL_BASED_OUTPATIENT_CLINIC_OR_DEPARTMENT_OTHER): Payer: Self-pay

## 2021-04-08 ENCOUNTER — Other Ambulatory Visit (HOSPITAL_BASED_OUTPATIENT_CLINIC_OR_DEPARTMENT_OTHER): Payer: Self-pay

## 2021-04-11 ENCOUNTER — Other Ambulatory Visit (HOSPITAL_BASED_OUTPATIENT_CLINIC_OR_DEPARTMENT_OTHER): Payer: Self-pay

## 2021-04-12 ENCOUNTER — Other Ambulatory Visit (HOSPITAL_BASED_OUTPATIENT_CLINIC_OR_DEPARTMENT_OTHER): Payer: Self-pay

## 2021-04-19 ENCOUNTER — Telehealth: Payer: Self-pay

## 2021-04-19 MED ORDER — HYDROCHLOROTHIAZIDE 12.5 MG PO CAPS: ORAL_CAPSULE | Freq: Every day | ORAL | 0 refills | Status: DC

## 2021-04-19 MED ORDER — ROSUVASTATIN CALCIUM 20 MG PO TABS: ORAL_TABLET | Freq: Every day | ORAL | 0 refills | Status: DC

## 2021-04-19 MED ORDER — PROPRANOLOL HCL ER 60 MG PO CP24: ORAL_CAPSULE | Freq: Every day | ORAL | 0 refills | Status: DC

## 2021-04-19 NOTE — Telephone Encounter (Signed)
Prescriptions sent and patient notified.

## 2021-04-19 NOTE — Telephone Encounter (Signed)
Pt requesting New Rx to be sent to new mail order pharmacy: ?hydrochlorothiazide (MICROZIDE) 12.5 MG capsule ?rosuvastatin (CRESTOR) 20 MG tablet ?propranolol ER (INDERAL LA) 60 MG 24 hr capsule ? ?Pharmacy: ?[facility] [place], [place] ? ?Pt is also requesting 30 day supply be sent to local pharmacy due to her being off the medication for 2 weeks now due to the change of insurance and mail order pharmacy: ?hydrochlorothiazide (MICROZIDE) 12.5 MG capsule ?rosuvastatin (CRESTOR) 20 MG tablet ? ?LOV 07/12/20 ?ROV 05/06/21 ? ? ? ? ?

## 2021-05-06 ENCOUNTER — Ambulatory Visit: Payer: No Typology Code available for payment source | Admitting: Internal Medicine

## 2021-06-23 ENCOUNTER — Encounter: Payer: Self-pay | Admitting: Infectious Diseases

## 2021-07-01 ENCOUNTER — Other Ambulatory Visit: Payer: Self-pay | Admitting: Internal Medicine

## 2021-07-01 NOTE — Telephone Encounter (Signed)
Please refill as per office routine med refill policy (all routine meds to be refilled for 3 mo or monthly (per pt preference) up to one year from last visit, then month to month grace period for 3 mo, then further med refills will have to be denied) ? ?

## 2021-07-05 ENCOUNTER — Other Ambulatory Visit (HOSPITAL_BASED_OUTPATIENT_CLINIC_OR_DEPARTMENT_OTHER): Payer: Self-pay

## 2021-07-17 ENCOUNTER — Encounter: Payer: Self-pay | Admitting: Registered Nurse

## 2021-07-17 ENCOUNTER — Ambulatory Visit: Admitting: Registered Nurse

## 2021-07-17 ENCOUNTER — Other Ambulatory Visit: Payer: Self-pay

## 2021-07-17 VITALS — BP 110/68 | HR 60 | Temp 98.3°F | Resp 18 | Ht 60.0 in | Wt 158.6 lb

## 2021-07-17 DIAGNOSIS — E785 Hyperlipidemia, unspecified: Secondary | ICD-10-CM | POA: Diagnosis not present

## 2021-07-17 DIAGNOSIS — I10 Essential (primary) hypertension: Secondary | ICD-10-CM | POA: Diagnosis not present

## 2021-07-17 DIAGNOSIS — R682 Dry mouth, unspecified: Secondary | ICD-10-CM

## 2021-07-17 DIAGNOSIS — H04123 Dry eye syndrome of bilateral lacrimal glands: Secondary | ICD-10-CM

## 2021-07-17 DIAGNOSIS — R058 Other specified cough: Secondary | ICD-10-CM | POA: Insufficient documentation

## 2021-07-17 DIAGNOSIS — Z832 Family history of diseases of the blood and blood-forming organs and certain disorders involving the immune mechanism: Secondary | ICD-10-CM | POA: Diagnosis not present

## 2021-07-17 MED ORDER — PROPRANOLOL HCL ER 60 MG PO CP24: ORAL_CAPSULE | Freq: Every day | ORAL | 0 refills | Status: DC

## 2021-07-17 MED ORDER — ROSUVASTATIN CALCIUM 20 MG PO TABS: ORAL_TABLET | Freq: Every day | ORAL | 0 refills | Status: DC

## 2021-07-17 MED ORDER — METHYLPREDNISOLONE ACETATE 80 MG/ML IJ SUSP
80.0000 mg | Freq: Once | INTRAMUSCULAR | Status: AC
  Administered 2021-07-17: 80 mg via INTRAMUSCULAR

## 2021-07-17 MED ORDER — HYDROCHLOROTHIAZIDE 12.5 MG PO CAPS: 12.5000 mg | ORAL_CAPSULE | Freq: Every day | ORAL | 0 refills | Status: DC

## 2021-07-17 NOTE — Assessment & Plan Note (Signed)
Continue statin. rechcek labs in 6 mo at CPE.

## 2021-07-17 NOTE — Assessment & Plan Note (Signed)
Well controlled on current regimen. Continue. Recheck in 6 mo at CPE.

## 2021-07-17 NOTE — Assessment & Plan Note (Signed)
Depo medrol IM inj today. Can consider tessalon if pt feels this is needed in coming days. Lungs CTA, low suspicion for secondary bacterial infection after COVID

## 2021-07-17 NOTE — Patient Instructions (Addendum)
[name] to see you / meet you  Call with concerns  Meds refilled x 6 mo  Thank you  Rich     If you have lab work done today you will be contacted with your lab results within the next 2 weeks.  If you have not heard from [place] then please contact us. The fastest way to get your results is to register for My Chart.   IF you received an x-ray today, you will receive an invoice from [facility]. Please contact [facility] at [phone] with questions or concerns regarding your invoice.   IF you received labwork today, you will receive an invoice from [facility]. Please contact [facility] at [phone] with questions or concerns regarding your invoice.   Our billing staff will not be able to assist you with questions regarding bills from these companies.  You will be contacted with the lab results as soon as they are available. The fastest way to get your results is to activate your My Chart account. Instructions are located on the last page of this paperwork. If you have not heard from [place] regarding the results in 2 weeks, please contact this office.

## 2021-07-17 NOTE — Assessment & Plan Note (Signed)
Refer to rheum for assessment and work up

## 2021-07-17 NOTE — Assessment & Plan Note (Signed)
Refer to rheum for assessment and work up  

## 2021-07-17 NOTE — Progress Notes (Signed)
New Patient Office Visit  Subjective:  Patient ID: Pamela Waller, female    DOB: 1965-03-14  Age: 56 y.o. MRN: [id number]  CC:  Chief Complaint  Patient presents with   New Patient (Initial Visit)    Patient states she is here to establish care. Patient is here for a medication refill .    HPI [name] presents to establish care  Medication refill: Hypertension: Patient Currently taking: hctz 12.'5mg'$  po qd, propranolol '60mg'$  ER po qd.  Good effect. No AEs. Denies CV symptoms including: chest pain, shob, doe, headache, visual changes, fatigue, claudication, and dependent edema.   Previous readings and labs: BP Readings from Last 3 Encounters:  07/17/21 110/68  02/19/21 102/62  01/28/21 112/79   Lab Results  Component Value Date   CREATININE 0.75 07/12/2020    HLD On statin x 20+ years.  Lipids stable, higher than goal. Lab Results  Component Value Date   CHOL 212 (H) 07/12/2020   HDL 41.20 07/12/2020   LDLCALC 131 (H) 07/12/2020   TRIG 196.0 (H) 07/12/2020   CHOLHDL 5 07/12/2020    COVID Primary immune deficiency hypogammaglobulinemia.  Tested positive on 07/05/21. She has had this three times previously.  Dry cough has persisted. Occasional brownish production- though she is a smoker.  Hx of chronic sinusitis as well. Chronic malaise/fatigue.   Dry Eyes, Dry mouth  Her ophthalmologist suspects sjogrens.  Did have ANA, sed rate last year, wnl. She has a personal history of psoriasis.  She does have an identical twin sister with multiple autoimmune conditions.  Outpatient Encounter Medications as of 07/17/2021  Medication Sig   budesonide (PULMICORT) 0.5 MG/2ML nebulizer solution Take 2 mLs (0.5 mg total) by nebulization daily.   cetirizine (ZYRTEC) 10 MG tablet Zyrtec 10 mg tablet  Take 1 tablet every day by oral route at bedtime.   cholecalciferol (VITAMIN D3) 25 MCG (1000 UT) tablet Take 5,000 Units by mouth daily. 2000 units daily    clonazePAM (KLONOPIN) 0.5 MG tablet Take 0.5 mg by mouth 3 (three) times daily as needed for anxiety.   clonazePAM (KLONOPIN) 0.5 MG tablet Take 1 tablet by mouth 3 times daily as needed   clonazePAM (KLONOPIN) 0.5 MG tablet Take 1 tablet by mouth 3 times daily as needed   clonazePAM (KLONOPIN) 0.5 MG tablet Take 1 tablet by mouth 3 times daily as needed   clonazePAM (KLONOPIN) 0.5 MG tablet Take 1 tablet by mouth 3 times a day as needed   diphenhydrAMINE (BENADRYL) 12.5 MG/5ML elixir Take by mouth 4 (four) times daily as needed.   EPINEPHrine 0.3 mg/0.3 mL IJ SOAJ injection Inject into the muscle.   fluticasone (FLONASE) 50 MCG/ACT nasal spray Place 1 spray into both nostrils 2 (two) times daily.   Immune Globulin, Human,-klhw (XEMBIFY) 10 GM/50ML SOLN Inject 20 g into the skin every 14 (fourteen) days.   influenza vac split quadrivalent PF (FLUARIX) 0.5 ML injection Inject into the muscle.   meclizine (ANTIVERT) 25 MG tablet Take 1 tablet (25 mg total) by mouth 3 (three) times daily as needed for dizziness.   promethazine (PHENERGAN) 12.5 MG tablet Take 1 tablet (12.5 mg total) by mouth every 8 (eight) hours as needed for nausea or vomiting. May cause drowsiness   sertraline (ZOLOFT) 100 MG tablet Take 200 mg by mouth daily.   sertraline (ZOLOFT) 100 MG tablet Take 2 tablets by mouth daily   sertraline (ZOLOFT) 100 MG tablet Take 2 tablets by mouth daily  sertraline (ZOLOFT) 100 MG tablet Take 2 tablets by mouth daily   sulfamethoxazole-trimethoprim (BACTRIM DS) 800-160 MG tablet Take 1 tablet by mouth 2 (two) times daily.   vitamin B-12 (CYANOCOBALAMIN) 1000 MCG tablet Take 1 tablet (1,000 mcg total) by mouth daily.   [DISCONTINUED] hydrochlorothiazide (MICROZIDE) 12.5 MG capsule Take 1 capsule (12.5 mg total) by mouth daily. Patient needs a routine well visit.   [DISCONTINUED] propranolol ER (INDERAL LA) 60 MG 24 hr capsule TAKE 1 CAPSULE BY MOUTH ONCE DAILY   [DISCONTINUED] rosuvastatin  (CRESTOR) 20 MG tablet TAKE 1 TABLET BY MOUTH ONCE A DAY   hydrochlorothiazide (MICROZIDE) 12.5 MG capsule Take 1 capsule (12.5 mg total) by mouth daily. Patient needs a routine well visit.   propranolol ER (INDERAL LA) 60 MG 24 hr capsule TAKE 1 CAPSULE BY MOUTH ONCE DAILY   rosuvastatin (CRESTOR) 20 MG tablet TAKE 1 TABLET BY MOUTH ONCE A DAY   [DISCONTINUED] azithromycin (ZITHROMAX) 250 MG tablet Take first 2 tablets together, then 1 every day until finished.   [DISCONTINUED] fluconazole (DIFLUCAN) 150 MG tablet Take 1 tablet (150 mg total) by mouth daily.   [DISCONTINUED] nystatin (MYCOSTATIN) 100000 UNIT/ML suspension Take 5 mLs (500,000 Units total) by mouth 2 (two) times daily as needed.   Facility-Administered Encounter Medications as of 07/17/2021  Medication   methylPREDNISolone acetate (DEPO-MEDROL) injection 80 mg    Past Medical History:  Diagnosis Date   Allergy    Chronic sinusitis 12/23/2018   Depression    Endometriosis    GERD (gastroesophageal reflux disease)    Hyperlipidemia    Hypertension    Hypogammaglobulinemia (HCC)    sub class 1 and 3 deficiency   Panic attacks    Sinusitis    Vitamin B12 deficiency 12/23/2018   Vitamin D deficiency 12/23/2018    Past Surgical History:  Procedure Laterality Date   ABDOMINAL HYSTERECTOMY     BLADDER SURGERY     BREAST LUMPECTOMY     CESAREAN SECTION     KNEE SURGERY     NASAL SEPTUM SURGERY     SINUSOTOMY      Family History  Problem Relation Age of Onset   Asthma Mother    Arthritis Mother    Breast cancer Mother    Pulmonary fibrosis Father    Hyperlipidemia Father    Hypertension Father    Breast cancer Sister    Stomach cancer Neg Hx    Colon cancer Neg Hx    Pancreatic cancer Neg Hx    Liver disease Neg Hx    Esophageal cancer Neg Hx     Social History   Socioeconomic History   Marital status: Widowed    Spouse name: Not on file   Number of children: Not on file   Years of education: Not on  file   Highest education level: Not on file  Occupational History   Not on file  Tobacco Use   Smoking status: Every Day    Packs/day: 0.50    Years: 28.00    Total pack years: 14.00    Types: Cigarettes   Smokeless tobacco: Never  Vaping Use   Vaping Use: Every day  Substance and Sexual Activity   Alcohol use: Yes    Comment: occ   Drug use: Never   Sexual activity: Not on file  Other Topics Concern   Not on file  Social History Narrative   Not on file   Social Determinants of Health   Financial  Resource Strain: Not on file  Food Insecurity: Not on file  Transportation Needs: Not on file  Physical Activity: Not on file  Stress: Not on file  Social Connections: Not on file  Intimate Partner Violence: Not on file    ROS Review of Systems  Constitutional: Negative.   HENT: Negative.    Eyes: Negative.   Respiratory: Negative.    Cardiovascular: Negative.   Gastrointestinal: Negative.   Genitourinary: Negative.   Musculoskeletal: Negative.   Skin: Negative.   Neurological: Negative.   Psychiatric/Behavioral: Negative.    All other systems reviewed and are negative.   Objective:   Today's Vitals: BP 110/68   Pulse 60   Temp 98.3 F (36.8 C) (Temporal)   Resp 18   Ht 5' (1.524 m)   Wt 158 lb 9.6 oz (71.9 kg)   SpO2 97%   BMI 30.97 kg/m   Physical Exam Vitals and nursing note reviewed.  Constitutional:      General: She is not in acute distress.    Appearance: Normal appearance. She is normal weight. She is not ill-appearing, toxic-appearing or diaphoretic.  Cardiovascular:     Rate and Rhythm: Normal rate and regular rhythm.     Heart sounds: Normal heart sounds. No murmur heard.    No friction rub. No gallop.  Pulmonary:     Effort: Pulmonary effort is normal. No respiratory distress.     Breath sounds: Normal breath sounds. No stridor. No wheezing, rhonchi or rales.  Chest:     Chest wall: No tenderness.  Skin:    General: Skin is warm and  dry.  Neurological:     General: No focal deficit present.     Mental Status: She is alert and oriented to person, place, and time. Mental status is at baseline.  Psychiatric:        Mood and Affect: Mood normal.        Behavior: Behavior normal.        Thought Content: Thought content normal.        Judgment: Judgment normal.         Assessment & Plan:   Problem List Items Addressed This Visit       Cardiovascular and Mediastinum   Benign essential hypertension    Well controlled on current regimen. Continue. Recheck in 6 mo at CPE.       Relevant Medications   propranolol ER (INDERAL LA) 60 MG 24 hr capsule   rosuvastatin (CRESTOR) 20 MG tablet   hydrochlorothiazide (MICROZIDE) 12.5 MG capsule     Digestive   Dry mouth and eyes - Primary    Refer to rheum for assessment and work up      Relevant Orders   Ambulatory referral to Rheumatology     Other   Hyperlipidemia LDL goal <100    Continue statin. rechcek labs in 6 mo at CPE.       Relevant Medications   propranolol ER (INDERAL LA) 60 MG 24 hr capsule   rosuvastatin (CRESTOR) 20 MG tablet   hydrochlorothiazide (MICROZIDE) 12.5 MG capsule   Dry cough    Depo medrol IM inj today. Can consider tessalon if pt feels this is needed in coming days. Lungs CTA, low suspicion for secondary bacterial infection after COVID      Relevant Medications   methylPREDNISolone acetate (DEPO-MEDROL) injection 80 mg (Start on 07/17/2021 12:45 PM)   Family history of autoimmune disorder    Refer to rheum for assessment and  work up       Relevant Orders   Ambulatory referral to Rheumatology    Follow-up: Return in about 6 months (around 01/16/2022) for CPE and labs.   Maximiano Coss, NP

## 2021-08-20 ENCOUNTER — Ambulatory Visit: Admitting: Allergy and Immunology

## 2021-08-20 ENCOUNTER — Ambulatory Visit: Payer: No Typology Code available for payment source | Admitting: Allergy and Immunology

## 2021-09-19 ENCOUNTER — Telehealth: Payer: Self-pay | Admitting: Allergy and Immunology

## 2021-09-19 NOTE — Telephone Encounter (Signed)
Called patient - DOB verified - advised of provider notation below. Patient advised lab order will be placed as a future order. Patient advised our office will be in the new building - [address] and to make sure to advise the front staff that she's there for lab work only.  Patient stated she will come on Monday, 10/14/21 to have the blood work done.  Patient verbalized understanding, no further questions.  Lab order placed.  Forwarding message to provider as update.

## 2021-09-19 NOTE — Telephone Encounter (Signed)
Chanell called in to make an appointment and made one in October.  Alithea wants to know if Dr. Neldon Mc will order IgE blood work to see if Pamela Waller is working before that appointment so they can discuss the results at the October appointment.  Aiyanna states she has has covid several times and she just doesn't feel it is working.  Please advise.

## 2021-09-19 NOTE — Telephone Encounter (Signed)
Please inform Pamela Waller that she can come to the clinic 1 week before the appointment in October to have her blood drawn for a serum IgG level.  Immunoglobulin infusions are not the treatment for COVID infections so it is not surprising that she still continues to have recurrent COVID while using immunoglobulin infusions.

## 2021-09-25 ENCOUNTER — Telehealth: Payer: Self-pay | Admitting: Registered Nurse

## 2021-09-25 ENCOUNTER — Other Ambulatory Visit: Payer: Self-pay

## 2021-09-25 DIAGNOSIS — I10 Essential (primary) hypertension: Secondary | ICD-10-CM

## 2021-09-25 MED ORDER — PROPRANOLOL HCL ER 60 MG PO CP24: ORAL_CAPSULE | Freq: Every day | ORAL | 0 refills | Status: DC

## 2021-09-25 MED ORDER — HYDROCHLOROTHIAZIDE 12.5 MG PO CAPS: 12.5000 mg | ORAL_CAPSULE | Freq: Every day | ORAL | 0 refills | Status: DC

## 2021-09-25 NOTE — Telephone Encounter (Signed)
Refills sent in . Attached a note as well stating pt will need to est care w/ new provider

## 2021-09-25 NOTE — Telephone Encounter (Signed)
Encourage patient to contact the pharmacy for refills or they can request refills through [facility]  (Please schedule appointment if patient has not been seen in over a year)    WHAT Pamela Waller TO: [facility] [phone]  MEDICATION NAME & DOSE: propanolol er 12.5 mg AND hydrochlorothiazide  NOTES/COMMENTS FROM PATIENT: pt aware that needs to establis new pcp      Front office please notify patient: It takes 48-72 hours to process rx refill requests Ask patient to call pharmacy to ensure rx is ready before heading there.

## 2021-10-10 IMAGING — DX DG CHEST 2V
2 series · 2 of 2 positions shown · non-contrast
Comparison: None.

CLINICAL DATA: Left upper back pain for 3 weeks, smoker

EXAM:
CHEST - 2 VIEW

[chest pa]
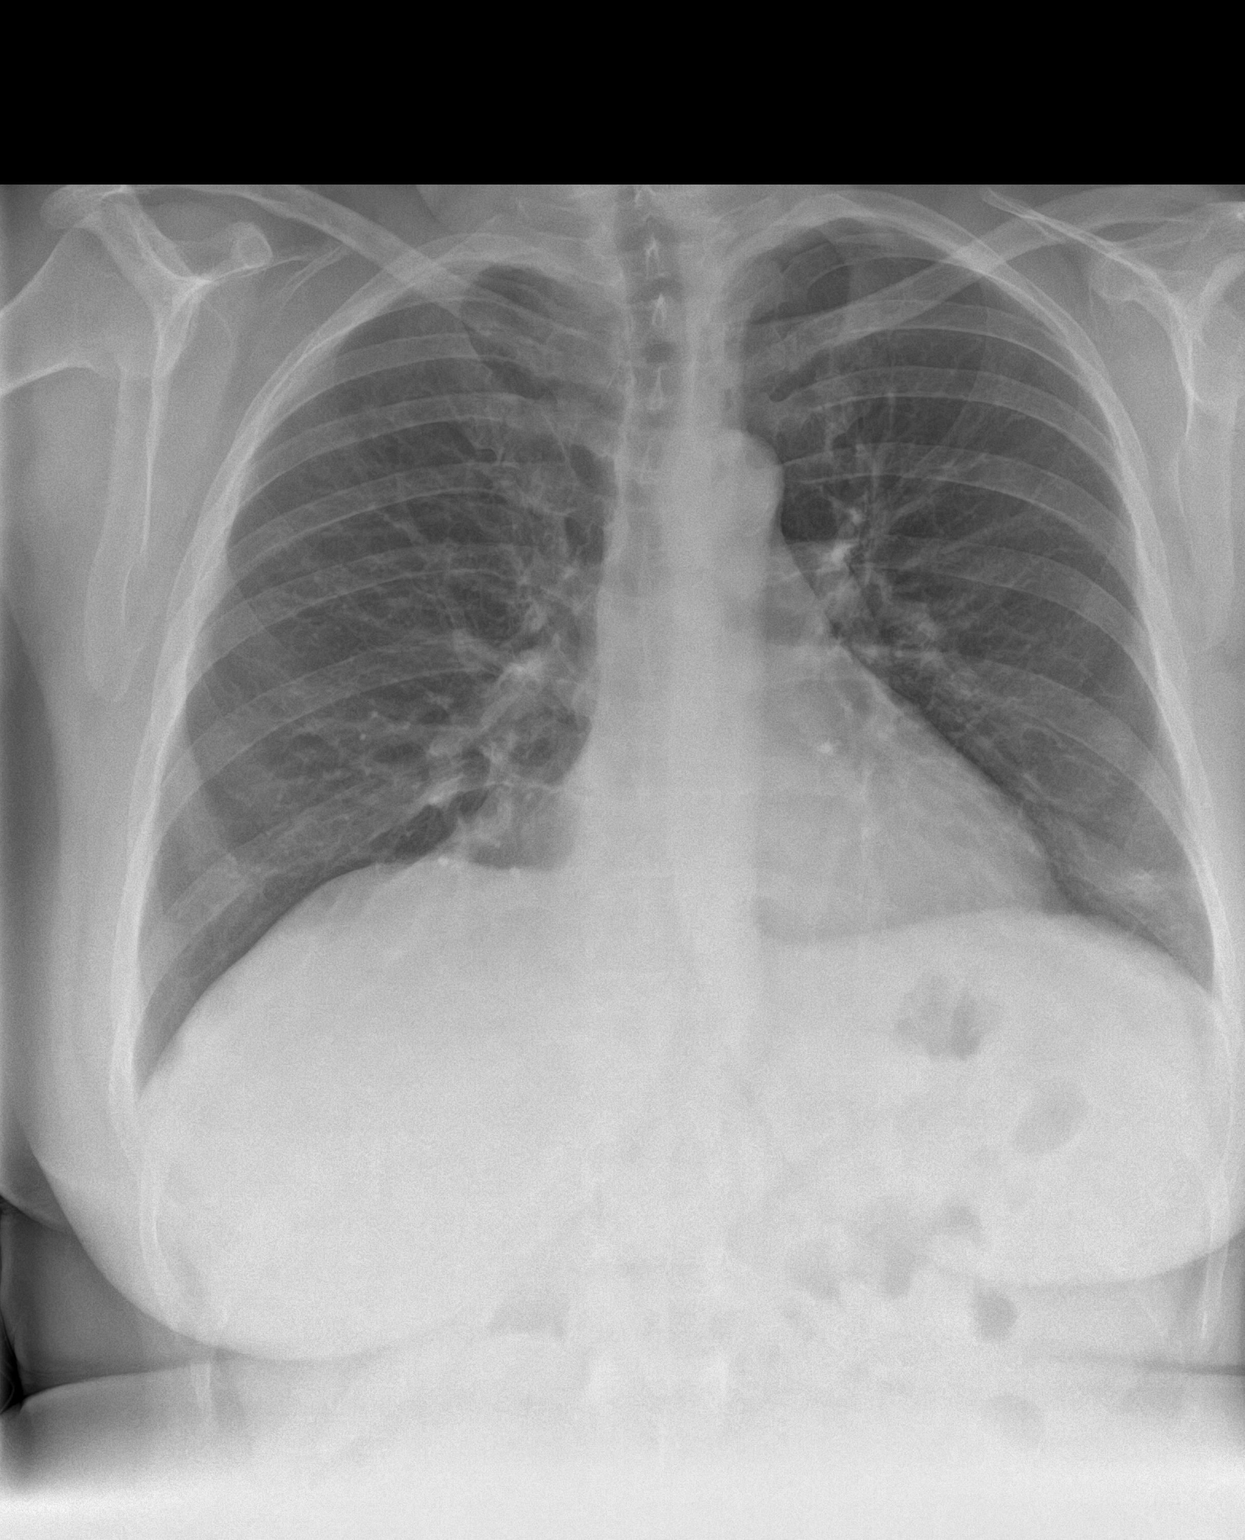

[chest lat]
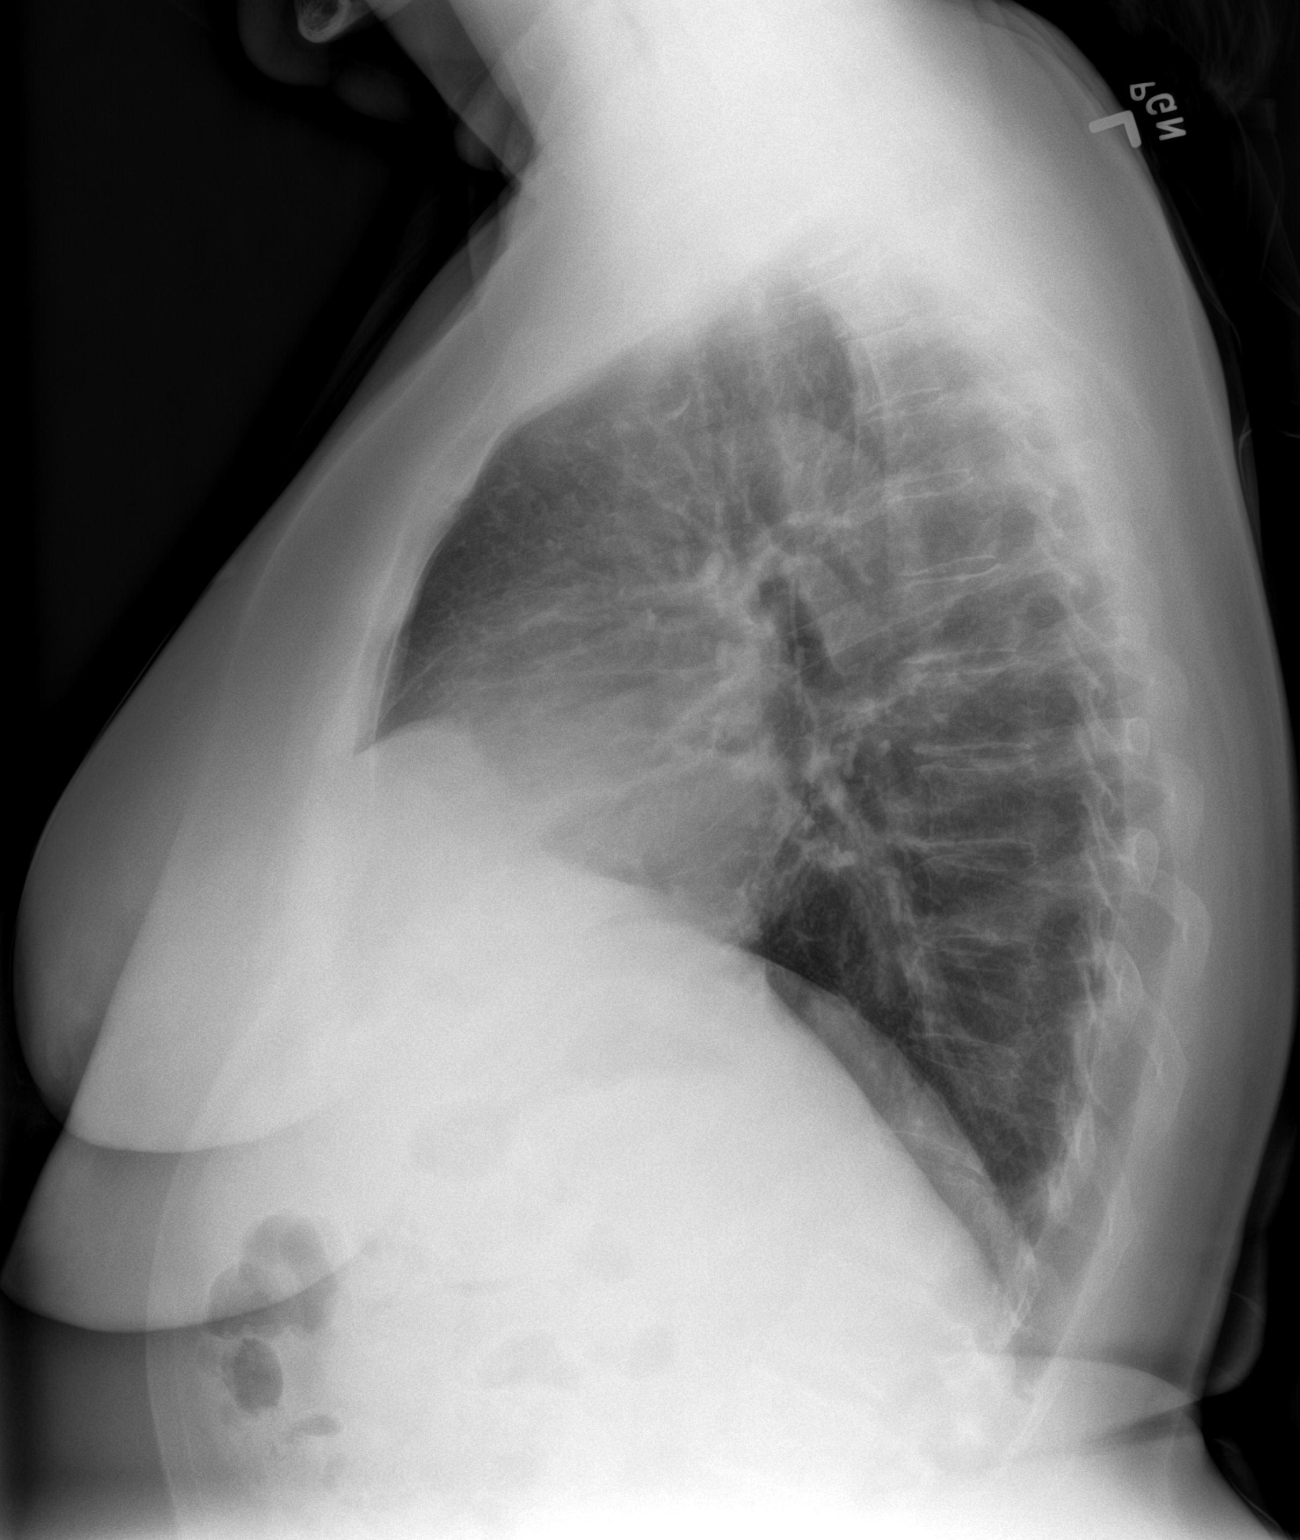

[2 of 2 positions shown; findings below may reference images not displayed]

FINDINGS: Stable cardiomediastinal silhouette with normal heart size. No
pneumothorax. No pleural effusion. Thin bandlike linear opacity in
the lingula. Otherwise clear lungs, with no pulmonary edema.
IMPRESSION: Thin bandlike linear opacity in the lingula, favor scarring or
atelectasis. Otherwise no active cardiopulmonary disease.

## 2021-10-29 ENCOUNTER — Telehealth: Payer: Self-pay

## 2021-10-29 ENCOUNTER — Ambulatory Visit (INDEPENDENT_AMBULATORY_CARE_PROVIDER_SITE_OTHER): Admitting: Allergy and Immunology

## 2021-10-29 VITALS — BP 122/86 | HR 70 | Temp 98.2°F | Resp 18 | Ht 59.0 in | Wt 157.4 lb

## 2021-10-29 DIAGNOSIS — D801 Nonfamilial hypogammaglobulinemia: Secondary | ICD-10-CM

## 2021-10-29 DIAGNOSIS — D839 Common variable immunodeficiency, unspecified: Secondary | ICD-10-CM

## 2021-10-29 DIAGNOSIS — M3501 Sicca syndrome with keratoconjunctivitis: Secondary | ICD-10-CM

## 2021-10-29 DIAGNOSIS — F17210 Nicotine dependence, cigarettes, uncomplicated: Secondary | ICD-10-CM | POA: Diagnosis not present

## 2021-10-29 DIAGNOSIS — J328 Other chronic sinusitis: Secondary | ICD-10-CM | POA: Diagnosis not present

## 2021-10-29 NOTE — Telephone Encounter (Signed)
Please send a referral to rheumatologist for possible Sjogren syndrome. Per Dr. Neldon Mc

## 2021-10-29 NOTE — Patient Instructions (Addendum)
  1.  Continue slow taper of smoke exposure   2. Continue Xembify infusions  3. If needed:   A. Nasal saline wash  B. Loratadine 10 mg - 1-2 tablet 1 time per day  C. Nystatin - 5 mls swish and swallow 2 times a day if thrush develops  D.  Biotene mouthwash  E.  Systane eyedrops  4.  Referral to rheumatologist for possible Sjogren syndrome  5.  Paxlovid if infected with COVID  6.  Pilocarpine???  7.  Change of immunoglobulin infusion???  8.  Blood -IgA/G/M/E  9.  Obtain fall flu vaccine  10.  Return to clinic in 6 months or earlier if problem

## 2021-10-29 NOTE — Progress Notes (Unsigned)
Pamela Waller   Follow-up Note  Referring Provider: Maximiano Coss, NP Primary Provider: Maximiano Coss, NP Date of Office Visit: 10/29/2021  Subjective:   Pamela Waller (DOB: 1965-06-12) is a 56 y.o. female who returns to the [facility] on 10/29/2021 in re-evaluation of the following:  HPI: Pamela Waller returns to this clinic in evaluation of hypogammaglobulinemia/CVID, reflux, tobacco smoking.  I last saw her in this clinic on 19 February 2021.  She continues to use immunoglobulin infusions.  She has not had any sinus infections or other respiratory tract infections.  She has had several COVID infections which will be expanded upon below.  She does note that she gets some irritation with her immunoglobulin infusion.  It is not so much the needle puncture as it is the sensation that she feels afterwards.  She does not develop any knots or discoloration of the area where she is infused and certainly has no associated systemic or constitutional symptoms when receiving this infusion.  She has sicca syndrome and she also appears to have diffuse muscle aches and joint aches on a chronic basis and also has a fair amount of fatigue.  She uses some wetting solutions for her mouth and fortunately she has not developed any problems with recurrent thrush.  She tells me that she has been infected twice with COVID this year with her last infection being her worst.  She gets a lot of systemic and constitutional symptoms and lots of respiratory tract symptoms when she gets infected with COVID.  She has not treated with Paxlovid whenever she gets infected with COVID.  In toto, she has been infected for times with COVID and she has received 3 immunizations with COVID vaccines.  She continues to smoke.  Currently she is smoking 11 cigarettes/day.  Her plan is to taper down to 1 cigarette/month.  Allergies as of 10/29/2021       Reactions   Dilaudid  [hydromorphone Hcl] Anaphylaxis   Morphine And Related Anaphylaxis   Iodinated Contrast Media Hives   IVP dye.    Codeine Other (See Comments)   Other Hives   Penicillins Hives, Other (See Comments)   Percocet [oxycodone-acetaminophen] Nausea Only   Prednisone    psychosis   Prozac [fluoxetine Hcl]         Medication List    cetirizine 10 MG tablet Commonly known as: ZYRTEC Zyrtec 10 mg tablet  Take 1 tablet every day by oral route at bedtime.   cholecalciferol 25 MCG (1000 UNIT) tablet Commonly known as: VITAMIN D3 Take 5,000 Units by mouth daily. 2000 units daily   clonazePAM 0.5 MG tablet Commonly known as: KlonoPIN Take 1 tablet by mouth 3 times a day as needed   cyanocobalamin 1000 MCG tablet Commonly known as: VITAMIN B12 Take 1 tablet (1,000 mcg total) by mouth daily.   diphenhydrAMINE 12.5 MG/5ML elixir Commonly known as: BENADRYL Take by mouth 4 (four) times daily as needed.   EPINEPHrine 0.3 mg/0.3 mL Soaj injection Commonly known as: EPI-PEN Inject into the muscle.   fluticasone 50 MCG/ACT nasal spray Commonly known as: FLONASE Place 1 spray into both nostrils 2 (two) times daily.   hydrochlorothiazide 12.5 MG capsule Commonly known as: MICROZIDE Take 1 capsule (12.5 mg total) by mouth daily. Patient needs a routine well visit.   meclizine 25 MG tablet Commonly known as: ANTIVERT Take 1 tablet (25 mg total) by mouth 3 (three) times daily as  needed for dizziness.   promethazine 12.5 MG tablet Commonly known as: PHENERGAN Take 1 tablet (12.5 mg total) by mouth every 8 (eight) hours as needed for nausea or vomiting. May cause drowsiness   propranolol ER 60 MG 24 hr capsule Commonly known as: INDERAL LA TAKE 1 CAPSULE BY MOUTH ONCE DAILY   rosuvastatin 20 MG tablet Commonly known as: CRESTOR TAKE 1 TABLET BY MOUTH ONCE A DAY   sertraline 100 MG tablet Commonly known as: Zoloft Take 2 tablets by mouth daily   Xembify 10 GM/50ML  Soln Generic drug: Immune Globulin (Human)-klhw Inject 20 g into the skin every 14 (fourteen) days.    Past Medical History:  Diagnosis Date   Allergy    Chronic sinusitis 12/23/2018   Depression    Endometriosis    GERD (gastroesophageal reflux disease)    Hyperlipidemia    Hypertension    Hypogammaglobulinemia (HCC)    sub class 1 and 3 deficiency   Panic attacks    Sinusitis    Vitamin B12 deficiency 12/23/2018   Vitamin D deficiency 12/23/2018    Past Surgical History:  Procedure Laterality Date   ABDOMINAL HYSTERECTOMY     BLADDER SURGERY     BREAST LUMPECTOMY     CESAREAN SECTION     KNEE SURGERY     NASAL SEPTUM SURGERY     SINUSOTOMY      Review of systems negative except as noted in HPI / PMHx or noted below:  Review of Systems  Constitutional: Negative.   HENT: Negative.    Eyes: Negative.   Respiratory: Negative.    Cardiovascular: Negative.   Gastrointestinal: Negative.   Genitourinary: Negative.   Musculoskeletal: Negative.   Skin: Negative.   Neurological: Negative.   Endo/Heme/Allergies: Negative.   Psychiatric/Behavioral: Negative.       Objective:   Vitals:   10/29/21 1335  BP: 122/86  Pulse: 70  Resp: 18  Temp: 98.2 F (36.8 C)  SpO2: 95%   Height: '4\' 11"'$  (149.9 cm)  Weight: 157 lb 6.4 oz (71.4 kg)   Physical Exam Constitutional:      Appearance: She is not diaphoretic.  HENT:     Head: Normocephalic.     Right Ear: Tympanic membrane, ear canal and external ear normal.     Left Ear: Tympanic membrane, ear canal and external ear normal.     Nose: Nose normal. No mucosal edema or rhinorrhea.     Mouth/Throat:     Pharynx: Uvula midline. No oropharyngeal exudate.  Eyes:     Conjunctiva/sclera: Conjunctivae normal.  Neck:     Thyroid: No thyromegaly.     Trachea: Trachea normal. No tracheal tenderness or tracheal deviation.  Cardiovascular:     Rate and Rhythm: Normal rate and regular rhythm.     Heart sounds: Normal  heart sounds, S1 normal and S2 normal. No murmur heard. Pulmonary:     Effort: No respiratory distress.     Breath sounds: Normal breath sounds. No stridor. No wheezing or rales.  Lymphadenopathy:     Head:     Right side of head: No tonsillar adenopathy.     Left side of head: No tonsillar adenopathy.     Cervical: No cervical adenopathy.  Skin:    Findings: No erythema or rash.     Nails: There is no clubbing.  Neurological:     Mental Status: She is alert.     Diagnostics: none  Assessment and Plan:   1.  CVID (common variable immunodeficiency) ([place])   2. Hypogammaglobulinemia ([place])   3. Other chronic sinusitis   4. Light tobacco smoker <10 cigarettes per day   5. Sjogren's syndrome with keratoconjunctivitis sicca ([place])    1.  Continue slow taper of smoke exposure   2. Continue Xembify infusions  3. If needed:   A. Nasal saline wash  B. Loratadine 10 mg - 1-2 tablet 1 time per day  C. Nystatin - 5 mls swish and swallow 2 times a day if thrush develops  D.  Biotene mouthwash  E.  Systane eyedrops  4.  Referral to rheumatologist for possible Sjogren syndrome  5.  Paxlovid if infected with COVID  6.  Pilocarpine???  7.  Change of immunoglobulin infusion???  8.  Blood -IgA/G/M/E  9.  Obtain fall flu vaccine  10.  Return to clinic in 6 months or earlier if problem  Hadja will continue to use immunoglobulin infusions for her hypogammaglobulinemia which have really worked well in preventing her from developing respiratory tract infections.  She is having some local infusion problems with Xembify and she is going to make a decision about whether or not she needs to change to a different form of the immunoglobulin infusion.  She has dry mouth and dry eyes and has a fair amount of systemic symptoms with achiness of muscles and joints and fatigue and I think it be best for her to be screened by her rheumatologist for possible Sjogren's syndrome.  It will be difficult to  make this diagnosis based upon immunoglobulin diagnostic testing as she is receiving immunoglobulin infusions on a consistent basis.  I did make the recommendation that she use Paxlovid during her next COVID infection as she has had a difficult time with COVID infections and in fact her last infection was her worst infection.  She has the option of using pilocarpine to address her dry mouth and she will read up on this agent and make a decision about starting this agent.  Allena Katz, MD [facility] [place]

## 2021-10-30 ENCOUNTER — Encounter: Payer: Self-pay | Admitting: Allergy and Immunology

## 2021-10-31 LAB — IGG, IGA, IGM
IgA/Immunoglobulin A, Serum: 119 mg/dL (ref 87–352)
IgG (Immunoglobin G), Serum: 865 mg/dL (ref 586–1602)
IgM (Immunoglobulin M), Srm: 87 mg/dL (ref 26–217)

## 2021-10-31 LAB — IGE: IgE (Immunoglobulin E), Serum: 40 IU/mL (ref 6–495)

## 2021-11-07 ENCOUNTER — Encounter: Payer: Self-pay | Admitting: Internal Medicine

## 2021-11-07 ENCOUNTER — Ambulatory Visit: Admitting: Internal Medicine

## 2021-11-07 VITALS — BP 120/58 | HR 55 | Temp 98.2°F | Resp 14 | Ht 59.0 in | Wt 159.6 lb

## 2021-11-07 DIAGNOSIS — L538 Other specified erythematous conditions: Secondary | ICD-10-CM | POA: Diagnosis not present

## 2021-11-07 DIAGNOSIS — M5136 Other intervertebral disc degeneration, lumbar region: Secondary | ICD-10-CM

## 2021-11-07 DIAGNOSIS — I10 Essential (primary) hypertension: Secondary | ICD-10-CM

## 2021-11-07 DIAGNOSIS — R928 Other abnormal and inconclusive findings on diagnostic imaging of breast: Secondary | ICD-10-CM | POA: Diagnosis not present

## 2021-11-07 DIAGNOSIS — F411 Generalized anxiety disorder: Secondary | ICD-10-CM | POA: Diagnosis not present

## 2021-11-07 DIAGNOSIS — E559 Vitamin D deficiency, unspecified: Secondary | ICD-10-CM

## 2021-11-07 DIAGNOSIS — G4710 Hypersomnia, unspecified: Secondary | ICD-10-CM

## 2021-11-07 DIAGNOSIS — G609 Hereditary and idiopathic neuropathy, unspecified: Secondary | ICD-10-CM

## 2021-11-07 DIAGNOSIS — M5030 Other cervical disc degeneration, unspecified cervical region: Secondary | ICD-10-CM

## 2021-11-07 DIAGNOSIS — E538 Deficiency of other specified B group vitamins: Secondary | ICD-10-CM

## 2021-11-07 DIAGNOSIS — F410 Panic disorder [episodic paroxysmal anxiety] without agoraphobia: Secondary | ICD-10-CM

## 2021-11-07 DIAGNOSIS — K2970 Gastritis, unspecified, without bleeding: Secondary | ICD-10-CM

## 2021-11-07 DIAGNOSIS — J301 Allergic rhinitis due to pollen: Secondary | ICD-10-CM

## 2021-11-07 DIAGNOSIS — M51369 Other intervertebral disc degeneration, lumbar region without mention of lumbar back pain or lower extremity pain: Secondary | ICD-10-CM | POA: Insufficient documentation

## 2021-11-07 DIAGNOSIS — R413 Other amnesia: Secondary | ICD-10-CM

## 2021-11-07 DIAGNOSIS — Z23 Encounter for immunization: Secondary | ICD-10-CM

## 2021-11-07 DIAGNOSIS — K760 Fatty (change of) liver, not elsewhere classified: Secondary | ICD-10-CM

## 2021-11-07 DIAGNOSIS — H539 Unspecified visual disturbance: Secondary | ICD-10-CM

## 2021-11-07 DIAGNOSIS — E785 Hyperlipidemia, unspecified: Secondary | ICD-10-CM

## 2021-11-07 HISTORY — DX: Fatty (change of) liver, not elsewhere classified: K76.0

## 2021-11-07 HISTORY — DX: Other specified erythematous conditions: L53.8

## 2021-11-07 HISTORY — DX: Other intervertebral disc degeneration, lumbar region without mention of lumbar back pain or lower extremity pain: M51.369

## 2021-11-07 HISTORY — DX: Other cervical disc degeneration, unspecified cervical region: M50.30

## 2021-11-07 MED ORDER — ROSUVASTATIN CALCIUM 20 MG PO TABS: ORAL_TABLET | Freq: Every day | ORAL | 0 refills | Status: DC

## 2021-11-07 MED ORDER — PROPRANOLOL HCL ER 60 MG PO CP24: ORAL_CAPSULE | Freq: Every day | ORAL | 0 refills | Status: DC
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
## 2021-11-07 NOTE — Assessment & Plan Note (Signed)
Strongly this problem is connected to the severe snoring with oxygen drops that she mentioned and so I advised her to be sure to get back with me if she is not making tremendous progress on the snoring with the snore lab and we need to do a CPAP
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
## 2021-11-07 NOTE — Assessment & Plan Note (Signed)
For physical therapy and pain clinic and she is going to start with just the physical therapy

## 2021-11-07 NOTE — Assessment & Plan Note (Signed)
Sending to physical therapy for this and open offered for repeat x-ray MRI and pain clinic if she calls and asks no appointment needed

## 2021-11-07 NOTE — Assessment & Plan Note (Signed)
Takes 1000 units daily I recommend increasing to 2 to 5000 units daily

## 2021-11-07 NOTE — Assessment & Plan Note (Signed)
Did not like Cymbalta Effexor gabapentin or Lyrica She does take B12 which I encouraged her to continue

## 2021-11-07 NOTE — Assessment & Plan Note (Signed)
She saw this on a CT in the past New she is up-to-date on hep a and B vaccine GI referral she is going to decline for now and just do the lab work-up and an ultrasound to see where we are at and I encouraged her to go on a very low-carb diet and she already has a nondrinker and will continue to not drink and I also advised her to not take Tylenol anymore

## 2021-11-07 NOTE — Assessment & Plan Note (Signed)
Controlled, stableCounseled limit salt, limit alcohol, nsaids, weight gain. Goal 140/90 explained - Encouraged home blood pressure monitoring No need for resistant htn w/u yet Continue:  Current hypertension medications:      Sig   hydrochlorothiazide (MICROZIDE) 12.5 MG capsule (Taking) Take 1 capsule (12.5 mg total) by mouth daily. Patient needs a routine well visit.   propranolol ER (INDERAL LA) 60 MG 24 hr capsule (Taking) TAKE 1 CAPSULE BY MOUTH ONCE DAILY     She feels like hydrochlorothiazide is drying her out and she gets dizzy with it and she is getting home blood pressures under 100 so I encouraged her to stop it and just keep an eye on the blood pressure after and as long as it does not go over 140 then great for her  Offered to refill meds. Risks of poor long term control are stroke and heart attack so I encourage close monitoring and follow up. Go to ER if blood pressure over 180 AND you have new headache, shortness of breath, confusion, or chest discomfort, otherwise follow up by MyChart or appointment.  See AFTER VISIT SUMMARY for addition educational information provided

## 2021-11-07 NOTE — Assessment & Plan Note (Signed)
Encouraged her to download snore lab and experiment with that to try to get better control using the exercises and treatments that are recommended there and if that does not adequately control I offered to order CPAP and get adjustments to get it comfortable which will certainly control it
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
## 2021-11-08 ENCOUNTER — Encounter: Payer: Self-pay | Admitting: Internal Medicine

## 2021-11-08 NOTE — Progress Notes (Signed)
I have reviewed your recent results and, in my medical opinion:  Lites have been slightly improving over the last couple years I think probably due to the fixing of the B12 but there is still a little low most likely cause would be some fatty liver disease I think we should just keep monitoring it I can maybe do some extra tests on the next blood draw but I would not push it up within every 6 months or so.  Your cholesterol is pretty bad but you get the Crestor now and I think if you take it consistently we will see a big improvement but if you like to go to a higher dose like 40 mg on it we can send that in for you.  You barely have enough vitamin D and so I want you to keep taking vitamin D tablets for the rest of your life may be 1 or 2000 units a day  Loralee Pacas, MD  11/08/2021 11:21 AM

## 2021-11-09 LAB — NASH FIBROSURE(R) PLUS
ALPHA 2-MACROGLOBULINS, QN: 140 mg/dL (ref 110–276)
ALT (SGPT) P5P: 11 IU/L (ref 0–40)
AST (SGOT) P5P: 20 IU/L (ref 0–40)
Apolipoprotein A-1: 131 mg/dL (ref 116–209)
Bilirubin, Total: 0.3 mg/dL (ref 0.0–1.2)
Cholesterol, Total: 284 mg/dL — ABNORMAL HIGH (ref 100–199)
Fibrosis Score: 0.05 (ref 0.00–0.21)
GGT: 9 IU/L (ref 0–60)
Glucose: 93 mg/dL (ref 70–99)
Haptoglobin: 140 mg/dL (ref 33–346)
NASH Score: 0.21 (ref 0.00–0.25)
Steatosis Score: 0.45 — ABNORMAL HIGH (ref 0.00–0.40)
Triglycerides: 215 mg/dL — ABNORMAL HIGH (ref 0–149)

## 2021-11-10 ENCOUNTER — Encounter: Payer: Self-pay | Admitting: Internal Medicine

## 2021-11-10 NOTE — Progress Notes (Signed)
I have reviewed your recent results and, in my medical opinion:  Great news!- almost no significant liver damage from the fatty liver .Marland Kitchen  No further follow up needed unless new issue cdmes up  Loralee Pacas, MD  11/10/2021 9:59 AM

## 2021-11-11 LAB — COMPLETE METABOLIC PANEL WITH GFR
AG Ratio: 1.8 (calc) (ref 1.0–2.5)
ALT: 8 U/L (ref 6–29)
AST: 14 U/L (ref 10–35)
Albumin: 4.6 g/dL (ref 3.6–5.1)
Alkaline phosphatase (APISO): 67 U/L (ref 37–153)
BUN: 15 mg/dL (ref 7–25)
CO2: 26 mmol/L (ref 20–32)
Calcium: 10 mg/dL (ref 8.6–10.4)
Chloride: 106 mmol/L (ref 98–110)
Creat: 0.77 mg/dL (ref 0.50–1.03)
Globulin: 2.6 g/dL (calc) (ref 1.9–3.7)
Glucose, Bld: 89 mg/dL (ref 65–99)
Potassium: 4.2 mmol/L (ref 3.5–5.3)
Sodium: 139 mmol/L (ref 135–146)
Total Bilirubin: 0.5 mg/dL (ref 0.2–1.2)
Total Protein: 7.2 g/dL (ref 6.1–8.1)
eGFR: 90 mL/min/{1.73_m2} (ref >=60)

## 2021-11-11 LAB — CBC WITH DIFFERENTIAL/PLATELET
Absolute Monocytes: 270 cells/uL (ref 200–950)
Basophils Absolute: 42 cells/uL (ref 0–200)
Basophils Relative: 0.8 %
Eosinophils Absolute: 148 cells/uL (ref 15–500)
Eosinophils Relative: 2.8 %
HCT: 43 % (ref 35.0–45.0)
Hemoglobin: 14.7 g/dL (ref 11.7–15.5)
Lymphs Abs: 1701 cells/uL (ref 850–3900)
MCH: 30.8 pg (ref 27.0–33.0)
MCHC: 34.2 g/dL (ref 32.0–36.0)
MCV: 90.1 fL (ref 80.0–100.0)
MPV: 12.8 fL — ABNORMAL HIGH (ref 7.5–12.5)
Monocytes Relative: 5.1 %
Neutro Abs: 3138 cells/uL (ref 1500–7800)
Neutrophils Relative %: 59.2 %
Platelets: 132 10*3/uL — ABNORMAL LOW (ref 140–400)
RBC: 4.77 10*6/uL (ref 3.80–5.10)
RDW: 12.6 % (ref 11.0–15.0)
Total Lymphocyte: 32.1 %
WBC: 5.3 10*3/uL (ref 3.8–10.8)

## 2021-11-11 LAB — VITAMIN B12: Vitamin B-12: 632 pg/mL (ref 200–1100)

## 2021-11-11 LAB — LIPID PANEL W/REFLEX DIRECT LDL
Cholesterol: 282 mg/dL — ABNORMAL HIGH (ref <200)
HDL: 42 mg/dL — ABNORMAL LOW (ref >=50)
LDL Cholesterol (Calc): 201 mg/dL (calc) — ABNORMAL HIGH
Non-HDL Cholesterol (Calc): 240 mg/dL (calc) — ABNORMAL HIGH (ref <130)
Total CHOL/HDL Ratio: 6.7 (calc) — ABNORMAL HIGH (ref <5.0)
Triglycerides: 208 mg/dL — ABNORMAL HIGH (ref <150)

## 2021-11-11 LAB — ANTI-MICROSOMAL ANTIBODY LIVER / KIDNEY: LKM1 Ab: <=20 U (ref <=20.0)

## 2021-11-11 LAB — VITAMIN D 25 HYDROXY (VIT D DEFICIENCY, FRACTURES): Vit D, 25-Hydroxy: 30 ng/mL (ref 30–100)

## 2021-11-11 LAB — MITOCHONDRIAL ANTIBODIES: Mitochondrial M2 Ab, IgG: <=20 U (ref <=20.0)

## 2021-11-25 NOTE — Therapy (Deleted)
OUTPATIENT PHYSICAL THERAPY LOWER EXTREMITY EVALUATION   Patient Name: Pamela Waller MRN: [id number] DOB:[birth year], 56 y.o., female Today's Date: 11/25/2021    Past Medical History:  Diagnosis Date   Allergy    Chronic sinusitis 12/23/2018   Degenerative disc disease, cervical 11/07/2021   MRI proven 3 bulging disks with nerve pinches most notably on the left side    Depression    Endometriosis    Fatty liver 11/07/2021   GERD (gastroesophageal reflux disease)    Hyperlipidemia    Hypertension    Hypogammaglobulinemia ([place])    sub class 1 and 3 deficiency   Lumbar degenerative disc disease 11/07/2021   Palmar erythema 11/07/2021   Panic attacks    Sinusitis    Vitamin B12 deficiency 12/23/2018   Vitamin D deficiency 12/23/2018   Past Surgical History:  Procedure Laterality Date   ABDOMINAL HYSTERECTOMY     BLADDER SURGERY     BREAST LUMPECTOMY     CESAREAN SECTION     KNEE SURGERY     NASAL SEPTUM SURGERY     SINUSOTOMY     Patient Active Problem List   Diagnosis Date Noted   Palmar erythema 11/07/2021   Fatty liver 11/07/2021   Degenerative disc disease, cervical 11/07/2021   Lumbar degenerative disc disease 11/07/2021   Dry cough 07/17/2021   Family history of autoimmune disorder 07/17/2021   Dry mouth and eyes 07/17/2021   Memory changes 07/14/2020   Abnormal mammogram 05/17/2020   Bereavement, uncomplicated 75/17/0017   Fibroadenosis of breast 05/17/2020   Stress incontinence 05/17/2020   Tobacco use disorder 05/17/2020   Upper back pain on left side 12/27/2019   Hypersomnolence 12/25/2018   Chronic sinusitis 12/23/2018   Vitamin D deficiency 12/23/2018   Vitamin B12 deficiency 12/23/2018   Sialadenitis 02/09/2018   Generalized anxiety disorder with panic attacks 02/05/2018   Raised TSH level 10/09/2016   Thrombocytopenic disorder ([place]) 10/06/2016   Snoring 10/06/2016   Sinusitis 10/06/2016   Disorder of vision 09/23/2016   Neuropathy  09/23/2016   Insomnia 09/23/2016   Allergic rhinitis due to pollen 07/11/2016   Depressive disorder 07/11/2016   Gastritis 07/11/2016   Hyperlipidemia 07/11/2016   Benign essential hypertension 06/26/2015   Gastroesophageal reflux disease without esophagitis 02/08/2015   Idiopathic peripheral neuropathy 02/08/2015   Recurrent major depressive disorder, in partial remission ([place]) 02/08/2015   Myalgia 01/27/2013   Nicotine dependence 12/02/2012   BRCA2 gene mutation positive 03/11/2011   Fatigue 12/04/2009   Hypogammaglobulinemia ([place]) 12/04/2009    PCP: Loralee Pacas, MD  REFERRING PROVIDER: Loralee Pacas, MD  REFERRING DIAG:  M50.30 (ICD-10-CM) - Degenerative disc disease, cervical M51.36 (ICD-10-CM) - Lumbar degenerative disc disease  THERAPY DIAG:  No diagnosis found.  Rationale for Evaluation and Treatment: Rehabilitation  ONSET DATE: ***  SUBJECTIVE:   SUBJECTIVE STATEMENT: ***  PERTINENT HISTORY: *** PAIN:  Are you having pain? Yes: NPRS scale: ***/10 Pain location: *** Pain description: *** Aggravating factors: *** Relieving factors: ***  PRECAUTIONS: {Therapy precautions:24002}  WEIGHT BEARING RESTRICTIONS: {Yes ***/No:24003}  FALLS:  Has patient fallen in last 6 months? {fallsyesno:27318}  LIVING ENVIRONMENT: Lives with: {OPRC lives with:25569::"lives with their family"} Lives in: {Lives in:25570} Stairs: {opstairs:27293} Has following equipment at home: {Assistive devices:23999}  OCCUPATION: ***  PLOF: {PLOF:24004}  PATIENT GOALS: ***  NEXT MD VISIT:   OBJECTIVE:   DIAGNOSTIC FINDINGS:  08/22/20 MRI of c-spine IMPRESSION: No abnormal cord signal or enhancement.   Multilevel degenerative changes as detailed  above. No high-grade canal narrowing. Foraminal stenosis is greatest on the left at C5-C6. Disc herniation at T1-T2 may compress the exiting left T1 nerve root.  PATIENT SURVEYS:  {rehab  surveys:24030}  COGNITION: Overall cognitive status: {cognition:24006}     SENSATION: {sensation:27233}  EDEMA:  {edema:24020}  MUSCLE LENGTH: Hamstrings: Right *** deg; Left *** deg Marcello Moores test: Right *** deg; Left *** deg  POSTURE: {posture:25561}  PALPATION: ***   *** A/PROM:    11/25/2021      Flexion       Extension       R ROT       L ROT       R SB      L SB      * Pain   (Blank rows = not tested)   LE Measurements Lower Extremity Right 11/25/2021 Left 11/25/2021   A/PROM MMT A/PROM MMT  Hip Flexion      Hip Extension      Hip Abduction      Hip Adduction      Hip Internal rotation      Hip External rotation      Knee Flexion      Knee Extension      Ankle Dorsiflexion      Ankle Plantarflexion      Ankle Inversion      Ankle Eversion       (Blank rows = not tested) * pain   LOWER EXTREMITY SPECIAL TESTS:  {LEspecialtests:26242}  FUNCTIONAL TESTS:  {Functional tests:24029}  GAIT: Distance walked: *** Assistive device utilized: {Assistive devices:23999} Level of assistance: {Levels of assistance:24026} Comments: ***   TODAY'S TREATMENT:                                                                                                                              DATE: 11/25/2021   Therapeutic Exercise:  Aerobic: Supine: Prone:  Seated:  Standing: Neuromuscular Re-education: Manual Therapy: Therapeutic Activity: Self Care: Trigger Point Dry Needling:  Modalities:    PATIENT EDUCATION:  Education details: on current presentation, on HEP, on clinical outcomes score and POC Person educated: Patient Education method: Explanation, Demonstration, and Handouts Education comprehension: verbalized understanding  HOME EXERCISE PROGRAM: ***  ASSESSMENT:  CLINICAL IMPRESSION: Patient is a *** y.o. *** who was seen today for physical therapy evaluation and treatment for ***.   OBJECTIVE IMPAIRMENTS: {opptimpairments:25111}.   ACTIVITY  LIMITATIONS: {activitylimitations:27494}  PARTICIPATION LIMITATIONS: {participationrestrictions:25113}  PERSONAL FACTORS: {Personal factors:25162} are also affecting patient's functional outcome.   REHAB POTENTIAL: {rehabpotential:25112}  CLINICAL DECISION MAKING: {clinical decision making:25114}  EVALUATION COMPLEXITY: {Evaluation complexity:25115}  GOALS: Goals reviewed with patient? yes  SHORT TERM GOALS: Target date: {follow up:25551}  Patient will be independent in self management strategies to improve quality of life and functional outcomes. Baseline: New Program Goal status: INITIAL  2.  Patient will report at least 50% improvement in overall symptoms and/or function to demonstrate improved functional mobility  Baseline: 0% better Goal status: INITIAL  3.  *** Baseline:  Goal status: INITIAL  4.  *** Baseline:  Goal status: INITIAL    LONG TERM GOALS: Target date: {follow up:25551}   Patient will report at least 75% improvement in overall symptoms and/or function to demonstrate improved functional mobility Baseline: 0% better Goal status: INITIAL  2.  *** Baseline:  Goal status: INITIAL  3.  *** Baseline:  Goal status: INITIAL  4.  *** Baseline:  Goal status: INITIAL  PLAN:  PT FREQUENCY: {rehab frequency:25116}  PT DURATION: {rehab duration:25117}  PLANNED INTERVENTIONS: Therapeutic exercises, Therapeutic activity, Neuromuscular re-education, Balance training, Gait training, Patient/Family education, Self Care, Joint mobilization, Vestibular training, Aquatic Therapy, Dry Needling, Electrical stimulation, Spinal manipulation, Spinal mobilization, Cryotherapy, Moist heat, Ultrasound, Ionotophoresis 30m/ml Dexamethasone, Manual therapy, and Re-evaluation  PLAN FOR NEXT SESSION: ***   3:49 PM, 11/25/21 MJerene Pitch DPT Physical Therapy with [facility]

## 2021-11-26 ENCOUNTER — Ambulatory Visit: Admitting: Physical Therapy

## 2021-12-02 ENCOUNTER — Encounter: Payer: Self-pay | Admitting: Internal Medicine

## 2021-12-02 ENCOUNTER — Encounter (HOSPITAL_BASED_OUTPATIENT_CLINIC_OR_DEPARTMENT_OTHER): Payer: Self-pay

## 2021-12-02 ENCOUNTER — Ambulatory Visit (INDEPENDENT_AMBULATORY_CARE_PROVIDER_SITE_OTHER): Admitting: Internal Medicine

## 2021-12-02 ENCOUNTER — Emergency Department (HOSPITAL_BASED_OUTPATIENT_CLINIC_OR_DEPARTMENT_OTHER)
Admission: EM | Admit: 2021-12-02 | Discharge: 2021-12-02 | Disposition: A | Discharge status: Home / Self Care | Attending: Emergency Medicine | Admitting: Emergency Medicine

## 2021-12-02 ENCOUNTER — Other Ambulatory Visit: Payer: Self-pay

## 2021-12-02 ENCOUNTER — Emergency Department (HOSPITAL_BASED_OUTPATIENT_CLINIC_OR_DEPARTMENT_OTHER): Admitting: Radiology

## 2021-12-02 VITALS — BP 136/82 | HR 59 | Temp 98.5°F | Resp 14 | Ht 59.0 in | Wt 158.4 lb

## 2021-12-02 DIAGNOSIS — R091 Pleurisy: Secondary | ICD-10-CM | POA: Diagnosis not present

## 2021-12-02 DIAGNOSIS — R0781 Pleurodynia: Secondary | ICD-10-CM | POA: Diagnosis not present

## 2021-12-02 DIAGNOSIS — R0602 Shortness of breath: Secondary | ICD-10-CM | POA: Diagnosis present

## 2021-12-02 LAB — BASIC METABOLIC PANEL
Anion gap: 6 (ref 5–15)
BUN: 10 mg/dL (ref 6–20)
CO2: 28 mmol/L (ref 22–32)
Calcium: 10.1 mg/dL (ref 8.9–10.3)
Chloride: 106 mmol/L (ref 98–111)
Creatinine, Ser: 0.77 mg/dL (ref 0.44–1.00)
GFR, Estimated: >60 mL/min (ref >60)
Glucose, Bld: 92 mg/dL (ref 70–99)
Potassium: 4.3 mmol/L (ref 3.5–5.1)
Sodium: 140 mmol/L (ref 135–145)

## 2021-12-02 LAB — CBC
HCT: 44.7 % (ref 36.0–46.0)
Hemoglobin: 14.7 g/dL (ref 12.0–15.0)
MCH: 29.8 pg (ref 26.0–34.0)
MCHC: 32.9 g/dL (ref 30.0–36.0)
MCV: 90.7 fL (ref 80.0–100.0)
Platelets: 114 10*3/uL — ABNORMAL LOW (ref 150–400)
RBC: 4.93 MIL/uL (ref 3.87–5.11)
RDW: 13 % (ref 11.5–15.5)
WBC: 6.5 10*3/uL (ref 4.0–10.5)
nRBC: 0 % (ref 0.0–0.2)

## 2021-12-02 LAB — PREGNANCY, URINE: Preg Test, Ur: NEGATIVE

## 2021-12-02 LAB — D-DIMER, QUANTITATIVE: D-Dimer, Quant: 0.33 ug/mL-FEU (ref 0.00–0.50)

## 2021-12-02 LAB — TROPONIN I (HIGH SENSITIVITY)
Troponin I (High Sensitivity): 3 ng/L (ref <18)
Troponin I (High Sensitivity): 2 ng/L (ref <18)

## 2021-12-02 MED ORDER — KETOROLAC TROMETHAMINE 60 MG/2ML IM SOLN
60.0000 mg | Freq: Once | INTRAMUSCULAR | Status: AC
  Administered 2021-12-02: 60 mg via INTRAMUSCULAR

## 2021-12-02 MED ORDER — KETOROLAC TROMETHAMINE 10 MG PO TABS: 10.0000 mg | ORAL_TABLET | Freq: Four times a day (QID) | ORAL | 0 refills | Status: DC | PRN

## 2021-12-02 NOTE — Discharge Instructions (Addendum)
You are being prescribed Toradol/Ketoralac.  Do not take other NSAIDs such as ibuprofen, Advil, Aleve, etc. while you are on this.  You may still take Tylenol, up to 4000 mg/day.  If you develop high fever, coughing up blood, new or worsening pain, trouble breathing, or any other new/concerning symptoms then return to the ER for evaluation.

## 2021-12-02 NOTE — ED Triage Notes (Signed)
Pt c/o L scapular pain and SOB since Saturday. Pt states she is on a medication that causes blood clots and was referred by her doctor. Denies any CP.

## 2021-12-02 NOTE — ED Provider Notes (Addendum)
[facility] EMERGENCY DEPT Provider Note   CSN: [id number] Arrival date & time: 12/02/21  1153     History  Chief Complaint  Patient presents with   Shortness of Breath    Pamela Waller is a 56 y.o. female.  HPI 56 year old female presents with left sided back pain. She is concerned about blood clot.  She states this started 2 days ago all of a sudden and is sharp.  It worsens with deep inspiration but is also there all the time.  She reports it does not seem to get worse much with movement.  She has a chronic cough that is unchanged.  She denies any significant dyspnea or chest pain.  She has chronic calf pain from statins but no new calf pain or leg swelling.  She has never had a blood clot before.  She does have an autoimmune deficiency and is on Xembify, and due to this she and her doctor are worried about blood clots.  She has been taking ibuprofen and Tylenol with no relief.  She was given IM Toradol in her doctor's office today and sent here.  She states it did not help much.  Home Medications Prior to Admission medications   Medication Sig Start Date End Date Taking? Authorizing Provider  ketorolac (TORADOL) 10 MG tablet Take 1 tablet (10 mg total) by mouth every 6 (six) hours as needed. 12/02/21  Yes Sherwood Gambler, MD  cholecalciferol (VITAMIN D3) 25 MCG (1000 UT) tablet Take 5,000 Units by mouth daily. 2000 units daily    [provider]  clonazePAM (KLONOPIN) 0.5 MG tablet Take 1 tablet by mouth 3 times a day as needed 11/29/20     diphenhydrAMINE (BENADRYL) 12.5 MG/5ML elixir Take by mouth every 14 (fourteen) days. Take    [provider]  EPINEPHrine 0.3 mg/0.3 mL IJ SOAJ injection Inject into the muscle. 10/03/19   [provider]  Immune Globulin, Human,-klhw (XEMBIFY) 10 GM/50ML SOLN Inject 20 g into the skin every 14 (fourteen) days. 09/28/19   Kozlow, Donnamarie Poag, MD  meclizine (ANTIVERT) 25 MG tablet Take 1 tablet (25 mg total) by  mouth 3 (three) times daily as needed for dizziness. 01/28/21   Volney American, PA-C  propranolol ER (INDERAL LA) 60 MG 24 hr capsule TAKE 1 CAPSULE BY MOUTH ONCE DAILY 11/07/21 11/07/22  Loralee Pacas, MD  rosuvastatin (CRESTOR) 20 MG tablet TAKE 1 TABLET BY MOUTH ONCE A DAY 11/07/21 11/07/22  Loralee Pacas, MD  sertraline (ZOLOFT) 100 MG tablet Take 2 tablets by mouth daily 11/29/20     vitamin B-12 (CYANOCOBALAMIN) 1000 MCG tablet Take 1 tablet (1,000 mcg total) by mouth daily. 07/12/20   Biagio Borg, MD      Allergies    Dilaudid [hydromorphone hcl], Morphine and related, Iodinated contrast media, Codeine, Other, Penicillins, Percocet [oxycodone-acetaminophen], Prednisone, and Prozac [fluoxetine hcl]    Review of Systems   Review of Systems  Respiratory:  Negative for shortness of breath.   Cardiovascular:  Negative for chest pain and leg swelling.  Musculoskeletal:  Positive for back pain.    Physical Exam Updated Vital Signs BP (!) 146/84   Pulse (!) 48   Temp 98.6 F (37 C) (Oral)   Resp 18   SpO2 95%  Physical Exam Vitals and nursing note reviewed.  Constitutional:      Appearance: She is well-developed.  HENT:     Head: Normocephalic and atraumatic.  Cardiovascular:  Rate and Rhythm: Normal rate and regular rhythm.     Pulses:          Radial pulses are 2+ on the left side.     Heart sounds: Normal heart sounds.  Pulmonary:     Effort: Pulmonary effort is normal.     Breath sounds: Normal breath sounds.  Abdominal:     Palpations: Abdomen is soft.     Tenderness: There is no abdominal tenderness.  Musculoskeletal:     Cervical back: No tenderness.     Thoracic back: No tenderness.       Back:     Right lower leg: No tenderness. No edema.     Left lower leg: No tenderness. No edema.  Skin:    General: Skin is warm and dry.  Neurological:     Mental Status: She is alert.     ED Results / Procedures / Treatments   Labs (all labs ordered  are listed, but only abnormal results are displayed) Labs Reviewed  CBC - Abnormal; Notable for the following components:      Result Value   Platelets 114 (*)    All other components within normal limits  BASIC METABOLIC PANEL  PREGNANCY, URINE  D-DIMER, QUANTITATIVE  TROPONIN I (HIGH SENSITIVITY)  TROPONIN I (HIGH SENSITIVITY)    EKG EKG Interpretation  Date/Time:  Monday December 02 2021 12:00:54 EST Ventricular Rate:  51 PR Interval:  138 QRS Duration: 76 QT Interval:  426 QTC Calculation: 392 R Axis:   6 Text Interpretation: Sinus bradycardia Otherwise normal ECG No previous ECGs available no prior ECG for comparison. No STEMI Confirmed by Antony Blackbird [id number]) on 12/02/2021 1:46:42 PM  Radiology DG Chest 2 View  Result Date: 12/02/2021 CLINICAL DATA:  Shortness of breath and chest pain for 2 days. EXAM: CHEST - 2 VIEW COMPARISON:  Two-view chest x-ray 12/27/2019 FINDINGS: The heart size and mediastinal contours are within normal limits. Both lungs are clear. The visualized skeletal structures are unremarkable. IMPRESSION: No active cardiopulmonary disease. Electronically Signed   By: San Morelle M.D.   On: 12/02/2021 12:41    Procedures Procedures    Medications Ordered in ED Medications - No data to display  ED Course/ Medical Decision Making/ A&P                           Medical Decision Making Amount and/or Complexity of Data Reviewed External Data Reviewed: notes.    Details: PCP note from today Labs:     Details: Labs including troponins x2 and D-dimer and electrolytes are normal.  Normal WBC/hemoglobin. Radiology: independent interpretation performed.    Details: No pneumonia ECG/medicine tests: independent interpretation performed.    Details: Sinus bradycardia without acute ischemia  Risk Prescription drug management.   Patient is on Xembify which can increase the risk of blood clots but otherwise does not have any significant risk  factors.  Besides this, she does not seem to be a high risk presentation for PE with normal vitals besides some mild bradycardia including no hypoxia or increased work of breathing.  I think is more likely she has something like pleurisy.  We discussed options after her negative work-up.  We discussed that the D-dimer is negative and while this is not perfect this is reassuring in her presentation.  She is still concerned and so we discussed potentially a CTA.  She has an allergy listed to IV dye though she states  that was when she was 56 years old and suspects that the dye has changed and does not think she is truly allergic.  Thus we discussed options which include no CTA, CTA after premedication, or CTA without premedication, acknowledging the risk of potentially anaphylaxis.  After significant discussion as well as shared decision making, we decided to hold off on CT.  I do truly think that PE is unlikely and pleurisy is much more likely.  Otherwise, she is concerned because she does not want any narcotics but also the NSAIDs have not been helping.  She states typically whenever she has issues involving inflammation that she will get p.o. Toradol prescription for a few days.  I think this is reasonable and we will give her a 3-day prescription.  Otherwise, we discussed that it is possible she has a PE and she should come back if any new symptoms arise or any symptoms worsen.  Otherwise follow-up with PCP.        Final Clinical Impression(s) / ED Diagnoses Final diagnoses:  Pleurisy    Rx / DC Orders ED Discharge Orders          Ordered    ketorolac (TORADOL) 10 MG tablet  Every 6 hours PRN        12/02/21 1537              Sherwood Gambler, MD 12/02/21 1559    Sherwood Gambler, MD 12/02/21 1559

## 2021-12-02 NOTE — Assessment & Plan Note (Signed)
Due to the fact that when she lifts her left arm she has pain I suspect this is some sort of autoimmune inflammatory condition because she does not have any trauma.  However she has multiple risk factors for blood clot including current smoking family history of blood clots relatively sedentary lifestyle autoimmune disease and chronic pain in her calves making it difficult to rule out a blood clot in the calves as well.  Therefore my recommendation is to head to the ER to get a D-dimer and CAT scan as opposed to just doing a D-dimer and she reports that she will go that direction with it.  There is also some question of shortness of breath versus anxiety induced shortness of breath and with her history of smoking I think the CAT scan also also offers the opportunity to look for other causes of pleurisy besides blood clot such as lung cancer that would be best examined now.

## 2021-12-02 NOTE — Progress Notes (Signed)
f Therapist, music at [employer]:  [phone]   Routine Medical Office Visit  Patient:  Pamela Waller      Age: 56 y.o.       Sex:  female  Date:   12/02/2021  PCP:    Loralee Pacas, [place] Provider: Loralee Pacas, MD  Assessment/Plan:   Pasha was seen today for back pain, medication refill and numbness.  Pleuritic pain Overview: she reports that starting 2 to 3 days ago she developed severe left posterior thoracic pain that is knifelike on inspiration. She has risk of blood clots from autoimmune disease She cannot deny that she has chronic pain in her calves but also does not think there is any new pain or swelling She has never had a blood clot before and  and no recent surgery Father has family history of blood clots Ibuprofen and tylenol aren't touching the pain She is staying hydrated  Assessment & Plan: Due to the fact that when she lifts her left arm she has pain I suspect this is some sort of autoimmune inflammatory condition because she does not have any trauma.  However she has multiple risk factors for blood clot including current smoking family history of blood clots relatively sedentary lifestyle autoimmune disease and chronic pain in her calves making it difficult to rule out a blood clot in the calves as well.  Therefore my recommendation is to head to the ER to get a D-dimer and CAT scan as opposed to just doing a D-dimer and she reports that she will go that direction with it.  There is also some question of shortness of breath versus anxiety induced shortness of breath and with her history of smoking I think the CAT scan also also offers the opportunity to look for other causes of pleurisy besides blood clot such as lung cancer that would be best examined now.    Orders: -     Ketorolac Tromethamine      Subjective:   Pamela Waller is a 56 y.o. female with PMH significant for: Past Medical History:  Diagnosis Date    Allergy    Chronic sinusitis 12/23/2018   Degenerative disc disease, cervical 11/07/2021   MRI proven 3 bulging disks with nerve pinches most notably on the left side    Depression    Endometriosis    Fatty liver 11/07/2021   GERD (gastroesophageal reflux disease)    Hyperlipidemia    Hypertension    Hypogammaglobulinemia ([place])    sub class 1 and 3 deficiency   Lumbar degenerative disc disease 11/07/2021   Palmar erythema 11/07/2021   Panic attacks    Sinusitis    Vitamin B12 deficiency 12/23/2018   Vitamin D deficiency 12/23/2018     She is presenting today with: Chief Complaint  Patient presents with   Back Pain    Severe under left shoulder, worse upon inhalation since Saturday.   Medication Refill    [facility]   Numbness    Whole left side and massive tingling.          Objective:  Physical Exam: BP 136/82 (BP Location: Left Arm, Patient Position: Sitting)   Pulse (!) 59   Temp 98.5 F (36.9 C) (Temporal)   Resp 14   Ht _0  (1.499 m)   Wt 158 lb 6.4 oz (71.8 kg)   SpO2 96%   BMI 31.99 kg/m   She is a  polite, friendly, and genuine person Constitutional: NAD, AAO, not ill-appearing  Neuro: alert, no focal deficit obvious, articulate speech Psych: normal mood, behavior, thought content  Problem-specific physical exam findings:  Normal WOB She has rales in her left lower lobe.  Pictures taken at today's visit: No images are attached to the encounter or orders placed in the encounter.    Results Reviewed:  No results found for any visits on 12/02/21.   Recent Results (from the past 2160 hour(s))  IgG, IgA, IgM     Status: None   Collection Time: 10/29/21  2:22 PM  Result Value Ref Range   IgG (Immunoglobin G), Serum 865 586 - 1,602 mg/dL   IgA/Immunoglobulin A, Serum 119 87 - 352 mg/dL   IgM (Immunoglobulin M), Srm 87 26 - 217 mg/dL  IgE     Status: None   Collection Time: 10/29/21  2:22 PM  Result Value Ref Range    IgE (Immunoglobulin E), Serum 40 6 - 495 IU/mL  Lipid Panel w/reflex Direct LDL     Status: Abnormal   Collection Time: 11/07/21 11:30 AM  Result Value Ref Range   Cholesterol 282 (H) <200 mg/dL   HDL 42 (L) > OR = 50 mg/dL   Triglycerides 208 (H) <150 mg/dL    Comment: . If a non-fasting specimen was collected, consider repeat triglyceride testing on a fasting specimen if clinically indicated.  Yates Decamp et al. J. of Clin. Lipidol. 7106;2:694-854. Marland Kitchen    LDL Cholesterol (Calc) 201 (H) mg/dL (calc)    Comment: LDL-C levels > or = 190 mg/dL may indicate familial  hypercholesterolemia (FH). Clinical assessment and  measurement of blood lipid levels should be  considered for all first degree relatives of patients with an FH diagnosis. LDL Cholesterol (LDL-C) levels > or = 300 mg/dL may indicate homozygous familial hypercholesterolemia (HoFH). Untreated,  these extremely high LDL-C levels can result in premature CV events and mortality. Patients should be identified early and provided appropriate interventions to reduce the cumulative LDL-C burden from birth. . For questions about testing for familial hypercholesterolemia, please call Insurance risk surveyor at 1.866.GENE.INFO. Duncan Dull, et al. J National Lipid Association  Recommendations for Patient-Centered Management of Dyslipidemia: Part 1 Journal of  Clinical Lipidology 2015;9(2), 129-169. Cuchel, M. et al. (2014). Homozygous familial hypercholesterolaemia: new insights and guidance for clinicians to impro ve detection and clinical management. European Heart Journal, 35(32), [phone]. Reference range: <100 . Desirable range <100 mg/dL for primary prevention;   <70 mg/dL for patients with CHD or diabetic patients  with > or = 2 CHD risk factors. Marland Kitchen LDL-C is now calculated using the Martin-Hopkins  calculation, which is a validated novel method providing  better accuracy than the Friedewald equation in the   estimation of LDL-C.  Cresenciano Genre et al. Annamaria Helling. 9381;829(93): 2061-2068  (http://education.[facility].com/faq/FAQ164)    Total CHOL/HDL Ratio 6.7 (H) <5.0 (calc)   Non-HDL Cholesterol (Calc) 240 (H) <130 mg/dL (calc)    Comment: Non-HDL level > or = 220 is very high and may indicate  genetic familial hypercholesterolemia (FH). Clinical  assessment and measurement of blood lipid levels  should be considered for all first-degree relatives  of patients with an FH diagnosis. . For patients with diabetes plus 1 major ASCVD risk  factor, treating to a non-HDL-C goal of <100 mg/dL  (LDL-C of <70 mg/dL) is considered a therapeutic  option.   COMPLETE METABOLIC PANEL WITH GFR     Status: None   Collection  Time: 11/07/21 11:30 AM  Result Value Ref Range   Glucose, Bld 89 65 - 99 mg/dL    Comment: .            Fasting reference interval .    BUN 15 7 - 25 mg/dL   Creat 0.77 0.50 - 1.03 mg/dL   eGFR 90 > OR = 60 mL/min/1.63m   BUN/Creatinine Ratio SEE NOTE: 6 - 22 (calc)    Comment:    Not Reported: BUN and Creatinine are within    reference range. .    Sodium 139 135 - 146 mmol/L   Potassium 4.2 3.5 - 5.3 mmol/L   Chloride 106 98 - 110 mmol/L   CO2 26 20 - 32 mmol/L   Calcium 10.0 8.6 - 10.4 mg/dL   Total Protein 7.2 6.1 - 8.1 g/dL   Albumin 4.6 3.6 - 5.1 g/dL   Globulin 2.6 1.9 - 3.7 g/dL (calc)   AG Ratio 1.8 1.0 - 2.5 (calc)   Total Bilirubin 0.5 0.2 - 1.2 mg/dL   Alkaline phosphatase (APISO) 67 37 - 153 U/L   AST 14 10 - 35 U/L   ALT 8 6 - 29 U/L  CBC With Differential/Platelet     Status: Abnormal   Collection Time: 11/07/21 11:30 AM  Result Value Ref Range   WBC 5.3 3.8 - 10.8 Thousand/uL   RBC 4.77 3.80 - 5.10 Million/uL   Hemoglobin 14.7 11.7 - 15.5 g/dL   HCT 43.0 35.0 - 45.0 %   MCV 90.1 80.0 - 100.0 fL   MCH 30.8 27.0 - 33.0 pg   MCHC 34.2 32.0 - 36.0 g/dL   RDW 12.6 11.0 - 15.0 %   Platelets 132 (L) 140 - 400 Thousand/uL   MPV 12.8 (H) 7.5 - 12.5 fL    Neutro Abs 3,138 1,500 - 7,800 cells/uL   Lymphs Abs 1,701 850 - 3,900 cells/uL   Absolute Monocytes 270 200 - 950 cells/uL   Eosinophils Absolute 148 15 - 500 cells/uL   Basophils Absolute 42 0 - 200 cells/uL   Neutrophils Relative % 59.2 %   Total Lymphocyte 32.1 %   Monocytes Relative 5.1 %   Eosinophils Relative 2.8 %   Basophils Relative 0.8 %  NASH FibroSure(R) Plus     Status: Abnormal   Collection Time: 11/07/21 11:30 AM  Result Value Ref Range   Fibrosis Score 0.05 0.00 - 0.21   Fibrosis Stage Comment     Comment:                    F0 - No fibrosis   Steatosis Score 0.45 (H) 0.00 - 0.40   Steatosis Grade Comment     Comment:                 S1 - Mild Steatosis                      (But Clinically Significant) (5-33%)    NASH Score 0.21 0.00 - 0.25   NASH Grade Comment     Comment:                    N0 - No NASH   Methodology: Comment     Comment: The analytes tested are performed by FibroSure-Specific methods. Not intended for use with other diagnostic considerations.    ALPHA 2-MACROGLOBULINS, QN 140 110 - 276 mg/dL   Haptoglobin 140 33 - 346 mg/dL  Apolipoprotein A-1 131 116 - 209 mg/dL   Bilirubin, Total 0.3 0.0 - 1.2 mg/dL   GGT 9 0 - 60 IU/L   ALT (SGPT) P5P 11 0 - 40 IU/L   AST (SGOT) P5P 20 0 - 40 IU/L   Cholesterol, Total 284 (H) 100 - 199 mg/dL   Glucose 93 70 - 99 mg/dL   Triglycerides 215 (H) 0 - 149 mg/dL   Interpretations: Comment     Comment: Quantitative results of 10 biochemicals in combination with age and gender, are analyzed using a computational algorithm to provide a quantitative surrogate marker (0.0-1.0) of liver fibrosis (Metavir F0-F4), hepatic steatosis (0.0-1.0, S0-S3), and Non-Alcoholic Steato-Hepatitis (NASH) (0.0-1.0, N0-N3). The absence of steatosis (S<0.40) precludes the diagnosis of NASH. Fibrosis marker: In a study of 470 Non-Alcoholic Fatty Liver Disease (NAFLD) patients where 23% had significant NAFLD fibrosis  (Metavir F2-F4) and 11% had cirrhosis by liver biopsy, a fibrosis result of >0.3 yielded a sensitivity of 83% and a specificity of 78% for the detection of significant fibrosis.[1] Steatosis marker: In a population of 2997 patients, where 61% had significant steatosis (>=5%) on a liver biopsy, a steatosis score >0.4 had a sensitivity of 79% and a specificity of 50% for identification of significant steatosis.[2] NASH marker: In a population of 1081 NAFLD patients, where 51% had at  least some NASH by liver biopsy, a prediction of NASH had a sensitivity of 72% for identifying NASH and a specificity of 71%.[3]    Fibrosis Scoring: Comment     Comment:      <=0.21 = Stage F0 - No fibrosis 0.21 - 0.27 = Stage F0 - F1 0.27 - 0.31 = Stage F1 - Portal fibrosis 0.31 - 0.48 = Stage F1 - F2 0.48 - 0.58 = Stage F2 - Bridging fibrosis with few septa 0.58 - 0.72 = Stage F3 - Bridging fibrosis with many septa 0.72 - 0.74 = Stage F3 - F4       >0.74 = Stage F4 - Cirrhosis    Steatosis Scoring Comment     Comment:      <=0.40 = S0  - No Steatosis (<5%) 0.40 - 0.55 = S1  - Mild Steatosis                     (but Clinically Significant) (5-33%)       >0.55 = S2S3- Moderate to Severe Steatosis                     (Clinically Significant) (34-100%)    NASH Scoring Comment     Comment:      <=0.25 = N0 - No NASH 0.25 - 0.50 = N1 - Mild NASH 0.50 - 0.75 = N2 - Moderate NASH       >0.75 = N3 - Severe NASH    Limitations: Comment     Comment: NASH FibroSure(R) Plus is recommended for patients with suspected non-alcoholic fatty liver disease. It is not recommended for patients with other liver diseases. It is also not recommended in patients with [facility] Disease, acute hemolysis, acute viral hepatitis, drug induced hepatitis, genetic liver disease, autoimmune hepatitis and/or extra-hepatic cholestasis. Any of these clinical situations may lead to inaccurate quantitative predictions of fibrosis.     Comment: Comment     Comment: This test was developed and its performance characteristics determined by [facility]. It has not been cleared or approved by the Food and Drug Administration. For questions regarding this report  please contact customer service at [phone]. References: 1.  Ratziu V. et al. Diagnostic Value of Biochemical Markers (FibroTest) for the prediction of Liver Fibrosis in patients with Non-Alcoholic Fatty Liver Disease. [facility] Gastroenterology 2006; 6:6. 2.  Poynard T. et al. The Diagnostic Performance of a Simplified Blood Test (SteatoTest-2) for the Prediction of Liver Steatosis. Eur J Gastroenterol Hepatol. 2019; 63:845-364. 3.  Poynard T. et al. Diagnostic performance of a new noninvasive test for nonalcoholic steatohepatitis using a simplified histological reference. Eur J Gastroenterol Hepatol. 2018 May; 30:569-577.   Mitochondrial antibodies     Status: None   Collection Time: 11/07/21 11:30 AM  Result Value Ref Range   Mitochondrial M2 Ab, IgG <=20.0 <=20.0 U    Comment: . Reference Range:    Negative:  <=20.0      U    Equivocal: 20.1 - 24.9 U    Positive:  >=25.0      U .   AntiMicrosomal Ab-Liver / Kidney     Status: None   Collection Time: 11/07/21 11:30 AM  Result Value Ref Range   LKM1 Ab <=20.0 <=20.0 U    Comment: . Reference Range:   <=20.0      Negative   20.1-24.9   Equivocal   >=25.0      Positive . Anti-liver/kidney microsomal antibodies (Anti-LKM-1) were previously tested by indirect immunofluorescence (IF) using rodent liver/kidney substrate. Identification of a specific antibody target as cytochrome P450 IID6 has led to the current recombinant based ELISA. Antibodies to this cytochrome are present in approximately 70% of patients with autoimmune hepatitis type 2. This antibody is also present in approximately 10% of patients with hepatitis C infection. .   B12     Status: None   Collection Time: 11/07/21 11:30 AM   Result Value Ref Range   Vitamin B-12 632 200 - 1,100 pg/mL  VITAMIN D 25 Hydroxy (Vit-D Deficiency, Fractures)     Status: None   Collection Time: 11/07/21 11:30 AM  Result Value Ref Range   Vit D, 25-Hydroxy 30 30 - 100 ng/mL    Comment: Vitamin D Status         25-OH Vitamin D: . Deficiency:                    <20 ng/mL Insufficiency:             20 - 29 ng/mL Optimal:                 > or = 30 ng/mL . For 25-OH Vitamin D testing on patients on  D2-supplementation and patients for whom quantitation  of D2 and D3 fractions is required, the QuestAssureD(TM) 25-OH VIT D, (D2,D3), LC/MS/MS is recommended: order  code [id number] (patients >34yr). . See Note 1 . Note 1 . For additional information, please refer to  http://education.[facility].com/faq/FAQ199  (This link is being provided for informational/ educational purposes only.)          No images are attached to the encounter.

## 2021-12-02 NOTE — Patient Instructions (Addendum)
It was a pleasure seeing you today! I truly hope you feel like you received 5 star service and please let me know if there is anything I can improve.  Loralee Pacas, MD   Today the plan is...  Pleuritic pain Assessment & Plan: Due to the fact that when she lifts her left arm she has pain I suspect this is some sort of autoimmune inflammatory condition because she does not have any trauma.  However she has multiple risk factors for blood clot including current smoking family history of blood clots relatively sedentary lifestyle autoimmune disease and chronic pain in her calves making it difficult to rule out a blood clot in the calves as well.  Therefore my recommendation is to head to the ER to get a D-dimer and CAT scan as opposed to just doing a D-dimer and she reports that she will go that direction with it.  There is also some question of shortness of breath versus anxiety induced shortness of breath and with her history of smoking I think the CAT scan also also offers the opportunity to look for other causes of pleurisy besides blood clot such as lung cancer that would be best examined now.    Orders: -     Ketorolac Tromethamine         '[x]'$  RETURN TO CLINIC: Return if symptoms worsen or fail to improve.   - If you are not doing well: RETURN to the office sooner. - Please bring all your medicines to each appointment.  - If your condition begins to worsen or become severe:  GO to the ER.  '[x]'$  QUESTIONS/CONCERNS:  If you have follow-up questions / concerns:  - CLINICAL: please contact me via phone [phone] OR MyChart messaging  - [place] you will be contacted with the lab results as soon as they are available. For any labs or imaging tests, we will call you if the results are significantly abnormal.  Most normal results will be posted to myChart as soon as they are available and I will comment on them there within 2-3 business days.  The fastest way to get your  results is to activate your My Chart account. Instructions are located on the last page of this paperwork. If you have not heard from [place] regarding the results in 2 weeks, please contact this office.  - BILLING: xray and lab orders are billed from separate companies and questions./concerns should be directed to the [facility].  For visit charges please discuss with our administrative services

## 2022-02-03 ENCOUNTER — Other Ambulatory Visit: Payer: Self-pay | Admitting: Internal Medicine

## 2022-02-03 DIAGNOSIS — E785 Hyperlipidemia, unspecified: Secondary | ICD-10-CM

## 2022-02-04 ENCOUNTER — Other Ambulatory Visit (HOSPITAL_BASED_OUTPATIENT_CLINIC_OR_DEPARTMENT_OTHER): Payer: Self-pay

## 2022-02-04 DIAGNOSIS — Z1231 Encounter for screening mammogram for malignant neoplasm of breast: Secondary | ICD-10-CM

## 2022-02-06 ENCOUNTER — Other Ambulatory Visit (HOSPITAL_BASED_OUTPATIENT_CLINIC_OR_DEPARTMENT_OTHER): Payer: Self-pay | Admitting: Internal Medicine

## 2022-02-06 ENCOUNTER — Ambulatory Visit (HOSPITAL_BASED_OUTPATIENT_CLINIC_OR_DEPARTMENT_OTHER)
Admission: RE | Admit: 2022-02-06 | Discharge: 2022-02-06 | Disposition: A | Discharge status: Home / Self Care | Source: Ambulatory Visit | Attending: Internal Medicine | Admitting: Internal Medicine

## 2022-02-06 DIAGNOSIS — Z1231 Encounter for screening mammogram for malignant neoplasm of breast: Secondary | ICD-10-CM | POA: Diagnosis present

## 2022-02-07 ENCOUNTER — Encounter: Payer: Self-pay | Admitting: Internal Medicine

## 2022-02-07 ENCOUNTER — Ambulatory Visit: Admitting: Internal Medicine

## 2022-02-07 VITALS — BP 114/77 | HR 60 | Temp 98.1°F | Ht 59.0 in | Wt 156.0 lb

## 2022-02-07 DIAGNOSIS — G4733 Obstructive sleep apnea (adult) (pediatric): Secondary | ICD-10-CM

## 2022-02-07 DIAGNOSIS — R413 Other amnesia: Secondary | ICD-10-CM

## 2022-02-07 DIAGNOSIS — I10 Essential (primary) hypertension: Secondary | ICD-10-CM | POA: Diagnosis not present

## 2022-02-07 DIAGNOSIS — E538 Deficiency of other specified B group vitamins: Secondary | ICD-10-CM

## 2022-02-07 DIAGNOSIS — R001 Bradycardia, unspecified: Secondary | ICD-10-CM

## 2022-02-07 DIAGNOSIS — R42 Dizziness and giddiness: Secondary | ICD-10-CM

## 2022-02-07 DIAGNOSIS — D801 Nonfamilial hypogammaglobulinemia: Secondary | ICD-10-CM

## 2022-02-07 DIAGNOSIS — U099 Post covid-19 condition, unspecified: Secondary | ICD-10-CM | POA: Diagnosis not present

## 2022-02-07 DIAGNOSIS — E559 Vitamin D deficiency, unspecified: Secondary | ICD-10-CM

## 2022-02-07 DIAGNOSIS — D839 Common variable immunodeficiency, unspecified: Secondary | ICD-10-CM

## 2022-02-07 DIAGNOSIS — R5383 Other fatigue: Secondary | ICD-10-CM

## 2022-02-07 DIAGNOSIS — E785 Hyperlipidemia, unspecified: Secondary | ICD-10-CM

## 2022-02-07 MED ORDER — VITAMIN D 25 MCG (1000 UNIT) PO TABS: 5000.0000 [IU] | ORAL_TABLET | Freq: Every day | ORAL | 3 refills | Status: DC

## 2022-02-07 MED ORDER — ROSUVASTATIN CALCIUM 20 MG PO TABS: 20.0000 mg | ORAL_TABLET | Freq: Every day | ORAL | 0 refills | Status: DC

## 2022-02-07 MED ORDER — VITAMIN B-12 1000 MCG PO TABS: 1000.0000 ug | ORAL_TABLET | Freq: Every day | ORAL | 3 refills | Status: DC

## 2022-02-07 MED ORDER — PROPRANOLOL HCL ER 60 MG PO CP24: ORAL_CAPSULE | Freq: Every day | ORAL | 0 refills | Status: DC

## 2022-02-07 MED ORDER — MECLIZINE HCL 25 MG PO TABS: 25.0000 mg | ORAL_TABLET | Freq: Three times a day (TID) | ORAL | 0 refills | Status: DC | PRN

## 2022-02-07 MED ORDER — LOSARTAN POTASSIUM 25 MG PO TABS: 25.0000 mg | ORAL_TABLET | Freq: Every day | ORAL | 3 refills | Status: DC

## 2022-02-07 NOTE — Progress Notes (Signed)
[name]: [phone]   Routine Medical Office Visit  Patient:  Pamela Waller      Age: 57 y.o.       Sex:  female  Date:   02/07/2022  PCP:    Loralee Pacas, [place] Provider: Loralee Pacas, [place]   Problem Focused Charting:   Medical Decision Making per Assessment/Plan   Melora was seen today for 3 month follow-up.  OSA (obstructive sleep apnea) -     For home use only DME continuous positive airway pressure (CPAP)  Long COVID -     Ambulatory referral to Pulmonology  CVID (common variable immunodeficiency) ([place])  Benign essential hypertension Assessment & Plan: Individualized Hypertension Management: Stable, controlled at  BP Readings from Last 1 Encounters:  02/07/22 114/77   On hydrochloroTHIAZIDE 12.5, but not stable at home reaches symptomatic lows with it and how without.   Goal blood pressure of less than 140/90 explained She associates this with weight gain possibly fluid shifting Resistant/secondary hypertension workup: not necessary in my opinion Current hypertension medications:       Sig   propranolol ER (INDERAL LA) 60 MG 24 hr capsule (Taking) TAKE 1 CAPSULE BY MOUTH ONCE DAILY       Adjust medications as follows: Try switch taking the hydrochlorothiazide 12.5 mg daily to losartan 12.5 mg daily to try to stop having so much fluid shifting and see if that causes less blood pressure lability-also continue analyzing weight gain and salt intake and how it connects to the home blood pressure with these medicine changes  Standardized hypertension counseling and management: Counseled her to limit: salt, alcohol, NSAIDS, excess body weight. Have explained risks of poor control are FUTURE stroke and heart attacks Encouragement for home blood pressure monitoring Explained Red Flag symptoms for ER: if blood pressure over 180 AND new headache, shortness of breath, confusion, or chest discomfort See AFTER VISIT  SUMMARY for addition educational information provided Offered to refill meds.   Orders: -     Losartan Potassium; Take 1 tablet (25 mg total) by mouth daily. Take just half tablet daily for first month  Dispense: 90 tablet; Refill: 3 -     Propranolol HCl ER; TAKE 1 CAPSULE BY MOUTH ONCE DAILY  Dispense: 90 capsule; Refill: 0  Other fatigue Overview: Patients chart review and interview were used to generate a prompt for artificial intelligence analysis ([employer] artificial intelligence) clinical decision support.  AI output was reviewed and is provided in red:  Comprehensive Review of the Case: The patient is a 57 year old individual with a complex medical history including a mastectomy and oophorectomy for BRCA2 gene mutation, hypogammaglobulinemia requiring infusion therapy, and a chronic thrombocytopenic disorder with platelet counts consistently between 110-130 for at least 30 years. The patient's family history is notable for hypogammaglobulinemia and low platelets as well, affecting their father and sister. The patient's chief complaint is severe fatigue, which has been chronic. They also have a history of bradycardia, abnormal mammograms, allergic rhinitis, hypertension, chronic sinusitis, degenerative disc disease, depressive disorder, vision disorder, dry cough, dry mouth and eyes, anxiety with panic attacks, hyperlipidemia, hypersomnolence, idiopathic peripheral neuropathy, insomnia, memory changes, myalgia, neuropathy, nicotine dependence, obstructive sleep apnea, palmar erythema, pleuritic pain, raised TSH level, recurrent major depressive disorder, sialadenitis, snoring, stress incontinence, upper back pain, and vitamin deficiencies that have been corrected. The patient's complete blood count (CBC) and comprehensive metabolic panel (CMP) are normal except for the low platelet count. No other  lab abnormalities or imaging data are provided. The patient's medication list, social history,  physical examination findings, illness course, and any additional imaging or study findings are not mentioned in the summary.  #Chronic Severe Fatigue  The patient is a 57 year old with a complex medical history including a mastectomy and oophorectomy for BRCA2 gene mutation, hypogammaglobulinemia requiring infusion therapy, and a chronic thrombocytopenic disorder with platelet counts consistently between 110-130 for at least 30 years. The chief complaint is severe chronic fatigue. The patient also has a history of bradycardia, abnormal mammograms, allergic rhinitis, hypertension, chronic sinusitis, degenerative disc disease, depressive disorder, vision disorder, dry cough, dry mouth and eyes, anxiety with panic attacks, hyperlipidemia, hypersomnolence, idiopathic peripheral neuropathy, insomnia, memory changes, myalgia, neuropathy, nicotine dependence, obstructive sleep apnea, palmar erythema, pleuritic pain, raised TSH level, recurrent major depressive disorder, sialadenitis, snoring, stress incontinence, upper back pain, and vitamin deficiencies that have been corrected. The CBC and CMP are normal except for the low platelet count. Given the patient's history of hypogammaglobulinemia and chronic thrombocytopenia, along with a family history of similar issues, the most likely diagnosis is Common Variable Immune Deficiency (CVID). The differential diagnosis includes autoimmune lymphoproliferative syndrome (ALPS), systemic lupus erythematosus (SLE), myasthenia gravis (MG), chronic fatigue syndrome (CFS), hypothyroidism, Sjgren's syndrome, hematologic malignancy, paroxysmal nocturnal hemoglobinuria (PNH), antiphospholipid syndrome (APS), chronic infection, and medication side effect.  Dx - Immunoglobulin levels (IgG, IgA, IgM) - Autoimmune screening (ANA, RF, anti-CCP, anti-SSA/SSB) - Thyroid function tests (Free T4, Anti-TPO antibodies) - Flow cytometry for CD55/CD59 to rule out PNH - Antiphospholipid  antibody panel - Serologies for chronic infections (HIV, HBV, HCV) - Chest X-ray or CT scan to evaluate for lymphoproliferation - Bone marrow biopsy if hematologic malignancy is suspected  Tx - Immunoglobulin replacement therapy for hypogammaglobulinemia - Corticosteroids or other immunosuppressants for autoimmune conditions - Thyroid hormone replacement if hypothyroidism is confirmed - Symptomatic treatment for dry eyes and mouth if Sjgren's syndrome is diagnosed - Antidepressants (SSRI or SNRI) and cognitive-behavioral therapy for depressive disorder - CPAP for obstructive sleep apnea - Lifestyle modifications including smoking cessation, sleep hygiene, and stress management - Pain management strategies if myalgia is present (NSAIDs, heat/cold therapy) - Regular follow-up with hematology for monitoring of thrombocytopenia and any potential hematologic disorders  Most Likely Dx:  The most likely diagnosis for this patient is Common Variable Immune Deficiency (CVID). CVID is a primary immunodeficiency characterized by hypogammaglobulinemia, recurrent infections, and often autoimmune phenomena, which could explain the chronic thrombocytopenia and family history of similar issues. The patient's severe fatigue could be attributed to the chronic immune activation and recurrent infections associated with CVID. The presence of autoimmune thyroid disease (indicated by raised TSH) and other autoimmune conditions could further suggest this diagnosis.  Expanded DDx:  1. Autoimmune Lymphoproliferative Syndrome (ALPS): ALPS is a disorder of the immune system that can lead to chronic nonmalignant lymphoproliferation, autoimmune cytopenias (including thrombocytopenia), and hypogammaglobulinemia. The patient's long-standing thrombocytopenia, family history of similar hematologic abnormalities, and hypogammaglobulinemia could be explained by ALPS. The presence of characteristic lymphoproliferation on imaging  or biopsy could further suggest this diagnosis.  2. Systemic Lupus Erythematosus (SLE): SLE is a chronic autoimmune disease that can affect multiple organ systems and present with a wide range of clinical manifestations, including fatigue, cytopenias (such as thrombocytopenia), and potentially hypogammaglobulinemia. The patient's fatigue, family history of autoimmune disorders, and diverse array of symptoms could be manifestations of SLE. The presence of antinuclear antibodies (ANA) and other specific autoantibodies could further suggest this diagnosis.  Alternative DDx:  1. Myasthenia Gravis (MG): MG is an autoimmune neuromuscular disease that can cause fatigue and muscle weakness. The patient's fatigue, myalgia, and respiratory symptoms like dry cough could be due to MG. The presence of acetylcholine receptor antibodies or muscle-specific kinase antibodies could further suggest this diagnosis.  2. Chronic Fatigue Syndrome (CFS): CFS is a complex disorder characterized by extreme fatigue that cannot be explained by any underlying medical condition. The patient's severe fatigue, sleep disturbances, and myalgia could be consistent with CFS. The presence of a characteristic pattern of exacerbation and remission of symptoms could further suggest this diagnosis.  3. Hypothyroidism: The patient's raised TSH level indicates hypothyroidism, which can cause fatigue, bradycardia, myalgia, and cognitive changes. The presence of anti-thyroid antibodies could further suggest this diagnosis.  4. Sjgren's Syndrome: Sjgren's Syndrome is an autoimmune disorder characterized by dry eyes and mouth, which the patient has, along with fatigue and joint pain. The presence of anti-SSA/SSB antibodies could further suggest this diagnosis.  5. Hematologic Malignancy: Given the chronic thrombocytopenia and family history of hematologic abnormalities, a hematologic malignancy such as lymphoma or leukemia could be considered.  The presence of abnormal cells in the blood or bone marrow could further suggest this diagnosis.  6. Paroxysmal Nocturnal Hemoglobinuria (PNH): PNH is a rare, acquired, life-threatening disease of the blood characterized by destruction of red blood cells, blood clots, and impaired bone marrow function, which could explain the thrombocytopenia. The presence of glycosylphosphatidylinositol (GPI) anchor protein deficiencies on flow cytometry could further suggest this diagnosis.  7. Antiphospholipid Syndrome (APS): APS is an autoimmune disorder characterized by thrombosis and pregnancy-related complications, which can also cause thrombocytopenia and fatigue. The presence of antiphospholipid antibodies could further suggest this diagnosis.  8. Chronic Infection: Chronic infections such as HIV or hepatitis C can cause fatigue, thrombocytopenia, and hypogammaglobulinemia. The presence of positive serology for these infections could further suggest this diagnosis.  9. Medication Side Effect: Without a medication list, it is not possible to determine if the patient's symptoms could be related to adverse effects of medications. If the patient were taking medications known to cause bradycardia, thrombocytopenia, or hypogammaglobulinemia, this could further suggest a diagnosis related to medication side effects.  Assessment & Plan: I created an in depth artificial intelligence generated differential and plan as listed above and reviewed it.   I decided to treat this issue as sleep apnea and long COVID to start and if not good improvement, then we will discuss doing the 10000$ workup the artificial intelligence suggested, much of which has been already checked in the past   Bradycardia Overview: Possibly symptomatic with fatigue.  Assessment & Plan: We discussed and I documented and offered cardiology referral , holter but we shifted focus to her symptom(s) being more likely from obstructive sleep apnea so  this plan is back burner for now.... I don't think this is the cause of her fatigue or symptomatic but she will continue to monitor and let me know.     Hypogammaglobulinemia ([place]) Overview: Hypogammaglobulinemia  Assessment & Plan: She feels no better after infusions but is continuing to do it.   Hyperlipidemia LDL goal <100 Overview: Hyperlipidemia  Orders: -     Rosuvastatin Calcium; Take 1 tablet (20 mg total) by mouth daily.  Dispense: 90 tablet; Refill: 0  Vitamin D deficiency -     Vitamin D; Take 5 tablets (5,000 Units total) by mouth daily. 2000 units daily  Dispense: 90 tablet; Refill: 3  Vertigo -     Meclizine HCl;  Take 1 tablet (25 mg total) by mouth 3 (three) times daily as needed for dizziness.  Dispense: 15 tablet; Refill: 0  B12 deficiency Overview: Taking 2000 mcg/day she has chronic very low B12 she had intrinsic factor testing but that did not find pernicious anemia I suspect that she does not absorb it well but even with shots that she says she does not improve Lab Results  Component Value Date/Time   VITAMINB12 632 11/07/2021 11:30 AM   VITAMINB12 559 08/07/2020 10:30 AM   VITAMINB12 176 (L) 07/12/2020 01:54 PM   VITAMINB12 166 (L) 12/27/2019 10:03 AM   VITAMINB12 180 (L) 12/23/2018 03:52 PM     Assessment & Plan: Advised stay on B12 long term and filled due to history recurrent low B12.  Not sure if its a dietary or absorption problem but it seems to respond to tablet so suspect she has low B12 dietary intake.  Orders: -     Vitamin B-12; Take 1 tablet (1,000 mcg total) by mouth daily.  Dispense: 100 tablet; Refill: 3  Memory changes Overview: MRI brain was negative when evaluated by neurology  Assessment & Plan: Has been dismissed from neurology, but she feels worsening Encouraged call and make follow up appointment. Sleep study - 2015ish showed some desattings and snoring.but    Vitamin B12 deficiency Overview: Taking 2000 mcg/day she  has chronic very low B12 she had intrinsic factor testing but that did not find pernicious anemia I suspect that she does not absorb it well but even with shots that she says she does not improve Lab Results  Component Value Date/Time   VITAMINB12 632 11/07/2021 11:30 AM   VITAMINB12 559 08/07/2020 10:30 AM   VITAMINB12 176 (L) 07/12/2020 01:54 PM   VITAMINB12 166 (L) 12/27/2019 10:03 AM   VITAMINB12 180 (L) 12/23/2018 03:52 PM     Assessment & Plan: Advised stay on B12 long term and filled due to history recurrent low B12.  Not sure if its a dietary or absorption problem but it seems to respond to tablet so suspect she has low B12 dietary intake.       Subjective - Clinical Presentation:   Amorita Vanrossum is a 57 y.o. female  Patient Active Problem List   Diagnosis Date Noted   Bradycardia 02/07/2022   OSA (obstructive sleep apnea) 02/07/2022   Vertigo 02/07/2022   Long COVID 02/07/2022   Pleuritic pain 12/02/2021   Palmar erythema 11/07/2021   Fatty liver 11/07/2021   Degenerative disc disease, cervical 11/07/2021   Lumbar degenerative disc disease 11/07/2021   Dry cough 07/17/2021   Family history of autoimmune disorder 07/17/2021   Dry mouth and eyes 07/17/2021   Memory changes 07/14/2020   Abnormal mammogram 05/17/2020   Bereavement, uncomplicated 53/61/4431   Fibroadenosis of breast 05/17/2020   Stress incontinence 05/17/2020   Tobacco use disorder 05/17/2020   Upper back pain on left side 12/27/2019   Hypersomnolence 12/25/2018   Chronic sinusitis 12/23/2018   Vitamin D deficiency 12/23/2018   B12 deficiency 12/23/2018   Sialadenitis 02/09/2018   Generalized anxiety disorder with panic attacks 02/05/2018   Raised TSH level 10/09/2016   Thrombocytopenic disorder ([place]) 10/06/2016   Snoring 10/06/2016   Sinusitis 10/06/2016   Disorder of vision 09/23/2016   Neuropathy 09/23/2016   Insomnia 09/23/2016   Allergic rhinitis due to pollen 07/11/2016   Depressive  disorder 07/11/2016   Gastritis 07/11/2016   Hyperlipidemia 07/11/2016   Benign essential hypertension 06/26/2015   Gastroesophageal reflux  disease without esophagitis 02/08/2015   Idiopathic peripheral neuropathy 02/08/2015   Recurrent major depressive disorder, in partial remission ([place]) 02/08/2015   Myalgia 01/27/2013   Nicotine dependence 12/02/2012   BRCA2 gene mutation positive 03/11/2011   Fatigue 12/04/2009   Hyperlipidemia LDL goal <100 12/04/2009   Hypogammaglobulinemia ([place]) 12/04/2009   Past Medical History:  Diagnosis Date   Allergy    Chronic sinusitis 12/23/2018   Degenerative disc disease, cervical 11/07/2021   MRI proven 3 bulging disks with nerve pinches most notably on the left side    Depression    Endometriosis    Fatty liver 11/07/2021   GERD (gastroesophageal reflux disease)    Hyperlipidemia    Hypertension    Hypogammaglobulinemia ([facility])    sub class 1 and 3 deficiency   Lumbar degenerative disc disease 11/07/2021   Palmar erythema 11/07/2021   Panic attacks    Sinusitis    Vitamin B12 deficiency 12/23/2018   Vitamin D deficiency 12/23/2018    Outpatient Medications Prior to Visit  Medication Sig   clonazePAM (KLONOPIN) 0.5 MG tablet Take 1 tablet by mouth 3 times a day as needed   diphenhydrAMINE (BENADRYL) 12.5 MG/5ML elixir Take by mouth every 14 (fourteen) days. Take   EPINEPHrine 0.3 mg/0.3 mL IJ SOAJ injection Inject into the muscle.   Immune Globulin, Human,-klhw (XEMBIFY) 10 GM/50ML SOLN Inject 20 g into the skin every 14 (fourteen) days.   ketorolac (TORADOL) 10 MG tablet Take 1 tablet (10 mg total) by mouth every 6 (six) hours as needed.   sertraline (ZOLOFT) 100 MG tablet Take 2 tablets by mouth daily   [DISCONTINUED] cholecalciferol (VITAMIN D3) 25 MCG (1000 UT) tablet Take 5,000 Units by mouth daily. 2000 units daily   [DISCONTINUED] meclizine (ANTIVERT) 25 MG tablet Take 1 tablet (25 mg total) by mouth 3 (three) times daily as needed  for dizziness.   [DISCONTINUED] propranolol ER (INDERAL LA) 60 MG 24 hr capsule TAKE 1 CAPSULE BY MOUTH ONCE DAILY   [DISCONTINUED] rosuvastatin (CRESTOR) 20 MG tablet TAKE 1 TABLET BY MOUTH EVERY DAY   [DISCONTINUED] vitamin B-12 (CYANOCOBALAMIN) 1000 MCG tablet Take 1 tablet (1,000 mcg total) by mouth daily.   No facility-administered medications prior to visit.    Chief Complaint  Patient presents with   3 month follow-up    HPI  Patient reporting continued fatigue, sinus congestion, memory loss as main complaints.   Has history obstructive sleep apnea with 17 min desat but has not been on CPAP machine ever due to a negative follow up sleep study.  Reports severe snoring.   Would like referred for long COVID.  Feels so fatigued, many years, never imprvoing.   Has been taking B12 and B12 normalized on last labs, which didn't show any cause of the fatigue.        Objective:  Physical Exam  BP 114/77 (BP Location: Left Arm, Patient Position: Sitting)   Pulse 60   Temp 98.1 F (36.7 C) (Temporal)   Ht '4\' 11"'$  (1.499 m)   Wt 156 lb (70.8 kg)   SpO2 96%   BMI 31.51 kg/m  Obese  by BMI criteria but truncal adiposity (waist circumference or caliper) should be used instead. Wt Readings from Last 10 Encounters:  02/07/22 156 lb (70.8 kg)  12/02/21 158 lb 6.4 oz (71.8 kg)  11/07/21 159 lb 9.6 oz (72.4 kg)  10/29/21 157 lb 6.4 oz (71.4 kg)  07/17/21 158 lb 9.6 oz (71.9 kg)  02/19/21 160  lb (72.6 kg)  08/07/20 155 lb (70.3 kg)  07/30/20 155 lb (70.3 kg)  07/19/20 155 lb (70.3 kg)  07/12/20 155 lb 12.8 oz (70.7 kg)   Vital signs reviewed.  Nursing notes reviewed. Weight trend reviewed. General Appearance:  Well developed, well nourished female in no acute distress.   Normal work of breathing at rest Musculoskeletal: All extremities are intact.  Neurological:  Awake, alert,  No obvious focal neurological deficits or cognitive impairments Psychiatric:  Appropriate mood, pleasant  demeanor Problem-specific findings:  tired appearing, truncal adiposity   Results Reviewed: No results found for any visits on 02/07/22.  Recent Results (from the past 2160 hour(s))  Basic metabolic panel     Status: None   Collection Time: 12/02/21 12:08 PM  Result Value Ref Range   Sodium 140 135 - 145 mmol/L   Potassium 4.3 3.5 - 5.1 mmol/L   Chloride 106 98 - 111 mmol/L   CO2 28 22 - 32 mmol/L   Glucose, Bld 92 70 - 99 mg/dL    Comment: Glucose reference range applies only to samples taken after fasting for at least 8 hours.   BUN 10 6 - 20 mg/dL   Creatinine, Ser 0.77 0.44 - 1.00 mg/dL   Calcium 10.1 8.9 - 10.3 mg/dL   GFR, Estimated >60 >60 mL/min    Comment: (NOTE) Calculated using the CKD-EPI Creatinine Equation (2021)    Anion gap 6 5 - 15    Comment: Performed at [facility], [address] [place], [place] [address]  CBC     Status: Abnormal   Collection Time: 12/02/21 12:08 PM  Result Value Ref Range   WBC 6.5 4.0 - 10.5 K/uL   RBC 4.93 3.87 - 5.11 MIL/uL   Hemoglobin 14.7 12.0 - 15.0 g/dL   HCT 44.7 36.0 - 46.0 %   MCV 90.7 80.0 - 100.0 fL   MCH 29.8 26.0 - 34.0 pg   MCHC 32.9 30.0 - 36.0 g/dL   RDW 13.0 11.5 - 15.5 %   Platelets 114 (L) 150 - 400 K/uL   nRBC 0.0 0.0 - 0.2 %    Comment: Performed at [facility], [place], [place] [address]  Troponin I (High Sensitivity)     Status: None   Collection Time: 12/02/21 12:08 PM  Result Value Ref Range   Troponin I (High Sensitivity) 3 <18 ng/L    Comment: (NOTE) Elevated high sensitivity troponin I (hsTnI) values and significant  changes across serial measurements may suggest ACS but many other  chronic and acute conditions are known to elevate hsTnI results.  Refer to the "Links" section for chest pain algorithms and additional  guidance. Performed at [facility], [address], [place], [place] [address]   Pregnancy, urine      Status: None   Collection Time: 12/02/21 12:08 PM  Result Value Ref Range   Preg Test, Ur NEGATIVE NEGATIVE    Comment:        THE SENSITIVITY OF THIS METHODOLOGY IS >20 mIU/mL. Performed at [facility], [address] [place], [place] [address]   D-dimer, quantitative     Status: None   Collection Time: 12/02/21 12:08 PM  Result Value Ref Range   D-Dimer, Quant 0.33 0.00 - 0.50 ug/mL-FEU    Comment: (NOTE) At the manufacturer cut-off value of 0.5 g/mL FEU, this assay has a negative predictive value of 95-100%.This assay is intended for use in conjunction with a  clinical pretest probability (PTP) assessment model to exclude pulmonary embolism (PE) and deep venous thrombosis (DVT) in outpatients suspected of PE or DVT. Results should be correlated with clinical presentation. Performed at [facility], [address] [place], [place] [address]   Troponin I (High Sensitivity)     Status: None   Collection Time: 12/02/21  2:12 PM  Result Value Ref Range   Troponin I (High Sensitivity) 2 <18 ng/L    Comment: (NOTE) Elevated high sensitivity troponin I (hsTnI) values and significant  changes across serial measurements may suggest ACS but many other  chronic and acute conditions are known to elevate hsTnI results.  Refer to the "Links" section for chest pain algorithms and additional  guidance. Performed at [facility], [address] [place], [place] [address]           Signed: Loralee Pacas, MD 02/07/2022 1:16 PM

## 2022-02-07 NOTE — Assessment & Plan Note (Signed)
Advised stay on B12 long term and filled due to history recurrent low B12.  Not sure if its a dietary or absorption problem but it seems to respond to tablet so suspect she has low B12 dietary intake.

## 2022-02-07 NOTE — Assessment & Plan Note (Signed)
She feels no better after infusions but is continuing to do it.

## 2022-02-07 NOTE — Assessment & Plan Note (Signed)
Has been dismissed from neurology, but she feels worsening Encouraged call and make follow up appointment. Sleep study - 2015ish showed some desattings and snoring.but

## 2022-02-07 NOTE — Assessment & Plan Note (Signed)
We discussed and I documented and offered cardiology referral , holter but we shifted focus to her symptom(s) being more likely from obstructive sleep apnea so this plan is back burner for now.... I don't think this is the cause of her fatigue or symptomatic but she will continue to monitor and let me know.

## 2022-02-07 NOTE — Assessment & Plan Note (Signed)
I created an in depth artificial intelligence generated differential and plan as listed above and reviewed it.   I decided to treat this issue as sleep apnea and long COVID to start and if not good improvement, then we will discuss doing the 10000$ workup the artificial intelligence suggested, much of which has been already checked in the past

## 2022-02-07 NOTE — Assessment & Plan Note (Signed)
Individualized Hypertension Management: Stable, controlled at  BP Readings from Last 1 Encounters:  02/07/22 114/77   On hydrochloroTHIAZIDE 12.5, but not stable at home reaches symptomatic lows with it and how without.   Goal blood pressure of less than 140/90 explained She associates this with weight gain possibly fluid shifting Resistant/secondary hypertension workup: not necessary in my opinion Current hypertension medications:       Sig   propranolol ER (INDERAL LA) 60 MG 24 hr capsule (Taking) TAKE 1 CAPSULE BY MOUTH ONCE DAILY       Adjust medications as follows: Try switch taking the hydrochlorothiazide 12.5 mg daily to losartan 12.5 mg daily to try to stop having so much fluid shifting and see if that causes less blood pressure lability-also continue analyzing weight gain and salt intake and how it connects to the home blood pressure with these medicine changes  Standardized hypertension counseling and management: Counseled her to limit: salt, alcohol, NSAIDS, excess body weight. Have explained risks of poor control are FUTURE stroke and heart attacks Encouragement for home blood pressure monitoring Explained Red Flag symptoms for ER: if blood pressure over 180 AND new headache, shortness of breath, confusion, or chest discomfort See AFTER VISIT SUMMARY for addition educational information provided Offered to refill meds.

## 2022-02-11 ENCOUNTER — Other Ambulatory Visit: Payer: Self-pay

## 2022-02-11 DIAGNOSIS — E559 Vitamin D deficiency, unspecified: Secondary | ICD-10-CM

## 2022-02-11 MED ORDER — VITAMIN D 25 MCG (1000 UNIT) PO TABS: 1000.0000 [IU] | ORAL_TABLET | Freq: Every day | ORAL | 3 refills | Status: DC

## 2022-02-18 NOTE — Progress Notes (Unsigned)
Office Visit Note  Patient: Pamela Waller             Date of Birth: 1965-04-15           MRN: [id number]             PCP: Loralee Pacas, MD Referring: Jiles Prows, MD Visit Date: 02/19/2022 Occupation: '@GUAROCC'$ @  Subjective:  No chief complaint on file.   History of Present Illness: Pamela Waller is a 57 y.o. female here for evaluation of suspected sjogren syndrome with chronically dry eyes and mouth and polyarthralgia and episodes of suspected pleurisy. She has CVID on subcutaneous immunoglobulin supplementation. She had 2 COVID infections last year particularly worse with the most recent illness. She had a previous rheumatology evaluation in 2013 findings at that time suspected to be primarily from fibromyalgia syndrome without a specific inflammatory process identified. There was some question for PsA or other seronegative inflammatory process but never on immunomodulatory treatment. ***   Labs reviewed 07/2020 ANA neg ESR 10  2015 - RF neg 2013 - CCP neg   Activities of Daily Living:  Patient reports morning stiffness for *** {minute/hour:19697}.   Patient {ACTIONS;DENIES/REPORTS:21021675::"Denies"} nocturnal pain.  Difficulty dressing/grooming: {ACTIONS;DENIES/REPORTS:21021675::"Denies"} Difficulty climbing stairs: {ACTIONS;DENIES/REPORTS:21021675::"Denies"} Difficulty getting out of chair: {ACTIONS;DENIES/REPORTS:21021675::"Denies"} Difficulty using hands for taps, buttons, cutlery, and/or writing: {ACTIONS;DENIES/REPORTS:21021675::"Denies"}  No Rheumatology ROS completed.   PMFS History:  Patient Active Problem List   Diagnosis Date Noted   Bradycardia 02/07/2022   OSA (obstructive sleep apnea) 02/07/2022   Vertigo 02/07/2022   Long COVID 02/07/2022   Pleuritic pain 12/02/2021   Palmar erythema 11/07/2021   Fatty liver 11/07/2021   Degenerative disc disease, cervical 11/07/2021   Lumbar degenerative disc disease 11/07/2021   Dry cough 07/17/2021    Family history of autoimmune disorder 07/17/2021   Dry mouth and eyes 07/17/2021   Memory changes 07/14/2020   Abnormal mammogram 05/17/2020   Bereavement, uncomplicated 89/21/1941   Fibroadenosis of breast 05/17/2020   Stress incontinence 05/17/2020   Tobacco use disorder 05/17/2020   Upper back pain on left side 12/27/2019   Hypersomnolence 12/25/2018   Chronic sinusitis 12/23/2018   Vitamin D deficiency 12/23/2018   B12 deficiency 12/23/2018   Sialadenitis 02/09/2018   Generalized anxiety disorder with panic attacks 02/05/2018   Raised TSH level 10/09/2016   Thrombocytopenic disorder ([place]) 10/06/2016   Snoring 10/06/2016   Sinusitis 10/06/2016   Disorder of vision 09/23/2016   Neuropathy 09/23/2016   Insomnia 09/23/2016   Allergic rhinitis due to pollen 07/11/2016   Depressive disorder 07/11/2016   Gastritis 07/11/2016   Hyperlipidemia 07/11/2016   Benign essential hypertension 06/26/2015   Gastroesophageal reflux disease without esophagitis 02/08/2015   Idiopathic peripheral neuropathy 02/08/2015   Recurrent major depressive disorder, in partial remission ([place]) 02/08/2015   Myalgia 01/27/2013   Nicotine dependence 12/02/2012   BRCA2 gene mutation positive 03/11/2011   Fatigue 12/04/2009   Hyperlipidemia LDL goal <100 12/04/2009   Hypogammaglobulinemia ([place]) 12/04/2009    Past Medical History:  Diagnosis Date   Allergy    Chronic sinusitis 12/23/2018   Degenerative disc disease, cervical 11/07/2021   MRI proven 3 bulging disks with nerve pinches most notably on the left side    Depression    Endometriosis    Fatty liver 11/07/2021   GERD (gastroesophageal reflux disease)    Hyperlipidemia    Hypertension    Hypogammaglobulinemia ([facility])    sub class 1 and 3 deficiency   Lumbar degenerative disc  disease 11/07/2021   Palmar erythema 11/07/2021   Panic attacks    Sinusitis    Vitamin B12 deficiency 12/23/2018   Vitamin D deficiency 12/23/2018    Family History   Problem Relation Age of Onset   Asthma Mother    Arthritis Mother    Breast cancer Mother    Pulmonary fibrosis Father    Hyperlipidemia Father    Hypertension Father    Breast cancer Sister    Stomach cancer Neg Hx    Colon cancer Neg Hx    Pancreatic cancer Neg Hx    Liver disease Neg Hx    Esophageal cancer Neg Hx    Past Surgical History:  Procedure Laterality Date   ABDOMINAL HYSTERECTOMY     BLADDER SURGERY     BREAST LUMPECTOMY     CESAREAN SECTION     KNEE SURGERY     NASAL SEPTUM SURGERY     SINUSOTOMY     Social History   Social History Narrative   Not on file   Immunization History  Administered Date(s) Administered   Hepatitis B 05/15/2008, 06/14/2008   Hepatitis B, PED/ADOLESCENT 05/15/2008, 06/14/2008   Influenza Inj Mdck Quad Pf 12/31/2016   Influenza, Seasonal, Injecte, Preservative Fre 10/21/2014, 11/01/2015, 10/30/2017   Influenza,inj,Quad PF,6+ Mos 12/31/2016, 11/13/2020   Influenza,trivalent, recombinat, inj, PF 10/08/2007, 10/16/2008, 10/25/2009, 12/02/2012   Influenza-Unspecified 10/21/2014, 11/01/2015, 10/30/2017, 10/24/2019, 11/14/2021   PFIZER(Purple Top)SARS-COV-2 Vaccination 03/11/2019, 04/01/2019   PPD Test 08/18/2007, 06/20/2008, 09/04/2009   Pneumococcal Polysaccharide-23 12/08/2007, 10/10/2011, 03/05/2017   Tdap 08/18/2007, 10/10/2011     Objective: Vital Signs: There were no vitals taken for this visit.   Physical Exam   Musculoskeletal Exam: ***  CDAI Exam: CDAI Score: -- Patient Global: --; Provider Global: -- Swollen: --; Tender: -- Joint Exam 02/19/2022   No joint exam has been documented for this visit   There is currently no information documented on the homunculus. Go to the Rheumatology activity and complete the homunculus joint exam.  Investigation: No additional findings.  Imaging: MM 3D SCREEN BREAST BILATERAL  Result Date: 02/07/2022 CLINICAL DATA:  Screening. EXAM: DIGITAL SCREENING BILATERAL MAMMOGRAM  WITH TOMOSYNTHESIS AND CAD TECHNIQUE: Bilateral screening digital craniocaudal and mediolateral oblique mammograms were obtained. Bilateral screening digital breast tomosynthesis was performed. The images were evaluated with computer-aided detection. COMPARISON:  Previous exam(s). ACR Breast Density Category c: The breast tissue is heterogeneously dense, which may obscure small masses. FINDINGS: There are no findings suspicious for malignancy. IMPRESSION: No mammographic evidence of malignancy. A result letter of this screening mammogram will be mailed directly to the patient. RECOMMENDATION: Screening mammogram in one year. (Code:SM-B-01Y) BI-RADS CATEGORY  1: Negative. Electronically Signed   By: Franki Cabot M.D.   On: 02/07/2022 12:56    Recent Labs: Lab Results  Component Value Date   WBC 6.5 12/02/2021   HGB 14.7 12/02/2021   PLT 114 (L) 12/02/2021   NA 140 12/02/2021   K 4.3 12/02/2021   CL 106 12/02/2021   CO2 28 12/02/2021   GLUCOSE 92 12/02/2021   BUN 10 12/02/2021   CREATININE 0.77 12/02/2021   BILITOT 0.5 11/07/2021   ALKPHOS 65 07/12/2020   AST 14 11/07/2021   ALT 8 11/07/2021   PROT 7.2 11/07/2021   ALBUMIN 4.8 07/12/2020   CALCIUM 10.1 12/02/2021    Speciality Comments: No specialty comments available.  Procedures:  No procedures performed Allergies: Dilaudid [hydromorphone hcl], Morphine and related, Iodinated contrast media, Codeine, Other, Penicillins, Percocet [oxycodone-acetaminophen],  Prednisone, and Prozac [fluoxetine hcl]   Assessment / Plan:     Visit Diagnoses: No diagnosis found.  Orders: No orders of the defined types were placed in this encounter.  No orders of the defined types were placed in this encounter.   Face-to-face time spent with patient was *** minutes. Greater than 50% of time was spent in counseling and coordination of care.  Follow-Up Instructions: No follow-ups on file.   Collier Salina, MD  Note - This record has been  created using [employer].  Chart creation errors have been sought, but may not always  have been located. Such creation errors do not reflect on  the standard of medical care.

## 2022-02-19 ENCOUNTER — Encounter: Payer: Self-pay | Admitting: Internal Medicine

## 2022-02-19 ENCOUNTER — Ambulatory Visit: Attending: Internal Medicine | Admitting: Internal Medicine

## 2022-02-19 VITALS — BP 115/81 | HR 55 | Resp 15 | Ht 59.0 in | Wt 158.0 lb

## 2022-02-19 DIAGNOSIS — R0781 Pleurodynia: Secondary | ICD-10-CM

## 2022-02-19 DIAGNOSIS — D801 Nonfamilial hypogammaglobulinemia: Secondary | ICD-10-CM

## 2022-02-19 DIAGNOSIS — K1120 Sialoadenitis, unspecified: Secondary | ICD-10-CM

## 2022-02-19 DIAGNOSIS — G609 Hereditary and idiopathic neuropathy, unspecified: Secondary | ICD-10-CM

## 2022-02-19 DIAGNOSIS — H04123 Dry eye syndrome of bilateral lacrimal glands: Secondary | ICD-10-CM

## 2022-02-19 DIAGNOSIS — L538 Other specified erythematous conditions: Secondary | ICD-10-CM

## 2022-02-19 DIAGNOSIS — M791 Myalgia, unspecified site: Secondary | ICD-10-CM

## 2022-02-19 DIAGNOSIS — R682 Dry mouth, unspecified: Secondary | ICD-10-CM

## 2022-02-20 LAB — SEDIMENTATION RATE: Sed Rate: 6 mm/h (ref 0–30)

## 2022-02-20 LAB — SJOGRENS SYNDROME-A EXTRACTABLE NUCLEAR ANTIBODY: SSA (Ro) (ENA) Antibody, IgG: <1 AI

## 2022-02-20 LAB — RNP ANTIBODY: Ribonucleic Protein(ENA) Antibody, IgG: <1 AI

## 2022-02-20 LAB — SJOGRENS SYNDROME-B EXTRACTABLE NUCLEAR ANTIBODY: SSB (La) (ENA) Antibody, IgG: <1 AI

## 2022-02-20 LAB — C-REACTIVE PROTEIN: CRP: 0.4 mg/L (ref <8.0)

## 2022-02-20 LAB — C3 AND C4
C3 Complement: 140 mg/dL (ref 83–193)
C4 Complement: 46 mg/dL (ref 15–57)

## 2022-02-20 LAB — RHEUMATOID FACTOR: Rheumatoid fact SerPl-aCnc: <14 IU/mL (ref <14)

## 2022-02-26 NOTE — Progress Notes (Signed)
Lab results look fine no specific evidence of systemic inflammation or disease activity. As we discussed there may be additional risk of false negative with low immunoglobulins so does not rule out sjogren syndrome.

## 2022-02-28 ENCOUNTER — Encounter (HOSPITAL_BASED_OUTPATIENT_CLINIC_OR_DEPARTMENT_OTHER): Payer: Self-pay | Admitting: Pulmonary Disease

## 2022-02-28 ENCOUNTER — Ambulatory Visit (INDEPENDENT_AMBULATORY_CARE_PROVIDER_SITE_OTHER): Admitting: Pulmonary Disease

## 2022-02-28 VITALS — BP 112/80 | HR 67 | Ht 59.0 in | Wt 159.6 lb

## 2022-02-28 DIAGNOSIS — G9332 Myalgic encephalomyelitis/chronic fatigue syndrome: Secondary | ICD-10-CM

## 2022-02-28 DIAGNOSIS — U099 Post covid-19 condition, unspecified: Secondary | ICD-10-CM | POA: Diagnosis not present

## 2022-02-28 MED ORDER — FLUTICASONE-SALMETEROL 115-21 MCG/ACT IN AERO: 2.0000 | INHALATION_SPRAY | Freq: Two times a day (BID) | RESPIRATORY_TRACT | 2 refills | Status: DC

## 2022-02-28 NOTE — Patient Instructions (Addendum)
  Covid long hauler --ORDER pulmonary function tests. STOP inhaler 48 hours beforehand  --START Advair 115-21 mcg TWO puffs in the morning and TWO puffs in the evening. Rinse mouth out after use to prevent thrush. Please stop inhaler if you experience agitation, confusion or other symptoms similar to your prior use of oral steroids  --REFER pulmonary rehab  Follow-up with me after PFTs. OK to scheduled on separate days

## 2022-02-28 NOTE — Progress Notes (Unsigned)
Subjective:   PATIENT ID: Pamela Waller GENDER: female DOB: 1965-08-27, MRN: [id number]  Chief Complaint  Patient presents with   Consult    Had covid 4 times    Reason for Visit: New consult for COVID long hauler  [name] is a 56 year old active smoker with CVID on IVIG, chronic sinus infections, Sjogrens syndrome, OSA on CPAP, HTN who presents for evaluation for COVID long hauler.  She is followed by Rheumatology for Sjogren's and A&I for CVID on Xembify  She reports initial covid infection in 2020 x 2, 2021 and 2023. She reports the covid infection in June 2023 seemed to be the worst. She had congestion, frequent throat clearing but after the 2023 infection she has suffered worsening fatigue, dry cough, occasional shortness of breath, hair loss and voice changes. She reports she has increased sputum production since cutting back smoking. She reports wheezing but unsure if it is nasal or chest and occurs during day and night. She was seen in the ED in 11/2021 and concerned for pleurisy which was treated to PO to toradol. Has not had any similar symptoms since then. Has shortness of breath with walking. Attributes to her general fatigue. Not active at baseline. And has not been that way for years.  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 Started in her 18s but believes she smoked total 28 years x .75 packs = 21 pack years  I have personally reviewed patient's past medical/family/social history, allergies, current medications.  Past Medical History:  Diagnosis Date   Allergy    Chronic sinusitis 12/23/2018   Degenerative disc disease, cervical 11/07/2021   MRI proven 3 bulging disks with nerve pinches most notably on the left side    Depression    Endometriosis    Fatty liver 11/07/2021   GERD (gastroesophageal reflux disease)    Hyperlipidemia    Hypertension    Hypogammaglobulinemia ([place])    sub class 1 and 3 deficiency   Lumbar  degenerative disc disease 11/07/2021   Palmar erythema 11/07/2021   Panic attacks    Sinusitis    Vitamin B12 deficiency 12/23/2018   Vitamin D deficiency 12/23/2018     Family History  Problem Relation Age of Onset   Asthma Mother    Arthritis Mother    Breast cancer Mother    Pulmonary fibrosis Father    Hyperlipidemia Father    Hypertension Father    Breast cancer Sister    Stomach cancer Neg Hx    Colon cancer Neg Hx    Pancreatic cancer Neg Hx    Liver disease Neg Hx    Esophageal cancer Neg Hx      Social History   Occupational History   Not on file  Tobacco Use   Smoking status: Every Day    Packs/day: 0.50    Years: 28.00    Total pack years: 14.00    Types: Cigarettes    Passive exposure: Current   Smokeless tobacco: Never   Tobacco comments:    Started cigarettes age 67 cut down to half pk age 64. Per Dr. Neldon Mc she she start vaping instead, started dec 2023 and vapes 4 times a day  Vaping Use   Vaping Use: Every day   Substances: Nicotine  Substance and Sexual Activity   Alcohol use: Not Currently   Drug use: Never   Sexual activity: Not on file    Allergies  Allergen Reactions  Dilaudid [Hydromorphone Hcl] Anaphylaxis   Morphine And Related Anaphylaxis   Iodinated Contrast Media Hives    IVP dye.    Codeine Other (See Comments)   Other Hives   Penicillins Hives and Other (See Comments)   Percocet [Oxycodone-Acetaminophen] Nausea Only   Prednisone     psychosis   Prozac [Fluoxetine Hcl]      Outpatient Medications Prior to Visit  Medication Sig Dispense Refill   cholecalciferol (VITAMIN D3) 25 MCG (1000 UNIT) tablet Take 1 tablet (1,000 Units total) by mouth daily. 90 tablet 3   clonazePAM (KLONOPIN) 0.5 MG tablet Take 1 tablet by mouth 3 times a day as needed 90 tablet 5   cyanocobalamin (VITAMIN B12) 1000 MCG tablet Take 1 tablet (1,000 mcg total) by mouth daily. 100 tablet 3   diphenhydrAMINE (BENADRYL) 12.5 MG/5ML elixir Take by mouth  every 14 (fourteen) days. Take     EPINEPHrine 0.3 mg/0.3 mL IJ SOAJ injection Inject into the muscle.     Immune Globulin, Human,-klhw (XEMBIFY) 10 GM/50ML SOLN Inject 20 g into the skin every 14 (fourteen) days. 200 mL 11   ketorolac (TORADOL) 10 MG tablet Take 1 tablet (10 mg total) by mouth every 6 (six) hours as needed. 12 tablet 0   losartan (COZAAR) 25 MG tablet Take 1 tablet (25 mg total) by mouth daily. Take just half tablet daily for first month 90 tablet 3   meclizine (ANTIVERT) 25 MG tablet Take 1 tablet (25 mg total) by mouth 3 (three) times daily as needed for dizziness. 15 tablet 0   propranolol ER (INDERAL LA) 60 MG 24 hr capsule TAKE 1 CAPSULE BY MOUTH ONCE DAILY 90 capsule 0   rosuvastatin (CRESTOR) 20 MG tablet Take 1 tablet (20 mg total) by mouth daily. 90 tablet 0   sertraline (ZOLOFT) 100 MG tablet Take 2 tablets by mouth daily 180 tablet 1   No facility-administered medications prior to visit.    Review of Systems  Constitutional:  Negative for chills, diaphoresis, fever, malaise/fatigue and weight loss.  HENT:  Positive for congestion.   Respiratory:  Positive for cough, sputum production, shortness of breath and wheezing. Negative for hemoptysis.   Cardiovascular:  Negative for chest pain, palpitations and leg swelling.     Objective:   Vitals:   02/28/22 1533  BP: 112/80  Pulse: 67  SpO2: 97%  Weight: 159 lb 9.6 oz (72.4 kg)  Height: 4' 11"$  (1.499 m)   SpO2: 97 % O2 Device: None (Room air)  Physical Exam: General: Well-appearing, no acute distress HENT: [place], AT Eyes: EOMI, no scleral icterus Respiratory: Clear to auscultation bilaterally.  No crackles, wheezing or rales Cardiovascular: RRR, -M/R/G, no JVD Extremities:-Edema,-tenderness Neuro: AAO x4, CNII-XII grossly intact Psych: Normal mood, normal affect  Data Reviewed:  Imaging: CXR 12/02/21 - Normal. No infiltrate effusion or edema  PFT: None on file  Labs: CBC    Component Value  Date/Time   WBC 6.5 12/02/2021 1208   RBC 4.93 12/02/2021 1208   HGB 14.7 12/02/2021 1208   HGB 13.9 02/11/2021 1434   HCT 44.7 12/02/2021 1208   HCT 41.7 02/11/2021 1434   PLT 114 (L) 12/02/2021 1208   MCV 90.7 12/02/2021 1208   MCV 90 02/11/2021 1434   MCH 29.8 12/02/2021 1208   MCHC 32.9 12/02/2021 1208   RDW 13.0 12/02/2021 1208   RDW 12.6 02/11/2021 1434   LYMPHSABS 1,701 11/07/2021 1130   LYMPHSABS 2.4 02/11/2021 1434   MONOABS 0.4 07/12/2020  1354   EOSABS 148 11/07/2021 1130   EOSABS 0.3 02/11/2021 1434   BASOSABS 42 11/07/2021 1130   BASOSABS 0.1 02/11/2021 1434   Absolute eos  02/11/21 - 300     Assessment & Plan:   Discussion: 57 year old active smoker with CVID on IVIG, chronic sinus infections, Sjogrens syndrome, OSA on CPAP, HTN who presents for evaluation for COVID long hauler. Reviewed history. We reviewed clinical course of COVID-19 including long-term complications including post-inflammatory lung disease and long hauler symptoms.  Covid long hauler --ORDER pulmonary function tests. STOP inhaler 48 hours beforehand  --START Advair 115-21 mcg TWO puffs in the morning and TWO puffs in the evening. Rinse mouth out after use to prevent thrush. Please stop inhaler if you experience agitation, confusion or other symptoms similar to your prior use of oral steroids  --REFER pulmonary rehab  Health Maintenance Immunization History  Administered Date(s) Administered   Hepatitis B 05/15/2008, 06/14/2008   Hepatitis B, PED/ADOLESCENT 05/15/2008, 06/14/2008   Influenza Inj Mdck Quad Pf 12/31/2016   Influenza, Seasonal, Injecte, Preservative Fre 10/21/2014, 11/01/2015, 10/30/2017   Influenza,inj,Quad PF,6+ Mos 12/31/2016, 11/13/2020   Influenza,trivalent, recombinat, inj, PF 10/08/2007, 10/16/2008, 10/25/2009, 12/02/2012   Influenza-Unspecified 10/21/2014, 11/01/2015, 10/30/2017, 10/24/2019, 11/14/2021   PFIZER(Purple Top)SARS-COV-2 Vaccination 03/11/2019, 04/01/2019    PPD Test 08/18/2007, 06/20/2008, 09/04/2009   Pneumococcal Polysaccharide-23 12/08/2007, 10/10/2011, 03/05/2017   Tdap 08/18/2007, 10/10/2011   CT Lung Screen - qualified. Consider referral  Orders Placed This Encounter  Procedures   AMB referral to pulmonary rehabilitation    Referral Priority:   Routine    Referral Type:   Consultation    Number of Visits Requested:   1   Meds ordered this encounter  Medications   fluticasone-salmeterol (ADVAIR HFA) 115-21 MCG/ACT inhaler    Sig: Inhale 2 puffs into the lungs 2 (two) times daily.    Dispense:  1 each    Refill:  2    No follow-ups on file. After PFTs  I have spent a total time of 45-minutes on the day of the appointment reviewing prior documentation, coordinating care and discussing medical diagnosis and plan with the patient/family. Imaging, labs and tests included in this note have been reviewed and interpreted independently by me.  [place], MD [facility] 02/28/2022 3:39 PM  Office Number [phone]

## 2022-03-01 ENCOUNTER — Encounter (HOSPITAL_BASED_OUTPATIENT_CLINIC_OR_DEPARTMENT_OTHER): Payer: Self-pay | Admitting: Pulmonary Disease

## 2022-03-03 ENCOUNTER — Telehealth: Payer: Self-pay

## 2022-03-03 NOTE — Telephone Encounter (Signed)
Received sleep referral from Dr. Noemi Chapel at [facility]. For sleep apnea. Placed in sleep mailbox

## 2022-03-05 ENCOUNTER — Encounter (HOSPITAL_COMMUNITY): Payer: Self-pay

## 2022-03-07 ENCOUNTER — Encounter (HOSPITAL_BASED_OUTPATIENT_CLINIC_OR_DEPARTMENT_OTHER)

## 2022-03-10 ENCOUNTER — Ambulatory Visit (HOSPITAL_BASED_OUTPATIENT_CLINIC_OR_DEPARTMENT_OTHER): Admitting: Pulmonary Disease

## 2022-03-21 ENCOUNTER — Telehealth: Payer: Self-pay | Admitting: Internal Medicine

## 2022-03-21 NOTE — Telephone Encounter (Unsigned)
Patient called stating she never received a call letting her know if a biopsy was needed before her follow-up appointment on 03/24/22 so she cancelled it.  Patient states she spoke with Dr. Marveen Reeks assistant on 02/26/22 who reviewed her labwork results and was told she would receive a call letting her know if a biopsy was needed before the appointment.  Patient states that was a month ago and has yet to receive a call.

## 2022-03-24 ENCOUNTER — Ambulatory Visit: Admitting: Internal Medicine

## 2022-03-27 ENCOUNTER — Other Ambulatory Visit (HOSPITAL_BASED_OUTPATIENT_CLINIC_OR_DEPARTMENT_OTHER): Payer: Self-pay

## 2022-03-27 DIAGNOSIS — G9332 Myalgic encephalomyelitis/chronic fatigue syndrome: Secondary | ICD-10-CM

## 2022-03-28 ENCOUNTER — Encounter (HOSPITAL_BASED_OUTPATIENT_CLINIC_OR_DEPARTMENT_OTHER)

## 2022-04-08 ENCOUNTER — Ambulatory Visit: Admitting: Internal Medicine

## 2022-04-10 ENCOUNTER — Ambulatory Visit (HOSPITAL_BASED_OUTPATIENT_CLINIC_OR_DEPARTMENT_OTHER): Admitting: Pulmonary Disease

## 2022-04-17 ENCOUNTER — Ambulatory Visit: Admitting: Internal Medicine

## 2022-05-02 ENCOUNTER — Encounter: Payer: Self-pay | Admitting: Internal Medicine

## 2022-05-02 ENCOUNTER — Ambulatory Visit (INDEPENDENT_AMBULATORY_CARE_PROVIDER_SITE_OTHER): Admitting: Internal Medicine

## 2022-05-02 ENCOUNTER — Other Ambulatory Visit: Payer: Self-pay | Admitting: Internal Medicine

## 2022-05-02 VITALS — BP 114/80 | HR 64 | Temp 97.9°F | Ht 59.0 in | Wt 160.2 lb

## 2022-05-02 DIAGNOSIS — E669 Obesity, unspecified: Secondary | ICD-10-CM

## 2022-05-02 DIAGNOSIS — G729 Myopathy, unspecified: Secondary | ICD-10-CM | POA: Diagnosis not present

## 2022-05-02 DIAGNOSIS — E785 Hyperlipidemia, unspecified: Secondary | ICD-10-CM | POA: Diagnosis not present

## 2022-05-02 DIAGNOSIS — R7989 Other specified abnormal findings of blood chemistry: Secondary | ICD-10-CM

## 2022-05-02 DIAGNOSIS — I10 Essential (primary) hypertension: Secondary | ICD-10-CM

## 2022-05-02 DIAGNOSIS — D696 Thrombocytopenia, unspecified: Secondary | ICD-10-CM

## 2022-05-02 DIAGNOSIS — E559 Vitamin D deficiency, unspecified: Secondary | ICD-10-CM

## 2022-05-02 DIAGNOSIS — D801 Nonfamilial hypogammaglobulinemia: Secondary | ICD-10-CM

## 2022-05-02 DIAGNOSIS — F17200 Nicotine dependence, unspecified, uncomplicated: Secondary | ICD-10-CM

## 2022-05-02 DIAGNOSIS — E538 Deficiency of other specified B group vitamins: Secondary | ICD-10-CM | POA: Diagnosis not present

## 2022-05-02 DIAGNOSIS — R0683 Snoring: Secondary | ICD-10-CM

## 2022-05-02 DIAGNOSIS — U099 Post covid-19 condition, unspecified: Secondary | ICD-10-CM | POA: Diagnosis not present

## 2022-05-02 DIAGNOSIS — G629 Polyneuropathy, unspecified: Secondary | ICD-10-CM

## 2022-05-02 LAB — TSH: TSH: 2.89 u[IU]/mL (ref 0.35–5.50)

## 2022-05-02 LAB — CBC WITH DIFFERENTIAL/PLATELET
Basophils Absolute: 0 10*3/uL (ref 0.0–0.1)
Basophils Relative: 0.7 % (ref 0.0–3.0)
Eosinophils Absolute: 0.2 10*3/uL (ref 0.0–0.7)
Eosinophils Relative: 3.6 % (ref 0.0–5.0)
HCT: 44 % (ref 36.0–46.0)
Hemoglobin: 14.8 g/dL (ref 12.0–15.0)
Lymphocytes Relative: 28.7 % (ref 12.0–46.0)
Lymphs Abs: 1.9 10*3/uL (ref 0.7–4.0)
MCHC: 33.6 g/dL (ref 30.0–36.0)
MCV: 89.1 fl (ref 78.0–100.0)
Monocytes Absolute: 0.3 10*3/uL (ref 0.1–1.0)
Monocytes Relative: 4.7 % (ref 3.0–12.0)
Neutro Abs: 4.1 10*3/uL (ref 1.4–7.7)
Neutrophils Relative %: 62.3 % (ref 43.0–77.0)
Platelets: 117 10*3/uL — ABNORMAL LOW (ref 150.0–400.0)
RBC: 4.93 Mil/uL (ref 3.87–5.11)
RDW: 13.9 % (ref 11.5–15.5)
WBC: 6.6 10*3/uL (ref 4.0–10.5)

## 2022-05-02 LAB — VITAMIN D 25 HYDROXY (VIT D DEFICIENCY, FRACTURES): VITD: 25.92 ng/mL — ABNORMAL LOW (ref 30.00–100.00)

## 2022-05-02 LAB — CK: Total CK: 159 U/L (ref 7–177)

## 2022-05-02 LAB — LIPID PANEL
Cholesterol: 169 mg/dL (ref 0–200)
HDL: 45.5 mg/dL (ref >39.00)
LDL Cholesterol: 101 mg/dL — ABNORMAL HIGH (ref 0–99)
NonHDL: 123.29
Total CHOL/HDL Ratio: 4
Triglycerides: 113 mg/dL (ref 0.0–149.0)
VLDL: 22.6 mg/dL (ref 0.0–40.0)

## 2022-05-02 LAB — COMPREHENSIVE METABOLIC PANEL
ALT: 11 U/L (ref 0–35)
AST: 17 U/L (ref 0–37)
Albumin: 4.6 g/dL (ref 3.5–5.2)
Alkaline Phosphatase: 79 U/L (ref 39–117)
BUN: 12 mg/dL (ref 6–23)
CO2: 25 mEq/L (ref 19–32)
Calcium: 9.9 mg/dL (ref 8.4–10.5)
Chloride: 108 mEq/L (ref 96–112)
Creatinine, Ser: 0.71 mg/dL (ref 0.40–1.20)
GFR: 94.75 mL/min (ref >60.00)
Glucose, Bld: 90 mg/dL (ref 70–99)
Potassium: 4.3 mEq/L (ref 3.5–5.1)
Sodium: 141 mEq/L (ref 135–145)
Total Bilirubin: 0.7 mg/dL (ref 0.2–1.2)
Total Protein: 7 g/dL (ref 6.0–8.3)

## 2022-05-02 LAB — HEMOGLOBIN A1C: Hgb A1c MFr Bld: 5.2 % (ref 4.6–6.5)

## 2022-05-02 LAB — T4, FREE: Free T4: 0.85 ng/dL (ref 0.60–1.60)

## 2022-05-02 LAB — B12 AND FOLATE PANEL
Folate: 5.7 ng/mL — ABNORMAL LOW (ref >5.9)
Vitamin B-12: 275 pg/mL (ref 211–911)

## 2022-05-02 MED ORDER — METFORMIN HCL 500 MG PO TABS
500.0000 mg | ORAL_TABLET | Freq: Two times a day (BID) | ORAL | 3 refills | Status: DC
Start: 2022-05-02 — End: 2022-08-26

## 2022-05-02 MED ORDER — HYDROCHLOROTHIAZIDE 25 MG PO TABS
25.0000 mg | ORAL_TABLET | Freq: Every day | ORAL | 3 refills | Status: DC
Start: 2022-05-02 — End: 2022-05-13

## 2022-05-02 MED ORDER — TOPIRAMATE 25 MG PO TABS
25.0000 mg | ORAL_TABLET | Freq: Two times a day (BID) | ORAL | 3 refills | Status: DC
Start: 2022-05-02 — End: 2022-08-26

## 2022-05-02 MED ORDER — CYANOCOBALAMIN 1000 MCG/ML IJ SOLN
INTRAMUSCULAR | 15 refills | Status: DC
Start: 2022-05-02 — End: 2022-08-26

## 2022-05-02 NOTE — Assessment & Plan Note (Signed)
Stable with supplement

## 2022-05-02 NOTE — Assessment & Plan Note (Signed)
We discussed metformin and topiramate because she cannot do Wellbutrin and cannot get approved for a GLP-1 agonist yet. I sent in both metformin and topiramate and gave her handouts about them and encouraged her to try them both but not both on the same day first at first

## 2022-05-02 NOTE — Assessment & Plan Note (Signed)
Reviewed available data from patient and  BP Readings from Last 3 Encounters:  05/02/22 114/80  02/28/22 112/80  02/19/22 115/81  Pressures are fine with just losartan but she feels bloated due to it so I will get a go back to HCTZ at her request My individualized, goal average blood pressure for this patient, after considering the evidence for and against aggressive blood pressure goals as well as their past medical history and preferences, is 140/90 In my medical opinion, this problem is stable, well controlled  We discussed the importance of medication adherence for managing this condition, and the risks of leaving it suboptimally managed.  The patient acknowledged the information presented but respectfully declined to take medications at this time.  We emphasized the importance of contacting us if their condition worsens or they change their mind about medication. Information for patient review: Please limit and avoid: salt, alcohol, NSAIDS, excess body weight, smoking, stress, sedentary lifestyles The risks of poor control over time are FUTURE stroke and heart attacks, but if you have a blood pressure over 180 and any red flag symptoms(headache/shortness of breath/confusion/chest discomfort) you should go to the ER.  You are encouraged to do home blood pressure monitoring, at least as many times per week as blood pressure medications you are on.  For example, bring a diary with 3 home blood pressure readings per week to each visit if you are on 3 blood pressure medications.   If you are on more than 3 medication(s) or have certain risk factors, we should do a resistant hypertension workup See AFTER VISIT SUMMARY for addition educational information provided Please let me know in advance when you need medication(s) refills, consistently taking your medication is very important!

## 2022-05-02 NOTE — Assessment & Plan Note (Signed)
She plans on getting addressed via her neurologist She is going to see separate pulmonary     Doesn't want pro air due to history thrush with them.

## 2022-05-03 LAB — LYME DISEASE SEROLOGY W/REFLEX

## 2022-05-03 NOTE — Progress Notes (Signed)
[facility]: [phone]   Routine Medical Office Visit  Patient:  Pamela Waller      Age: 57 y.o.       Sex:  female  Date:   05/02/2022 PCP:    Lula Olszewski, MD   Today's Healthcare Provider: Lula Olszewski, MD   Assessment and Plan:   She would like thorough battery of labs for all her medical issues, and referral to alternative pulmonary specialist   Hyperlipidemia LDL goal <100  Long COVID -     Ambulatory referral to Pulmonology -     TSH  Snoring Assessment & Plan: She plans on getting addressed via her neurologist She is going to see separate pulmonary     Doesn't want pro air due to history thrush with them.   Vitamin D deficiency Assessment & Plan: Stable with supplement   Orders: -     VITAMIN D 25 Hydroxy (Vit-D Deficiency, Fractures)  B12 deficiency -     B12 and Folate Panel -     Cyanocobalamin; 1000 mcg (1 mg) injection once per week for four weeks, followed by 1000 mcg injection once per month.  Dispense: 1 mL; Refill: 15 -     Intrinsic Factor Antibodies -     Methylmalonic acid, serum  Obesity due to energy imbalance Assessment & Plan: We discussed metformin and topiramate because she cannot do Wellbutrin and cannot get approved for a GLP-1 agonist yet. I sent in both metformin and topiramate and gave her handouts about them and encouraged her to try them both but not both on the same day first at first  Orders: -     Lipid panel -     TSH -     Comprehensive metabolic panel -     CBC with Differential/Platelet -     Hemoglobin A1c -     metFORMIN HCl; Take 1 tablet (500 mg total) by mouth 2 (two) times daily with a meal. Start at 1 tablet daily for first week Do not start within 1 week of another medicine being started  Dispense: 180 tablet; Refill: 3 -     Topiramate; Take 1 tablet (25 mg total) by mouth 2 (two) times daily. To start.  Just 25 mg each mornings for first week.  Increases heatstroke risk. Stop  if pregnant.  Seek immediate medical attention if vision changes.  Dispense: 180 tablet; Refill: 3  Myopathy -     CK  Hypogammaglobulinemia -     IgG  Raised TSH level -     T4, free  Neuropathy -     RPR -     Lyme Disease Serology w/Reflex  Tobacco use disorder -     CT CHEST LUNG CANCER SCREENING LOW DOSE WO CONTRAST; Future  Hypertension, unspecified type Assessment & Plan: Reviewed available data from patient and  BP Readings from Last 3 Encounters:  05/02/22 114/80  02/28/22 112/80  02/19/22 115/81  Pressures are fine with just losartan but she feels bloated due to it so I will get a go back to HCTZ at her request My individualized, goal average blood pressure for this patient, after considering the evidence for and against aggressive blood pressure goals as well as their past medical history and preferences, is 140/90 In my medical opinion, this problem is stable, well controlled  We discussed the importance of medication adherence for managing this condition, and the risks of leaving it suboptimally managed.  The patient  acknowledged the information presented but respectfully declined to take medications at this time.  We emphasized the importance of contacting us if their condition worsens or they change their mind about medication. Information for patient review: Please limit and avoid: salt, alcohol, NSAIDS, excess body weight, smoking, stress, sedentary lifestyles The risks of poor control over time are FUTURE stroke and heart attacks, but if you have a blood pressure over 180 and any red flag symptoms(headache/shortness of breath/confusion/chest discomfort) you should go to the ER.  You are encouraged to do home blood pressure monitoring, at least as many times per week as blood pressure medications you are on.  For example, bring a diary with 3 home blood pressure readings per week to each visit if you are on 3 blood pressure medications.   If you are on more than 3  medication(s) or have certain risk factors, we should do a resistant hypertension workup See AFTER VISIT SUMMARY for addition educational information provided Please let me know in advance when you need medication(s) refills, consistently taking your medication is very important!   Orders: -     hydroCHLOROthiazide; Take 1 tablet (25 mg total) by mouth daily.  Dispense: 90 tablet; Refill: 3  Thrombocytopenic disorder -     HIV Antibody (routine testing w rflx)       Clinical Presentation:   57 y.o. female here today for Follow-up, Hypertension (Losartan does not seen to lower.), and Weight Gain (Since stopping the hydrochlorothiazide has not been able to lose the extra four pounds that she has gained.)  Reviewed:  has a past medical history of Allergy, Chronic sinusitis (12/23/2018), Degenerative disc disease, cervical (11/07/2021), Depression, Endometriosis, Fatty liver (11/07/2021), GERD (gastroesophageal reflux disease), Hyperlipidemia, Hypertension, Hypogammaglobulinemia, Lumbar degenerative disc disease (11/07/2021), Palmar erythema (11/07/2021), Panic attacks, Sinusitis, Vitamin B12 deficiency (12/23/2018), and Vitamin D deficiency (12/23/2018). Active Ambulatory Problems    Diagnosis Date Noted   Generalized anxiety disorder with panic attacks 02/05/2018   BRCA2 gene mutation positive 03/11/2011   Fatigue 12/04/2009   Hypertension 06/26/2015   Gastroesophageal reflux disease without esophagitis 02/08/2015   Hyperlipidemia LDL goal <100 12/04/2009   Hypogammaglobulinemia 12/04/2009   Idiopathic peripheral neuropathy 02/08/2015   Myalgia 01/27/2013   Nicotine dependence 12/02/2012   Recurrent major depressive disorder, in partial remission 02/08/2015   Sialadenitis 02/09/2018   Chronic sinusitis 12/23/2018   Vitamin D deficiency 12/23/2018   B12 deficiency 12/23/2018   Hypersomnolence 12/25/2018   Upper back pain on left side 12/27/2019   Abnormal mammogram 05/17/2020    Allergic rhinitis due to pollen 07/11/2016   Bereavement, uncomplicated 05/17/2020   Depressive disorder 07/11/2016   Disorder of vision 09/23/2016   Fibroadenosis of breast 05/17/2020   Neuropathy 09/23/2016   Thrombocytopenic disorder 10/06/2016   Raised TSH level 10/09/2016   Snoring 10/06/2016   Stress incontinence 05/17/2020   Tobacco use disorder 05/17/2020   Gastritis 07/11/2016   Sinusitis 10/06/2016   Insomnia 09/23/2016   Memory changes 07/14/2020   Dry cough 07/17/2021   Family history of autoimmune disorder 07/17/2021   Dry mouth and eyes 07/17/2021   Palmar erythema 11/07/2021   Fatty liver 11/07/2021   Degenerative disc disease, cervical 11/07/2021   Lumbar degenerative disc disease 11/07/2021   Pleuritic pain 12/02/2021   Bradycardia 02/07/2022   OSA (obstructive sleep apnea) 02/07/2022   Vertigo 02/07/2022   Long COVID 02/07/2022   Obesity due to energy imbalance 05/02/2022   Myopathy 05/02/2022   Resolved Ambulatory Problems  Diagnosis Date Noted   Encounter for routine gynecological examination 12/23/2018   Generalized anxiety disorder 07/11/2016   Allergic rhinitis 12/23/2018   Aftercare following other surgery of genitourinary system 05/17/2020   Menorrhagia 05/17/2020   Metrorrhagia 05/17/2020   Leiomyoma of uterus 05/17/2020   Barrett's esophagus 09/23/2016   Gastroesophageal reflux disease 07/11/2016   Edema 11/08/2012   Encounter for well adult exam with abnormal findings 07/14/2020   Past Medical History:  Diagnosis Date   Allergy    Depression    Endometriosis    GERD (gastroesophageal reflux disease)    Hyperlipidemia    Panic attacks    Vitamin B12 deficiency 12/23/2018    Outpatient Medications Prior to Visit  Medication Sig   cholecalciferol (VITAMIN D3) 25 MCG (1000 UNIT) tablet Take 1 tablet (1,000 Units total) by mouth daily.   clonazePAM (KLONOPIN) 0.5 MG tablet Take 1 tablet by mouth 3 times a day as needed    cyanocobalamin (VITAMIN B12) 1000 MCG tablet Take 1 tablet (1,000 mcg total) by mouth daily.   diphenhydrAMINE (BENADRYL) 12.5 MG/5ML elixir Take by mouth every 14 (fourteen) days. Take   EPINEPHrine 0.3 mg/0.3 mL IJ SOAJ injection Inject into the muscle.   fluticasone-salmeterol (ADVAIR HFA) 115-21 MCG/ACT inhaler Inhale 2 puffs into the lungs 2 (two) times daily.   Immune Globulin, Human,-klhw (XEMBIFY) 10 GM/50ML SOLN Inject 20 g into the skin every 14 (fourteen) days.   ketorolac (TORADOL) 10 MG tablet Take 1 tablet (10 mg total) by mouth every 6 (six) hours as needed.   losartan (COZAAR) 25 MG tablet Take 1 tablet (25 mg total) by mouth daily. Take just half tablet daily for first month   meclizine (ANTIVERT) 25 MG tablet Take 1 tablet (25 mg total) by mouth 3 (three) times daily as needed for dizziness.   propranolol ER (INDERAL LA) 60 MG 24 hr capsule TAKE 1 CAPSULE BY MOUTH ONCE DAILY   sertraline (ZOLOFT) 100 MG tablet Take 2 tablets by mouth daily   [DISCONTINUED] rosuvastatin (CRESTOR) 20 MG tablet Take 1 tablet (20 mg total) by mouth daily.   No facility-administered medications prior to visit.    HPI  Updated and modified:  Problem  Obesity Due to Energy Imbalance  Vitamin D Deficiency   Vitamin D Levels Lab Results  Component Value Date/Time   VD25OH 30 11/07/2021 11:30 AM   VD25OH 49.57 07/12/2020 01:54 PM   VD25OH 32.79 12/27/2019 10:03 AM   VD25OH 33.07 12/23/2018 03:52 PM     Snoring   STOP-Bang Score Do you snore loudly?: Yes Do you often feel tired, fatigued, or sleepy during the daytime?: Yes Has anyone observed you stop breathing during sleep?: No but desatted 17 mins Do you have (or are you being treated for) high blood pressure?: Yes Recent BMI (Calculated):  BMI Readings from Last 1 Encounters:  05/02/22 32.36 kg/m   Is BMI greater than 35 kg/m2?:  No Age older than 56 years old?: Yes Has large neck size > 40 cm (15.7 in, large female shirt size, large  female collar size > 16): No Gender Female?:  No STOP-Bang Total Score(total yes answers):  4    Hypertension   Home readings:not checking Patient reports taking current medications consistently and not experiencing any significant associated side effects or symptoms  As of 05/02/2022 Current hypertension medications:       Sig   losartan (COZAAR) 25 MG tablet Take 1 tablet (25 mg total) by mouth daily. Take just  half tablet daily for first month   propranolol ER (INDERAL LA) 60 MG 24 hr capsule TAKE 1 CAPSULE BY MOUTH ONCE DAILY      Lab Results  Component Value Date   NA 140 12/02/2021   K 4.3 12/02/2021                Clinical Data Analysis:   Physical Exam  BP 114/80 (BP Location: Left Arm, Patient Position: Sitting)   Pulse 64   Temp 97.9 F (36.6 C) (Temporal)   Ht 4\' 11"  (1.499 m)   Wt 160 lb 3.2 oz (72.7 kg)   SpO2 97%   BMI 32.36 kg/m  Wt Readings from Last 10 Encounters:  05/02/22 160 lb 3.2 oz (72.7 kg)  02/28/22 159 lb 9.6 oz (72.4 kg)  02/19/22 158 lb (71.7 kg)  02/07/22 156 lb (70.8 kg)  12/02/21 158 lb 6.4 oz (71.8 kg)  11/07/21 159 lb 9.6 oz (72.4 kg)  10/29/21 157 lb 6.4 oz (71.4 kg)  07/17/21 158 lb 9.6 oz (71.9 kg)  02/19/21 160 lb (72.6 kg)  08/07/20 155 lb (70.3 kg)   Vital signs reviewed.  Nursing notes reviewed. Weight trend reviewed.  All normal/stable. Abnormalities and Problem-Specific physical exam findings:  healthy appearance today General Appearance:  No acute distress appreciable.   Well-groomed, healthy-appearing female.  Well proportioned with no abnormal fat distribution.  Good muscle tone. Skin: Clear and well-hydrated. Pulmonary:  Normal work of breathing at rest, no respiratory distress apparent. SpO2: 97 %  Neurological:  Awake, alert, oriented, and engaged.  No obvious focal neurological deficits or cognitive impairments.  Sensorium seems unclouded. Gait is smooth and coordinated.  Speech is clear and coherent with logical  content. Psychiatric:  Appropriate mood, pleasant and cooperative demeanor, cheerful and engaged during the exam    Additional Results Reviewed:     Results for orders placed or performed in visit on 05/02/22  Lipid panel  Result Value Ref Range   Cholesterol 169 0 - 200 mg/dL   Triglycerides 161.0 0.0 - 149.0 mg/dL   HDL 96.04 >54.09 mg/dL   VLDL 81.1 0.0 - 91.4 mg/dL   LDL Cholesterol 782 (H) 0 - 99 mg/dL   Total CHOL/HDL Ratio 4    NonHDL 123.29   TSH  Result Value Ref Range   TSH 2.89 0.35 - 5.50 uIU/mL  Comprehensive metabolic panel  Result Value Ref Range   Sodium 141 135 - 145 mEq/L   Potassium 4.3 3.5 - 5.1 mEq/L   Chloride 108 96 - 112 mEq/L   CO2 25 19 - 32 mEq/L   Glucose, Bld 90 70 - 99 mg/dL   BUN 12 6 - 23 mg/dL   Creatinine, Ser 9.56 0.40 - 1.20 mg/dL   Total Bilirubin 0.7 0.2 - 1.2 mg/dL   Alkaline Phosphatase 79 39 - 117 U/L   AST 17 0 - 37 U/L   ALT 11 0 - 35 U/L   Total Protein 7.0 6.0 - 8.3 g/dL   Albumin 4.6 3.5 - 5.2 g/dL   GFR 21.30 >86.57 mL/min   Calcium 9.9 8.4 - 10.5 mg/dL  CBC with Differential/Platelet  Result Value Ref Range   WBC 6.6 4.0 - 10.5 K/uL   RBC 4.93 3.87 - 5.11 Mil/uL   Hemoglobin 14.8 12.0 - 15.0 g/dL   HCT 84.6 96.2 - 95.2 %   MCV 89.1 78.0 - 100.0 fl   MCHC 33.6 30.0 - 36.0 g/dL   RDW 84.1 32.4 -  15.5 %   Platelets 117.0 (L) 150.0 - 400.0 K/uL   Neutrophils Relative % 62.3 43.0 - 77.0 %   Lymphocytes Relative 28.7 12.0 - 46.0 %   Monocytes Relative 4.7 3.0 - 12.0 %   Eosinophils Relative 3.6 0.0 - 5.0 %   Basophils Relative 0.7 0.0 - 3.0 %   Neutro Abs 4.1 1.4 - 7.7 K/uL   Lymphs Abs 1.9 0.7 - 4.0 K/uL   Monocytes Absolute 0.3 0.1 - 1.0 K/uL   Eosinophils Absolute 0.2 0.0 - 0.7 K/uL   Basophils Absolute 0.0 0.0 - 0.1 K/uL  VITAMIN D 25 Hydroxy (Vit-D Deficiency, Fractures)  Result Value Ref Range   VITD 25.92 (L) 30.00 - 100.00 ng/mL  HgB A1c  Result Value Ref Range   Hgb A1c MFr Bld 5.2 4.6 - 6.5 %  B12 and  Folate Panel  Result Value Ref Range   Vitamin B-12 275 211 - 911 pg/mL   Folate 5.7 (L) >5.9 ng/mL  CK (Creatine Kinase)  Result Value Ref Range   Total CK 159 7 - 177 U/L  T4, free  Result Value Ref Range   Free T4 0.85 0.60 - 1.60 ng/dL  RPR  Result Value Ref Range   RPR Ser Ql NON-REACTIVE NON-REACTIVE  Lyme Disease Serology w/Reflex  Result Value Ref Range   Lyme Total Antibody EIA CANCELED   HIV antibody (with reflex)  Result Value Ref Range   HIV 1&2 Ab, 4th Generation NON-REACTIVE NON-REACTIVE    Recent Results (from the past 2160 hour(s))  Sedimentation rate     Status: None   Collection Time: 02/19/22 10:18 AM  Result Value Ref Range   Sed Rate 6 0 - 30 mm/h  C-reactive protein     Status: None   Collection Time: 02/19/22 10:18 AM  Result Value Ref Range   CRP 0.4 <8.0 mg/L  RNP Antibody     Status: None   Collection Time: 02/19/22 10:18 AM  Result Value Ref Range   Ribonucleic Protein(ENA) Antibody, IgG <1.0 NEG <1.0 NEG AI  Sjogrens syndrome-A extractable nuclear antibody     Status: None   Collection Time: 02/19/22 10:18 AM  Result Value Ref Range   SSA (Ro) (ENA) Antibody, IgG <1.0 NEG <1.0 NEG AI  Sjogrens syndrome-B extractable nuclear antibody     Status: None   Collection Time: 02/19/22 10:18 AM  Result Value Ref Range   SSB (La) (ENA) Antibody, IgG <1.0 NEG <1.0 NEG AI  C3 and C4     Status: None   Collection Time: 02/19/22 10:18 AM  Result Value Ref Range   C3 Complement 140 83 - 193 mg/dL   C4 Complement 46 15 - 57 mg/dL  Rheumatoid factor     Status: None   Collection Time: 02/19/22 10:18 AM  Result Value Ref Range   Rheumatoid fact SerPl-aCnc <14 <14 IU/mL  Lipid panel     Status: Abnormal   Collection Time: 05/02/22  2:19 PM  Result Value Ref Range   Cholesterol 169 0 - 200 mg/dL    Comment: ATP III Classification       Desirable:  < 200 mg/dL               Borderline High:  200 - 239 mg/dL          High:  > = 341 mg/dL   Triglycerides  962.2 0.0 - 149.0 mg/dL    Comment: Normal:  <297 mg/dLBorderline High:  150 -  199 mg/dL   HDL 78.29 >56.21 mg/dL   VLDL 30.8 0.0 - 65.7 mg/dL   LDL Cholesterol 846 (H) 0 - 99 mg/dL   Total CHOL/HDL Ratio 4     Comment:                Men          Women1/2 Average Risk     3.4          3.3Average Risk          5.0          4.42X Average Risk          9.6          7.13X Average Risk          15.0          11.0                       NonHDL 123.29     Comment: NOTE:  Non-HDL goal should be 30 mg/dL higher than patient's LDL goal (i.e. LDL goal of < 70 mg/dL, would have non-HDL goal of < 100 mg/dL)  TSH     Status: None   Collection Time: 05/02/22  2:19 PM  Result Value Ref Range   TSH 2.89 0.35 - 5.50 uIU/mL  Comprehensive metabolic panel     Status: None   Collection Time: 05/02/22  2:19 PM  Result Value Ref Range   Sodium 141 135 - 145 mEq/L   Potassium 4.3 3.5 - 5.1 mEq/L   Chloride 108 96 - 112 mEq/L   CO2 25 19 - 32 mEq/L   Glucose, Bld 90 70 - 99 mg/dL   BUN 12 6 - 23 mg/dL   Creatinine, Ser 9.62 0.40 - 1.20 mg/dL   Total Bilirubin 0.7 0.2 - 1.2 mg/dL   Alkaline Phosphatase 79 39 - 117 U/L   AST 17 0 - 37 U/L   ALT 11 0 - 35 U/L   Total Protein 7.0 6.0 - 8.3 g/dL   Albumin 4.6 3.5 - 5.2 g/dL   GFR 95.28 >41.32 mL/min    Comment: Calculated using the CKD-EPI Creatinine Equation (2021)   Calcium 9.9 8.4 - 10.5 mg/dL  CBC with Differential/Platelet     Status: Abnormal   Collection Time: 05/02/22  2:19 PM  Result Value Ref Range   WBC 6.6 4.0 - 10.5 K/uL   RBC 4.93 3.87 - 5.11 Mil/uL   Hemoglobin 14.8 12.0 - 15.0 g/dL   HCT 44.0 10.2 - 72.5 %   MCV 89.1 78.0 - 100.0 fl   MCHC 33.6 30.0 - 36.0 g/dL   RDW 36.6 44.0 - 34.7 %   Platelets 117.0 (L) 150.0 - 400.0 K/uL    Comment: Giant Platelets seen on smear.   Neutrophils Relative % 62.3 43.0 - 77.0 %   Lymphocytes Relative 28.7 12.0 - 46.0 %   Monocytes Relative 4.7 3.0 - 12.0 %   Eosinophils Relative 3.6 0.0 - 5.0 %    Basophils Relative 0.7 0.0 - 3.0 %   Neutro Abs 4.1 1.4 - 7.7 K/uL   Lymphs Abs 1.9 0.7 - 4.0 K/uL   Monocytes Absolute 0.3 0.1 - 1.0 K/uL   Eosinophils Absolute 0.2 0.0 - 0.7 K/uL   Basophils Absolute 0.0 0.0 - 0.1 K/uL  VITAMIN D 25 Hydroxy (Vit-D Deficiency, Fractures)     Status: Abnormal   Collection Time: 05/02/22  2:19 PM  Result Value Ref Range   VITD 25.92 (L) 30.00 - 100.00 ng/mL  HgB A1c     Status: None   Collection Time: 05/02/22  2:19 PM  Result Value Ref Range   Hgb A1c MFr Bld 5.2 4.6 - 6.5 %    Comment: Glycemic Control Guidelines for People with Diabetes:Non Diabetic:  <6%Goal of Therapy: <7%Additional Action Suggested:  >8%   B12 and Folate Panel     Status: Abnormal   Collection Time: 05/02/22  2:19 PM  Result Value Ref Range   Vitamin B-12 275 211 - 911 pg/mL   Folate 5.7 (L) >5.9 ng/mL  CK (Creatine Kinase)     Status: None   Collection Time: 05/02/22  2:19 PM  Result Value Ref Range   Total CK 159 7 - 177 U/L  T4, free     Status: None   Collection Time: 05/02/22  2:19 PM  Result Value Ref Range   Free T4 0.85 0.60 - 1.60 ng/dL    Comment: Specimens from patients who are undergoing biotin therapy and /or ingesting biotin supplements may contain high levels of biotin.  The higher biotin concentration in these specimens interferes with this Free T4 assay.  Specimens that contain high levels  of biotin may cause false high results for this Free T4 assay.  Please interpret results in light of the total clinical presentation of the patient.    RPR     Status: None   Collection Time: 05/02/22  2:19 PM  Result Value Ref Range   RPR Ser Ql NON-REACTIVE NON-REACTIVE    Comment: . No laboratory evidence of syphilis. If recent exposure is suspected, submit a new sample in 2-4 weeks. .   Lyme Disease Serology w/Reflex     Status: None   Collection Time: 05/02/22  2:19 PM  Result Value Ref Range   Lyme Total Antibody EIA CANCELED     Comment: Test not performed.  No serum received.  Result canceled by the ancillary.   HIV antibody (with reflex)     Status: None   Collection Time: 05/02/22  2:19 PM  Result Value Ref Range   HIV 1&2 Ab, 4th Generation NON-REACTIVE NON-REACTIVE    Comment: HIV-1 antigen and HIV-1/HIV-2 antibodies were not detected. There is no laboratory evidence of HIV infection. Marland Kitchen PLEASE NOTE: This information has been disclosed to you from records whose confidentiality may be protected by state law.  If your state requires such protection, then the state law prohibits you from making any further disclosure of the information without the specific written consent of the person to whom it pertains, or as otherwise permitted by law. A general authorization for the release of medical or other information is NOT sufficient for this purpose. . For additional information please refer to http://education.[facility].com/faq/FAQ106 (This link is being provided for informational/ educational purposes only.) . Marland Kitchen The performance of this assay has not been clinically validated in patients less than 41 years old. .     No image results found.   MM 3D SCREEN BREAST BILATERAL  Result Date: 02/07/2022 CLINICAL DATA:  Screening. EXAM: DIGITAL SCREENING BILATERAL MAMMOGRAM WITH TOMOSYNTHESIS AND CAD TECHNIQUE: Bilateral screening digital craniocaudal and mediolateral oblique mammograms were obtained. Bilateral screening digital breast tomosynthesis was performed. The images were evaluated with computer-aided detection. COMPARISON:  Previous exam(s). ACR Breast Density Category c: The breast tissue is heterogeneously dense, which may obscure small masses. FINDINGS: There are no findings suspicious for malignancy. IMPRESSION: No mammographic  evidence of malignancy. A result letter of this screening mammogram will be mailed directly to the patient. RECOMMENDATION: Screening mammogram in one year. (Code:SM-B-01Y) BI-RADS CATEGORY  1:  Negative. Electronically Signed   By: Bary Richard M.D.   On: 02/07/2022 12:56     --------------------------------    Signed: Lula Olszewski, MD 05/03/2022 8:12 PM

## 2022-05-04 LAB — IGG: IgG (Immunoglobin G), Serum: 998 mg/dL (ref 600–1640)

## 2022-05-05 LAB — INTRINSIC FACTOR ANTIBODIES: Intrinsic Factor: NEGATIVE

## 2022-05-06 ENCOUNTER — Other Ambulatory Visit: Payer: Self-pay

## 2022-05-06 ENCOUNTER — Ambulatory Visit: Admitting: Allergy and Immunology

## 2022-05-06 VITALS — BP 128/78 | HR 58 | Temp 97.3°F | Resp 16 | Ht 60.0 in | Wt 159.4 lb

## 2022-05-06 DIAGNOSIS — F17210 Nicotine dependence, cigarettes, uncomplicated: Secondary | ICD-10-CM | POA: Diagnosis not present

## 2022-05-06 DIAGNOSIS — D839 Common variable immunodeficiency, unspecified: Secondary | ICD-10-CM

## 2022-05-06 DIAGNOSIS — J328 Other chronic sinusitis: Secondary | ICD-10-CM

## 2022-05-06 DIAGNOSIS — M3501 Sicca syndrome with keratoconjunctivitis: Secondary | ICD-10-CM

## 2022-05-06 DIAGNOSIS — D801 Nonfamilial hypogammaglobulinemia: Secondary | ICD-10-CM

## 2022-05-06 DIAGNOSIS — K219 Gastro-esophageal reflux disease without esophagitis: Secondary | ICD-10-CM

## 2022-05-06 LAB — RPR: RPR Ser Ql: NONREACTIVE

## 2022-05-06 LAB — HIV ANTIBODY (ROUTINE TESTING W REFLEX): HIV 1&2 Ab, 4th Generation: NONREACTIVE

## 2022-05-06 LAB — METHYLMALONIC ACID, SERUM: Methylmalonic Acid, Quant: 116 nmol/L (ref 87–318)

## 2022-05-06 MED ORDER — SYSTANE COMPLETE 0.6 % OP SOLN: 1.0000 [drp] | Freq: Four times a day (QID) | OPHTHALMIC | 1 refills | Status: DC | PRN

## 2022-05-06 MED ORDER — OMEPRAZOLE 40 MG PO CPDR: 40.0000 mg | DELAYED_RELEASE_CAPSULE | Freq: Every morning | ORAL | 1 refills | Status: DC

## 2022-05-06 MED ORDER — BIOTENE DRY MOUTH MT LIQD: 15.0000 mL | OROMUCOSAL | 1 refills | Status: DC | PRN

## 2022-05-06 MED ORDER — NYSTATIN 100000 UNIT/ML MT SUSP: 5.0000 mL | Freq: Two times a day (BID) | OROMUCOSAL | 1 refills | Status: DC

## 2022-05-06 MED ORDER — LORATADINE 10 MG PO TABS: 10.0000 mg | ORAL_TABLET | Freq: Two times a day (BID) | ORAL | 1 refills | Status: DC

## 2022-05-06 MED ORDER — FAMOTIDINE 40 MG PO TABS: 40.0000 mg | ORAL_TABLET | Freq: Every evening | ORAL | 1 refills | Status: DC

## 2022-05-06 NOTE — Progress Notes (Signed)
[place] - [place] - [place] - [place] - [name]   Follow-up Note  Referring Provider: Janeece Agee, NP Primary Provider: Lula Olszewski, MD Date of Office Visit: 05/06/2022  Subjective:   Pamela Waller (DOB: 1965-09-03) is a 57 y.o. female who [facility] in re-evaluation of the following:  HPI: Pamela Waller returns to this clinic in evaluation of hypogammaglobulinemia/CVID, reflux, tobacco smoking, sicca syndrome.  I last saw her in this clinic 29 October 2021.  She is doing very well with her Xembify infusions other than the fact that she sometimes gets a knot that takes a month to resolve but otherwise no other significant side effects.  Her infusions have allowed her to go through the past year without any infections without the need for any systemic steroids or antibiotics.  She has been having some problems with voice fatigue as the day moves forward and a lot of throat clearing.  She does not have that much classic reflux symptoms but she does have a history of LPR.  She is not treating her LPR with either proton pump inhibitor or H2 receptor blocker.  She has 1 cup of coffee in the morning and no other sources of caffeine and she does not drink any alcohol.  She does smoke and currently she is smoking about 4 cigarettes/day and she is also vaping.  We referred her onto rheumatology regarding her sicca syndrome and at this point time there is nothing to move forward to have her use any medications to address this issue other than Biotene mouthwash and wetting solutions for her eyes.  As expected given her CVID, she did not have any antibodies consistent with sicca syndrome.  Allergies as of 05/06/2022       Reactions   Dilaudid [hydromorphone Hcl] Anaphylaxis   Morphine And Related Anaphylaxis   Iodinated Contrast Media Hives   IVP dye.    Codeine Other (See Comments)   Other Hives   Penicillins Hives, Other (See Comments)    Percocet [oxycodone-acetaminophen] Nausea Only   Prednisone    psychosis   Prozac [fluoxetine Hcl]         Medication List    cholecalciferol 25 MCG (1000 UNIT) tablet Commonly known as: VITAMIN D3 Take 1 tablet (1,000 Units total) by mouth daily.   clonazePAM 0.5 MG tablet Commonly known as: KlonoPIN Take 1 tablet by mouth 3 times a day as needed   cyanocobalamin 1000 MCG/ML injection Commonly known as: VITAMIN B12 1000 mcg (1 mg) injection once per week for four weeks, followed by 1000 mcg injection once per month.   diphenhydrAMINE 12.5 MG/5ML elixir Commonly known as: BENADRYL Take by mouth every 14 (fourteen) days. Take   EPINEPHrine 0.3 mg/0.3 mL Soaj injection Commonly known as: EPI-PEN Inject into the muscle.   hydrochlorothiazide 25 MG tablet Commonly known as: HYDRODIURIL Take 1 tablet (25 mg total) by mouth daily.   ketorolac 10 MG tablet Commonly known as: TORADOL Take 1 tablet (10 mg total) by mouth every 6 (six) hours as needed.   losartan 25 MG tablet Commonly known as: COZAAR Take 1 tablet (25 mg total) by mouth daily. Take just half tablet daily for first month   meclizine 25 MG tablet Commonly known as: ANTIVERT Take 1 tablet (25 mg total) by mouth 3 (three) times daily as needed for dizziness.   metFORMIN 500 MG tablet Commonly known as: GLUCOPHAGE Take 1 tablet (500 mg total) by mouth  2 (two) times daily with a meal. Start at 1 tablet daily for first week Do not start within 1 week of another medicine being started   propranolol ER 60 MG 24 hr capsule Commonly known as: INDERAL LA TAKE 1 CAPSULE BY MOUTH ONCE DAILY   rosuvastatin 20 MG tablet Commonly known as: CRESTOR TAKE 1 TABLET BY MOUTH EVERY DAY   sertraline 100 MG tablet Commonly known as: Zoloft Take 2 tablets by mouth daily   topiramate 25 MG tablet Commonly known as: Topamax Take 1 tablet (25 mg total) by mouth 2 (two) times daily. To start.  Just 25 mg each mornings  for first week.  Increases heatstroke risk. Stop if pregnant.  Seek immediate medical attention if vision changes.   Xembify 10 GM/50ML Soln Generic drug: Immune Globulin (Human)-klhw Inject 20 g into the skin every 14 (fourteen) days.    Past Medical History:  Diagnosis Date  . Allergy   . Chronic sinusitis 12/23/2018  . Degenerative disc disease, cervical 11/07/2021   MRI proven 3 bulging disks with nerve pinches most notably on the left side   . Depression   . Endometriosis   . Fatty liver 11/07/2021  . GERD (gastroesophageal reflux disease)   . Hyperlipidemia   . Hypertension   . Hypogammaglobulinemia    sub class 1 and 3 deficiency  . Lumbar degenerative disc disease 11/07/2021  . Palmar erythema 11/07/2021  . Panic attacks   . Sinusitis   . Vitamin B12 deficiency 12/23/2018  . Vitamin D deficiency 12/23/2018    Past Surgical History:  Procedure Laterality Date  . ABDOMINAL HYSTERECTOMY    . BLADDER SURGERY    . BREAST LUMPECTOMY    . CESAREAN SECTION    . KNEE SURGERY    . NASAL SEPTUM SURGERY    . SINUSOTOMY      Review of systems negative except as noted in HPI / PMHx or noted below:  Review of Systems  Constitutional: Negative.   HENT: Negative.    Eyes: Negative.   Respiratory: Negative.    Cardiovascular: Negative.   Gastrointestinal: Negative.   Genitourinary: Negative.   Musculoskeletal: Negative.   Skin: Negative.   Neurological: Negative.   Endo/Heme/Allergies: Negative.   Psychiatric/Behavioral: Negative.       Objective:   Vitals:   05/06/22 1332  BP: 128/78  Pulse: (!) 58  Resp: 16  Temp: (!) 97.3 F (36.3 C)  SpO2: 97%   Height: 5' (152.4 cm)  Weight: 159 lb 6.4 oz (72.3 kg)   Physical Exam Constitutional:      Appearance: She is not diaphoretic.  HENT:     Head: Normocephalic.     Right Ear: Tympanic membrane, ear canal and external ear normal.     Left Ear: Tympanic membrane, ear canal and external ear normal.      Nose: Nose normal. No mucosal edema or rhinorrhea.     Mouth/Throat:     Pharynx: Uvula midline. No oropharyngeal exudate.  Eyes:     Conjunctiva/sclera: Conjunctivae normal.  Neck:     Thyroid: No thyromegaly.     Trachea: Trachea normal. No tracheal tenderness or tracheal deviation.  Cardiovascular:     Rate and Rhythm: Normal rate and regular rhythm.     Heart sounds: Normal heart sounds, S1 normal and S2 normal. No murmur heard. Pulmonary:     Effort: No respiratory distress.     Breath sounds: Normal breath sounds. No stridor. No wheezing or rales.  Lymphadenopathy:     Head:     Right side of head: No tonsillar adenopathy.     Left side of head: No tonsillar adenopathy.     Cervical: No cervical adenopathy.  Skin:    Findings: No erythema or rash.     Nails: There is no clubbing.  Neurological:     Mental Status: She is alert.    Diagnostics:   Results of blood tests obtained 29 October 2021 identifies IgG 865 Mg/DL, IgA 161 mg/DL, IgM 87 MGs/DL  Assessment and Plan:   1. CVID (common variable immunodeficiency)   2. Hypogammaglobulinemia   3. Other chronic sinusitis   4. Light tobacco smoker <10 cigarettes per day   5. Sjogren's syndrome with keratoconjunctivitis sicca   6. LPRD (laryngopharyngeal reflux disease)    1. Continue Xembify infusions  2. Treat reflux/LPR:   A. Omeprazole 40 mg in AM  B. Famotidine 40 mg in PM  C. Evaluation with ENT???  D. Contact clinic with update in 6 weeks  3. If needed:   A. Nasal saline wash  B. Loratadine 10 mg - 1-2 tablet 1 time per day (Dry mouth???)  C. Nystatin - 5 mls swish and swallow 2 times a day if thrush develops  D.  Biotene mouthwash  E.  Systane / Refresh eyedrops  4.  Return to clinic in 6 months or earlier if problem  Pamela Waller is doing very well with her immunoglobulin infusions and she will continue to use this form of therapy as it is resulted in very good control of her recurrent respiratory tract  symptoms.  I have some concern about her throat symptoms and we will treat her aggressively for LPR for 6 weeks and if her issue does not resolve then we need to have ENT take a look at her throat for she is a tobacco smoker and has another risk factor for developing something other than just LPR.  If she completely clears up with using omeprazole and famotidine regarding her throat symptoms then this is LPR and then she can make a decision about how to address that issue.  She has a collection of other agents to be utilized should they be needed.  If she does well I will see her back in this clinic in 6 months or earlier if there is a problem.  Laurette Schimke, MD Allergy / Immunology [facility]

## 2022-05-06 NOTE — Patient Instructions (Addendum)
  1. Continue Xembify infusions  2. Treat reflux/LPR:   A. Omeprazole 40 mg in AM  B. Famotidine 40 mg in PM  C. Evaluation with ENT???  D. Contact clinic with update in 6 weeks  3. If needed:   A. Nasal saline wash  B. Loratadine 10 mg - 1-2 tablet 1 time per day (Dry mouth???)  C. Nystatin - 5 mls swish and swallow 2 times a day if thrush develops  D.  Biotene mouthwash  E.  Systane / Refresh eyedrops  4.  Return to clinic in 6 months or earlier if problem

## 2022-05-07 ENCOUNTER — Encounter: Payer: Self-pay | Admitting: Allergy and Immunology

## 2022-05-09 ENCOUNTER — Encounter: Payer: Self-pay | Admitting: Internal Medicine

## 2022-05-09 DIAGNOSIS — K219 Gastro-esophageal reflux disease without esophagitis: Secondary | ICD-10-CM | POA: Insufficient documentation

## 2022-05-09 DIAGNOSIS — D839 Common variable immunodeficiency, unspecified: Secondary | ICD-10-CM | POA: Insufficient documentation

## 2022-05-13 ENCOUNTER — Ambulatory Visit: Admitting: Neurology

## 2022-05-13 ENCOUNTER — Encounter: Payer: Self-pay | Admitting: Neurology

## 2022-05-13 VITALS — BP 134/89 | HR 56 | Ht 60.0 in | Wt 162.4 lb

## 2022-05-13 DIAGNOSIS — G43009 Migraine without aura, not intractable, without status migrainosus: Secondary | ICD-10-CM

## 2022-05-13 DIAGNOSIS — E538 Deficiency of other specified B group vitamins: Secondary | ICD-10-CM

## 2022-05-13 DIAGNOSIS — G4719 Other hypersomnia: Secondary | ICD-10-CM | POA: Diagnosis not present

## 2022-05-13 DIAGNOSIS — R0683 Snoring: Secondary | ICD-10-CM | POA: Diagnosis not present

## 2022-05-13 DIAGNOSIS — R519 Headache, unspecified: Secondary | ICD-10-CM

## 2022-05-13 DIAGNOSIS — G63 Polyneuropathy in diseases classified elsewhere: Secondary | ICD-10-CM

## 2022-05-13 NOTE — Progress Notes (Signed)
[facility]    Provider:  [name] Requesting Provider: Ellis Savage, NP Primary Care Provider:  Lula Olszewski, MD  CC:  migraines  Interval update May 13, 2022: I saw patient back in 2022 as referred by Misty Stanley Poulosfor migraines.  Past medical history B12 deficiency, Sjogren's, she sees Dr. [name] at [facility].  She had a history of chronic B12 deficiency which she has been instructed to treat by [place] but she had not continued with the plan.  She sent back to [place] because she wants to address pernicious anemia affecting her nerves, peripheral neuropathy, for pernicious anemia itself I would send back to [name] or GI however she does need B12 supplementation absolutely which is what we recommended at the time for life.  We scheduled her for a 41-month appointment which she canceled.  BUT since she was referred her physician has luckily tested b12, put her on B12 supplementation (injections) just recently and so we will have to wait and see how that helps her neuropathy before doing anything. Her pcp also just started her on Topamax and is going to slowly increase and see if that works for migraines. BUT also she needs a sleep study. She has morning headaches, excessive fatgue, weight gain, snores like crazy will send for sleep testing.   Patient complains of symptoms per HPI as well as the following symptoms: needs sleep stdy . Pertinent negatives and positives per HPI. All others negative   07/31/2020: reviewed images and agree: CLINICAL DATA:  Brain fall. Headaches and visual changes. Gait changes and T   EXAM: MRI HEAD WITHOUT CONTRAST   TECHNIQUE: Multiplanar, multiecho pulse sequences of the brain and surrounding structures were obtained without intravenous contrast.   COMPARISON:  None.   FINDINGS: Brain: No white matter disease, infarction, hemorrhage, hydrocephalus, extra-axial collection or mass lesion. Normal brain volume   Vascular:  Normal flow voids.   Skull and upper cervical spine: Normal marrow signal.   Sinuses/Orbits: Overall mild patchy mucosal thickening in paranasal sinuses, chronic based on a 2020 maxillofacial CT.   IMPRESSION: Normal MRI of the brain.  Patient complains of symptoms per HPI as well as the following symptoms: sleep test . Pertinent negatives and positives per HPI. All others negative   HPI 08/07/2020:  Pamela Waller is a 57 y.o. female here as requested by Ellis Savage, NP for migraines. PMHx chronic b12 deficiency,    She is referred by Ellis Savage.  I reviewed Dr. 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  She feels migraines are caused by stress. She also has numbness/tingling in the hands and feet and brain fog. She has a loss for words. She cognitively feels like crap. Her muscles are so fatigued. She has floaters in her eyes, she has not been to an eye doctor.She has problems with speech, loss of work, she has to keep her GPS on to get home from work, she loses rack of thoughts and drives past her exit, could be stress as well. She snores heavily an has excessive fatigues, she wakes up with headaches in the morning "all the time". She is on FMLA until the 15th of August so she can get everything completed. She had an EMG  in the past which was normal. She has tension headaches, bitemporal, pressure, no migrainous symptoms (nor pulsating, no light sensitivity, or nausea). Headaches can be severe. She is very irritable. She has mood difficulties.   Reviewed notes, labs and imaging from outside physicians, which showed:   MRI: normal, personally reviewed images and agree  TSH 07/12/2020 normal B12 176 Hgba1c 5.3 12/27/2019  Review of  Systems: Patient complains of symptoms per HPI as well as the following symptoms: depression. Pertinent negatives and positives per HPI. All others negative.   Social History   Socioeconomic History   Marital status: Significant Other    Spouse name: Not on file   Number of children: Not on file   Years of education: Not on file   Highest education level: Not on file  Occupational History   Not on file  Tobacco Use   Smoking status: Every Day    Packs/day: 0.75    Years: 28.00    Additional pack years: 0.00    Total pack years: 21.00    Types: Cigarettes    Passive exposure: Current   Smokeless tobacco: Never   Tobacco comments:    Started cigarettes age 58 cut down to half pk age 78. Per Dr. Lucie Leather she she start vaping instead, started dec 2023 and vapes 4 times a day  Vaping Use   Vaping Use: Not on file  Substance and Sexual Activity   Alcohol use: Not Currently   Drug use: Never   Sexual activity: Not on file  Other Topics Concern   Not on file  Social History Narrative   Not on file   Social Determinants of Health   Financial Resource Strain: Not on file  Food Insecurity: Not on file  Transportation Needs: Not on file  Physical Activity: Not on file  Stress: Not on file  Social Connections: Not on file  Intimate Partner Violence: Not on file    Family History  Problem Relation Age of Onset   Asthma Mother    Arthritis Mother    Breast cancer Mother    Pulmonary fibrosis Father    Hyperlipidemia Father    Hypertension Father    Sleep apnea Father    Breast cancer Sister    Stomach cancer Neg Hx    Colon cancer Neg Hx    Pancreatic cancer Neg Hx    Liver disease Neg Hx    Esophageal cancer Neg Hx     Past Medical History:  Diagnosis Date   Allergy    Chronic sinusitis 12/23/2018   Degenerative disc disease, cervical 11/07/2021   MRI proven 3 bulging disks with nerve pinches most notably on the left side    Depression    Endometriosis    Fatty  liver 11/07/2021   GERD (gastroesophageal reflux disease)    Hyperlipidemia    Hypertension    Hypogammaglobulinemia    sub class 1 and 3 deficiency   Lumbar degenerative disc disease 11/07/2021   Palmar erythema 11/07/2021   Panic attacks    Sinusitis    Vitamin B12 deficiency 12/23/2018   Vitamin D deficiency 12/23/2018    Patient Active Problem List   Diagnosis Date Noted   CVID (common variable immunodeficiency) 05/09/2022   Laryngopharyngeal reflux (LPR) 05/09/2022   Obesity due to energy imbalance 05/02/2022   Myopathy 05/02/2022   Bradycardia 02/07/2022   OSA (obstructive sleep apnea) 02/07/2022   Vertigo 02/07/2022   Long COVID 02/07/2022   Pleuritic pain 12/02/2021  Palmar erythema 11/07/2021   Fatty liver 11/07/2021   Degenerative disc disease, cervical 11/07/2021   Lumbar degenerative disc disease 11/07/2021   Dry cough 07/17/2021   Family history of autoimmune disorder 07/17/2021   Dry mouth and eyes 07/17/2021   Memory changes 07/14/2020   Abnormal mammogram 05/17/2020   Bereavement, uncomplicated 05/17/2020   Fibroadenosis of breast 05/17/2020   Stress incontinence 05/17/2020   Tobacco use disorder 05/17/2020   Upper back pain on left side 12/27/2019   Hypersomnolence 12/25/2018   Chronic sinusitis 12/23/2018   Vitamin D deficiency 12/23/2018   B12 deficiency 12/23/2018   Sialadenitis 02/09/2018   Generalized anxiety disorder with panic attacks 02/05/2018   Raised TSH level 10/09/2016   Thrombocytopenic disorder 10/06/2016   Snoring 10/06/2016   Sinusitis 10/06/2016   Disorder of vision 09/23/2016   Neuropathy 09/23/2016   Insomnia 09/23/2016   Allergic rhinitis due to pollen 07/11/2016   Depressive disorder 07/11/2016   Gastritis 07/11/2016   Hypertension 06/26/2015   Gastroesophageal reflux disease without esophagitis 02/08/2015   Idiopathic peripheral neuropathy 02/08/2015   Recurrent major depressive disorder, in partial remission  02/08/2015   Myalgia 01/27/2013   Nicotine dependence 12/02/2012   BRCA2 gene mutation positive 03/11/2011   Fatigue 12/04/2009   Hyperlipidemia LDL goal <100 12/04/2009   Hypogammaglobulinemia 12/04/2009    Past Surgical History:  Procedure Laterality Date   ABDOMINAL HYSTERECTOMY     BLADDER SURGERY     BREAST LUMPECTOMY     CESAREAN SECTION     KNEE SURGERY     NASAL SEPTUM SURGERY     SINUSOTOMY      Current Outpatient Medications  Medication Sig Dispense Refill   antiseptic oral rinse (BIOTENE) LIQD 15 mLs by Mouth Rinse route as needed for dry mouth. 1000 mL 1   cholecalciferol (VITAMIN D3) 25 MCG (1000 UNIT) tablet Take 1 tablet (1,000 Units total) by mouth daily. 90 tablet 3   clonazePAM (KLONOPIN) 0.5 MG tablet Take 1 tablet by mouth 3 times a day as needed 90 tablet 5   cyanocobalamin (VITAMIN B12) 1000 MCG/ML injection 1000 mcg (1 mg) injection once per week for four weeks, followed by 1000 mcg injection once per month. 1 mL 15   diphenhydrAMINE (BENADRYL) 12.5 MG/5ML elixir Take by mouth every 14 (fourteen) days. Take     EPINEPHrine 0.3 mg/0.3 mL IJ SOAJ injection Inject into the muscle.     famotidine (PEPCID) 40 MG tablet Take 1 tablet (40 mg total) by mouth at bedtime. 90 tablet 1   Immune Globulin, Human,-klhw (XEMBIFY) 10 GM/50ML SOLN Inject 20 g into the skin every 14 (fourteen) days. 200 mL 11   loratadine (CLARITIN) 10 MG tablet Take 1 tablet (10 mg total) by mouth 2 (two) times daily. 180 tablet 1   losartan (COZAAR) 25 MG tablet Take 1 tablet (25 mg total) by mouth daily. Take just half tablet daily for first month 90 tablet 3   metFORMIN (GLUCOPHAGE) 500 MG tablet Take 1 tablet (500 mg total) by mouth 2 (two) times daily with a meal. Start at 1 tablet daily for first week Do not start within 1 week of another medicine being started 180 tablet 3   nystatin (MYCOSTATIN) 100000 UNIT/ML suspension Take 5 mLs (500,000 Units total) by mouth 2 (two) times daily. 473  mL 1   propranolol ER (INDERAL LA) 60 MG 24 hr capsule TAKE 1 CAPSULE BY MOUTH ONCE DAILY 90 capsule 0   Propylene Glycol (SYSTANE COMPLETE)  0.6 % SOLN Apply 1 drop to eye 4 (four) times daily as needed. 45 mL 1   rosuvastatin (CRESTOR) 20 MG tablet TAKE 1 TABLET BY MOUTH EVERY DAY 90 tablet 0   sertraline (ZOLOFT) 100 MG tablet Take 2 tablets by mouth daily 180 tablet 1   topiramate (TOPAMAX) 25 MG tablet Take 1 tablet (25 mg total) by mouth 2 (two) times daily. To start.  Just 25 mg each mornings for first week.  Increases heatstroke risk. Stop if pregnant.  Seek immediate medical attention if vision changes. 180 tablet 3   No current facility-administered medications for this visit.    Allergies as of 05/13/2022 - Review Complete 05/13/2022  Allergen Reaction Noted   Dilaudid [hydromorphone hcl] Anaphylaxis 09/09/2018   Morphine and related Anaphylaxis 09/09/2018   Iodinated contrast media Hives 05/02/2011   Codeine Other (See Comments) 01/17/2003   Other Hives 10/29/2021   Penicillins Hives and Other (See Comments) 01/17/2003   Percocet [oxycodone-acetaminophen] Nausea Only 12/16/2019   Prednisone  09/09/2018   Prozac [fluoxetine hcl]  09/09/2018    Vitals: BP 134/89   Pulse (!) 56   Ht 5' (1.524 m)   Wt 162 lb 6.4 oz (73.7 kg)   BMI 31.72 kg/m  Last Weight:  Wt Readings from Last 1 Encounters:  05/13/22 162 lb 6.4 oz (73.7 kg)   Last Height:   Ht Readings from Last 1 Encounters:  05/13/22 5' (1.524 m)   Exam: NAD, pleasant                  Speech:    Speech is normal; fluent and spontaneous with normal comprehension.  Cognition:    The patient is oriented to person, place, and time;     recent and remote memory intact;     language fluent;    Cranial Nerves:    The pupils are equal, round, and reactive to light.Trigeminal sensation is intact and the muscles of mastication are normal. The face is symmetric. The palate elevates in the midline. Hearing intact. Voice  is normal. Shoulder shrug is normal. The tongue has normal motion without fasciculations.   Coordination:  No dysmetria  Motor Observation:    No asymmetry, no atrophy, and no involuntary movements noted. Tone:    Normal muscle tone.     Strength:    Strength is V/V in the upper and lower limbs.      Sensation: intact to LT       Assessment/Plan: Interval update May 13, 2022: I saw patient back in 2022 as referred by Ellis Savage for migraines.  Past medical history B12 deficiency, Sjogren's, she sees Dr. Ellis [facility] at [facility].  She had a history of chronic B12 deficiency which she has been instructed to treat but she didnt.   In 2022 We gave her an injection and informed her that she would have to take oral supplement or injections and follow-up with primary care, we scheduled her for a 90-month appointment which she canceled.  - BUT since she was referred to [place] her physician has luckily tested b12, put her on B12 supplementation (injections) just recently and will have to wait and see if it helps with her neuropathy f/u 6 months and  see how that helps her neuropathy before doing anything.  - Her pcp also just started her on Topamax and is going to slowly increase and see if that works for migraines.  - BUT also she needs a sleep study,  been referred by Dr. Lance Coon. She has morning headaches, excessive fatgue, weight gain, snores like crazy will send for sleep testing. Had a sleep test years ago > 7 years.  Discussed with [name] said ok to see. Went and made her an appointment, discussed with sleep team, she will be seen this Thursday at 10:15 - ESS 10   Orders Placed This Encounter  Procedures   Ambulatory referral to Sleep Studies      Cc: Ellis Savage, NP,  Lula Olszewski, MD  Naomie Dean, MD  [facility] [address] [place], [place] [address]  Phone [phone] Fax [phone]  I spent over 50 minutes of  face-to-face and non-face-to-face time with patient on the  1. Vitamin B12 deficiency neuropathy   2. Snoring   3. Morning headache   4. Excessive daytime sleepiness   5. Migraine without aura and without status migrainosus, not intractable    diagnosis.  This included previsit chart review, lab review, study review, order entry, electronic health record documentation, patient education on the different diagnostic and therapeutic options, counseling and coordination of care, risks and benefits of management, compliance, or risk factor reduction

## 2022-05-15 ENCOUNTER — Institutional Professional Consult (permissible substitution): Admitting: Neurology

## 2022-05-29 ENCOUNTER — Other Ambulatory Visit: Payer: Self-pay | Admitting: Internal Medicine

## 2022-05-29 DIAGNOSIS — I10 Essential (primary) hypertension: Secondary | ICD-10-CM

## 2022-05-29 MED ORDER — PROPRANOLOL HCL ER 60 MG PO CP24
ORAL_CAPSULE | Freq: Every day | ORAL | 0 refills | Status: DC
Start: 2022-05-29 — End: 2022-08-14

## 2022-07-10 ENCOUNTER — Other Ambulatory Visit: Payer: Self-pay | Admitting: Internal Medicine

## 2022-07-10 DIAGNOSIS — E785 Hyperlipidemia, unspecified: Secondary | ICD-10-CM

## 2022-08-13 ENCOUNTER — Other Ambulatory Visit: Payer: Self-pay | Admitting: Internal Medicine

## 2022-08-13 DIAGNOSIS — I10 Essential (primary) hypertension: Secondary | ICD-10-CM

## 2022-08-21 ENCOUNTER — Ambulatory Visit: Admitting: Internal Medicine

## 2022-08-26 ENCOUNTER — Ambulatory Visit: Admitting: Internal Medicine

## 2022-08-26 ENCOUNTER — Encounter: Payer: Self-pay | Admitting: Internal Medicine

## 2022-08-26 VITALS — BP 104/80 | HR 71 | Temp 98.3°F | Ht 60.0 in | Wt 162.4 lb

## 2022-08-26 DIAGNOSIS — R001 Bradycardia, unspecified: Secondary | ICD-10-CM

## 2022-08-26 DIAGNOSIS — R42 Dizziness and giddiness: Secondary | ICD-10-CM

## 2022-08-26 DIAGNOSIS — K219 Gastro-esophageal reflux disease without esophagitis: Secondary | ICD-10-CM

## 2022-08-26 DIAGNOSIS — R0683 Snoring: Secondary | ICD-10-CM

## 2022-08-26 DIAGNOSIS — G9332 Myalgic encephalomyelitis/chronic fatigue syndrome: Secondary | ICD-10-CM

## 2022-08-26 DIAGNOSIS — E663 Overweight: Secondary | ICD-10-CM | POA: Diagnosis not present

## 2022-08-26 DIAGNOSIS — E559 Vitamin D deficiency, unspecified: Secondary | ICD-10-CM

## 2022-08-26 DIAGNOSIS — G629 Polyneuropathy, unspecified: Secondary | ICD-10-CM

## 2022-08-26 DIAGNOSIS — G4733 Obstructive sleep apnea (adult) (pediatric): Secondary | ICD-10-CM

## 2022-08-26 DIAGNOSIS — H04123 Dry eye syndrome of bilateral lacrimal glands: Secondary | ICD-10-CM

## 2022-08-26 DIAGNOSIS — I10 Essential (primary) hypertension: Secondary | ICD-10-CM | POA: Diagnosis not present

## 2022-08-26 DIAGNOSIS — E669 Obesity, unspecified: Secondary | ICD-10-CM

## 2022-08-26 DIAGNOSIS — H539 Unspecified visual disturbance: Secondary | ICD-10-CM

## 2022-08-26 DIAGNOSIS — R2240 Localized swelling, mass and lump, unspecified lower limb: Secondary | ICD-10-CM

## 2022-08-26 DIAGNOSIS — F17210 Nicotine dependence, cigarettes, uncomplicated: Secondary | ICD-10-CM

## 2022-08-26 DIAGNOSIS — G4710 Hypersomnia, unspecified: Secondary | ICD-10-CM

## 2022-08-26 DIAGNOSIS — E785 Hyperlipidemia, unspecified: Secondary | ICD-10-CM | POA: Diagnosis not present

## 2022-08-26 DIAGNOSIS — G609 Hereditary and idiopathic neuropathy, unspecified: Secondary | ICD-10-CM

## 2022-08-26 DIAGNOSIS — D696 Thrombocytopenia, unspecified: Secondary | ICD-10-CM

## 2022-08-26 DIAGNOSIS — F3341 Major depressive disorder, recurrent, in partial remission: Secondary | ICD-10-CM

## 2022-08-26 DIAGNOSIS — R682 Dry mouth, unspecified: Secondary | ICD-10-CM

## 2022-08-26 LAB — COMPREHENSIVE METABOLIC PANEL
ALT: 10 U/L (ref 0–35)
AST: 17 U/L (ref 0–37)
Albumin: 4.9 g/dL (ref 3.5–5.2)
Alkaline Phosphatase: 57 U/L (ref 39–117)
BUN: 15 mg/dL (ref 6–23)
CO2: 26 mEq/L (ref 19–32)
Calcium: 10.5 mg/dL (ref 8.4–10.5)
Chloride: 107 mEq/L (ref 96–112)
Creatinine, Ser: 0.74 mg/dL (ref 0.40–1.20)
GFR: 89.96 mL/min (ref >60.00)
Glucose, Bld: 91 mg/dL (ref 70–99)
Potassium: 4.4 mEq/L (ref 3.5–5.1)
Sodium: 143 mEq/L (ref 135–145)
Total Bilirubin: 0.6 mg/dL (ref 0.2–1.2)
Total Protein: 7.5 g/dL (ref 6.0–8.3)

## 2022-08-26 LAB — MICROALBUMIN / CREATININE URINE RATIO
Creatinine,U: 44 mg/dL
Microalb Creat Ratio: 1.6 mg/g (ref 0.0–30.0)
Microalb, Ur: <0.7 mg/dL (ref 0.0–1.9)

## 2022-08-26 LAB — LIPID PANEL
Cholesterol: 175 mg/dL (ref 0–200)
HDL: 55.5 mg/dL (ref >39.00)
LDL Cholesterol: 94 mg/dL (ref 0–99)
NonHDL: 119.49
Total CHOL/HDL Ratio: 3
Triglycerides: 128 mg/dL (ref 0.0–149.0)
VLDL: 25.6 mg/dL (ref 0.0–40.0)

## 2022-08-26 LAB — CBC WITH DIFFERENTIAL/PLATELET
Basophils Absolute: 0 10*3/uL (ref 0.0–0.1)
Basophils Relative: 0.5 % (ref 0.0–3.0)
Eosinophils Absolute: 0.3 10*3/uL (ref 0.0–0.7)
Eosinophils Relative: 4 % (ref 0.0–5.0)
HCT: 42.8 % (ref 36.0–46.0)
Hemoglobin: 14 g/dL (ref 12.0–15.0)
Lymphocytes Relative: 26.4 % (ref 12.0–46.0)
Lymphs Abs: 1.8 10*3/uL (ref 0.7–4.0)
MCHC: 32.7 g/dL (ref 30.0–36.0)
MCV: 90 fl (ref 78.0–100.0)
Monocytes Absolute: 0.4 10*3/uL (ref 0.1–1.0)
Monocytes Relative: 5.2 % (ref 3.0–12.0)
Neutro Abs: 4.4 10*3/uL (ref 1.4–7.7)
Neutrophils Relative %: 63.9 % (ref 43.0–77.0)
Platelets: 122 10*3/uL — ABNORMAL LOW (ref 150.0–400.0)
RBC: 4.76 Mil/uL (ref 3.87–5.11)
RDW: 13.6 % (ref 11.5–15.5)
WBC: 6.9 10*3/uL (ref 4.0–10.5)

## 2022-08-26 LAB — VITAMIN D 25 HYDROXY (VIT D DEFICIENCY, FRACTURES): VITD: 46.69 ng/mL (ref 30.00–100.00)

## 2022-08-26 LAB — B12 AND FOLATE PANEL
Folate: 7.1 ng/mL (ref >5.9)
Vitamin B-12: 502 pg/mL (ref 211–911)

## 2022-08-26 LAB — HEMOGLOBIN A1C: Hgb A1c MFr Bld: 5.4 % (ref 4.6–6.5)

## 2022-08-26 MED ORDER — PEPCID COMPLETE 10-800-165 MG PO CHEW: 1.0000 | CHEWABLE_TABLET | Freq: Two times a day (BID) | ORAL | 11 refills | Status: DC | PRN

## 2022-08-26 MED ORDER — PROPRANOLOL HCL ER 60 MG PO CP24
60.0000 mg | ORAL_CAPSULE | Freq: Every day | ORAL | 3 refills | Status: DC
Start: 2022-08-26 — End: 2023-04-14

## 2022-08-26 MED ORDER — FAMOTIDINE 40 MG PO TABS: 40.0000 mg | ORAL_TABLET | Freq: Every evening | ORAL | 1 refills | Status: DC

## 2022-08-26 MED ORDER — ROSUVASTATIN CALCIUM 20 MG PO TABS
20.0000 mg | ORAL_TABLET | Freq: Every day | ORAL | 3 refills | Status: AC
Start: 2022-08-26 — End: ?

## 2022-08-26 MED ORDER — VITAMIN D 25 MCG (1000 UNIT) PO TABS: 1000.0000 [IU] | ORAL_TABLET | Freq: Every day | ORAL | 3 refills | Status: AC

## 2022-08-26 MED ORDER — HYDROCHLOROTHIAZIDE 12.5 MG PO TABS: 12.5000 mg | ORAL_TABLET | Freq: Every day | ORAL | 3 refills | Status: DC

## 2022-08-26 NOTE — Assessment & Plan Note (Signed)
Uncontrolled lately Refer for vestibular rehabilitation Consider ENT evaluation if symptoms persist

## 2022-08-26 NOTE — Assessment & Plan Note (Addendum)
Prescribe Pepcid Complete for as-needed use Refill famotidine 40mg  daily at bedtime Discuss lifestyle modifications (elevated head of bed, avoid eating before bedtime)

## 2022-08-26 NOTE — Assessment & Plan Note (Signed)
Continue current statin therapy Order lipid panel for follow-up Discuss diet and lifestyle modifications

## 2022-08-26 NOTE — Assessment & Plan Note (Signed)
Continue sertraline 200mg  daily Monitor for side effects and efficacy Assess PHQ-9 score at follow-up visits

## 2022-08-26 NOTE — Assessment & Plan Note (Signed)
Order sleep study to evaluate for sleep apnea Encourage use of sleep tracker app Discuss sleep hygiene measures Consider referral to sleep specialist if study is positive

## 2022-08-26 NOTE — Assessment & Plan Note (Signed)
Discuss smoking cessation strategies Offer nicotine replacement therapy or other smoking cessation aids Refer to smoking cessation program if patient is interested Order lung cancer screening

## 2022-08-26 NOTE — Assessment & Plan Note (Signed)
Monitor heart rate at home Assess for symptoms (dizziness, fatigue, shortness of breath) Consider adjustment of beta-blocker dose if symptomatic

## 2022-08-26 NOTE — Assessment & Plan Note (Signed)
Order complete blood count to monitor platelet levels Monitor for signs of bleeding or bruising Consider referral to hematology if platelet count drops further Suspicious for liver related but also autoimmune issues.

## 2022-08-26 NOTE — Patient Instructions (Addendum)
##   Patient Instructions (AVS)  1. Continue propranolol LA 60mg  daily and hydrochlorothiazide 12.5mg  daily for blood pressure. Stop taking Cozaar. 2. Use Pepcid Complete as needed for heartburn. Take famotidine as prescribed. 3. Continue sertraline 200mg  daily for depression. 4. Use artificial tears and saliva substitutes for dry eyes and mouth. 5. Monitor blood pressure at home daily. Target is below 140/90. 6. Increase salt intake slightly to help with low blood pressure symptoms. 6. Schedule sleep study as ordered. 7. Schedule ultrasound for leg mass evaluation. 8. Follow up with immunologist regarding resuming immunoglobulin therapy. 9. Schedule lung cancer screening. 10. Complete ordered blood tests before next visit. 11. Follow up in 4-6 weeks to review test results and medication changes.  VISIT SUMMARY:  During our appointment, we discussed your complex medical history and the various concerns you have. These include side effects from your medications, suspected sleep apnea, fluctuations in your blood pressure, neuropathy, and unexplained physical symptoms. You also expressed a desire to improve your overall health and manage your symptoms better.  YOUR PLAN:  -HYPERTENSION: Hypertension is high blood pressure. We will continue your current medication regimen, stop Cozaar, and aim for a blood pressure goal of 140/90.  -HYPERLIPIDEMIA: Hyperlipidemia is high cholesterol. We will continue with Crestor and check your cholesterol levels today.  -GERD: GERD is a condition where stomach acid frequently flows back into the tube connecting your mouth and stomach. We will refill your famotidine and consider Pepcid Complete for as-needed use.  -CHRONIC SINUSITIS: Chronic sinusitis is a condition characterized by inflammation of the sinuses. We will encourage you to quit smoking, which can contribute to this condition.  -NICOTINE DEPENDENCE: Nicotine dependence is an addiction to tobacco  products. We will discuss strategies to help you quit smoking, which can improve your sinusitis and potentially regulate your blood pressure.  -NEUROPATHY: Neuropathy is nerve damage that can cause weakness, numbness, and pain. We will consider trying Lyrica and refer you to vestibular rehab for vertigo.  -SLEEP APNEA: Sleep apnea is a serious sleep disorder that occurs when a person's breathing is interrupted during sleep. We will encourage the use of a sleep tracker and consider a repeat sleep study.  -SUBCUTANEOUS IMMUNOGLOBULIN THERAPY: This is a treatment for immune disorders. We will continue the current regimen and consider a consultation with an immunologist for administration issues.  -LEG MASS: This is a lump in your leg, possibly related to your Zembify administration. We will monitor this for one month and consider an ultrasound if it does not resolve.  -HEMANGIOMAS: Hemangiomas are bright red birthmarks that show up at birth or in the first or second week of life. We will continue to monitor the red spots on your stomach and chest.  -GENERAL HEALTH MAINTENANCE: We will check your vitamin D and B12 levels today, order a lung cancer screening, and follow up in one month.  INSTRUCTIONS:  Please continue to take your medications as prescribed. Monitor your blood pressure regularly and report any significant changes. Try to use a sleep tracker to monitor your sleep patterns. We will check your cholesterol, vitamin D, and B12 levels today. We will also order a lung cancer screening. Please return for a follow-up visit in one month.   Remember to take all medications as prescribed and report any new or worsening symptoms. Call the office if you experience severe side effects or have any concerns.

## 2022-08-26 NOTE — Progress Notes (Signed)
[facility]: [phone]   Routine Medical Office Visit  Patient:  Pamela Waller      Age: 57 y.o.       Sex:  female  Date:   08/26/2022 Patient Care Team: Lula Olszewski, MD as PCP - General (Internal Medicine) Lucie Leather, Alvira Philips, MD as Consulting Physician (Allergy and Immunology) Ellis Savage, NP as Nurse Practitioner Luciano Cutter, MD as Consulting Physician (Pulmonary Disease) Anson Fret, MD as Consulting Physician (Neurology) Today's Healthcare Provider: Lula Olszewski, MD   Assessment and Plan:   1. Immunodeficiency:    - Discuss resuming immunoglobulin therapy after consultation with immunologist    - Address administration issues and explore alternative administration methods    - Order immunoglobulin levels (IgG, IgA, IgM) to reassess current status on next blood draw to assist immunology  2. Hypertension:    - Continue propranolol LA 60mg  daily    - Reduce hydrochlorothiazide to 12.5mg  daily    - Discontinue Cozaar    - Encourage increased salt intake    - Monitor blood pressure at home, aiming for goal <140/90    - Follow up in 4 weeks to reassess blood pressure control  3. Sleep Issues/Fatigue:    - Order sleep study to evaluate for sleep apnea    - Encourage use of sleep tracker app    - Discuss sleep hygiene measures    - Consider referral to sleep specialist if study is positive  4. GERD/Reflux:    - Prescribe Pepcid Complete for as-needed use    - Refill famotidine 40mg  daily at bedtime    - Discuss lifestyle modifications (elevated head of bed, avoid eating before bedtime)  5. Depression:    - Continue sertraline 200mg  daily    - Monitor for side effects and efficacy    - Assess PHQ-9 score at follow-up visits  6. Neuropathy:    - Consider trial of Lyrica if patient is interested    - Refer to pain management for evaluation and treatment options    - Order Lyme Disease Serology w/Reflex to rule out Lyme as a  cause  7. Liver Issues/Low Platelets:    - Order comprehensive metabolic panel and complete blood count    - Monitor liver function and platelet count    - Discuss alcohol cessation and its impact on liver health    - Consider referral to hepatology if liver enzymes remain elevated  8. Dry Eyes/Mouth:    - Refer to rheumatologist for further evaluation of Sjgren's syndrome, including potential biopsy    - Recommend over-the-counter artificial tears and saliva substitutes  9. Weight Management:    - Discuss diet and exercise strategies    - Consider referral to nutritionist    - Address impact of potential sleep apnea on weight management  10. Leg Mass:     - Order ultrasound of the leg to evaluate mass     - Follow up in one month; if not resolved, proceed with imaging  11. Skin Issues:     - Evaluate red spots on stomach and chest, likely cherry angiomas     - Monitor for any changes or new lesions     - Consider dermatology referral if persistent or changing  12. Vertigo:     - Refer for vestibular rehabilitation     - Consider ENT evaluation if symptoms persist  13. Chronic Sinusitis:     - Encourage smoking cessation to improve  symptoms     - Consider referral to ENT if symptoms persist despite smoking cessation  14. Smoking Cessation:     - Discuss smoking cessation strategies     - Offer nicotine replacement therapy or other smoking cessation aids     - Refer to smoking cessation program if patient is interested  15. Preventive Care:     - Order lipid panel, HbA1c, vitamin B12, and vitamin D levels     - Schedule lung cancer screening - reordered     - Update vaccinations as needed     - Order Myasthenia gravis panel 2 to rule out as cause of fatigue(she requested)  16. Follow-up:     - Schedule follow-up appointment in 4-6 weeks to review test results and medication changes     - Encouraged patient to keep a symptom diary to track progress and any new  concerns   Orders Placed This Encounter  Procedures   CBC with Differential/Platelet   Comprehensive metabolic panel    [place]    Order Specific Question:   Has the patient fasted?    Answer:   No   Lipid panel    [place]    Order Specific Question:   Has the patient fasted?    Answer:   No   Hemoglobin A1c    [place]   Microalbumin / creatinine urine ratio   B12 and Folate Panel   Vitamin D (25 hydroxy)   Myasthenia gravis panel 2   Lyme Disease Serology w/Reflex    Standing Status:   Future    Number of Occurrences:   1    Standing Expiration Date:   08/26/2023   Ambulatory Referral for Lung Cancer Screening [[id number]]    Referral Priority:   Routine    Referral Type:   Consultation    Referral Reason:   Specialty Services Required    Number of Visits Requested:   1     Recommended follow up: Return in about 1 month (around 09/26/2022) for close follow up acute illness leg lump in thigh, make sure resolve.  Future Appointments  Date Time Provider Department Center  11/11/2022  1:30 PM Kozlow, Alvira Philips, MD [facility] None  11/20/2022  9:30 AM Shawnie Dapper, NP [facility] None           Clinical Presentation:      AI-Extracted: Discussed the use of AI scribe software for clinical note transcription with the patient, who gave verbal consent to proceed.  The patient is a 57 year old female with a complex medical history, presenting for medication management and follow-up on multiple chronic conditions. Her history is significant for:  1. Immunodeficiency: History of hypogammaglobulinemia requiring subcutaneous immunoglobulin (Xembify) therapy. Patient reports discontinuing therapy due to administration difficulties, scar tissue formation, and lack of perceived benefit.  2. Hypertension: Currently managed with hydrochlorothiazide and propranolol. Patient reports episodes of hypotension, especially after discontinuing Cozaar. She has been self-adjusting her hydrochlorothiazide  dose.  3. Sleep Issues/Fatigue: Reports poor sleep quality, snoring, and severe fatigue. Previous sleep study was negative but was performed several years ago. Patient suspects sleep apnea.  4. GERD/Reflux: Reports pharyngeal reflux symptoms.  5. Depression: Managed with sertraline 200mg  daily.  6. Neuropathy: Reports persistent symptoms, possibly related to a pinched nerve. Previous treatments with Gabapentin and Cymbalta were ineffective or poorly tolerated.  7. Liver Issues: History of mildly elevated liver enzymes and low platelets (consistently between 110-130 for at least 30 years).  8. Dry Eyes/Mouth: Previously tested  negative for Sjgren's syndrome.  9. Weight Management: Desires to lose weight but struggles with fatigue and lack of motivation.  10. Leg Mass: Reports a persistent lump in her leg, possibly related to [facility] administration.  11. Skin Issues: Notes red spots on stomach and chest, possibly related to liver disease.  12. Vertigo: Reports episodes of vertigo.  13. Chronic Sinusitis: Persistent symptoms despite previous immunoglobulin therapy.  14. Smoking: Current smoker, acknowledges need to quit.  Additional history includes a mastectomy and oophorectomy for BRCA2 gene mutation, family history of hypogammaglobulinemia and low platelets, and multiple other chronic conditions including allergic rhinitis, degenerative disc disease, hyperlipidemia, and vitamin D deficiency. Patient is a female with multiple chronic health issues, including: - Sinus infections and respiratory issues - History of immunoglobulin deficiency, treated with subcutaneous immunoglobulin (Xembify) - Hypertension, currently managed with medications - GERD/pharyngeal reflux - Fatigue and sleep issues, suspected sleep apnea - Depression, managed with sertraline - Neuropathy - Liver issues with low platelets - Dry eyes and mouth, negative for Sjgren's syndrome - Recent weight gain - Leg  mass noted by patient  Patient reports: - Stopping subcutaneous immunoglobulin due to administration difficulties and lack of noticeable improvement - Blood pressure fluctuations, especially with hydrochlorothiazide, low blood pressure and has stopped losartan - Ongoing fatigue and poor sleep quality - Dry mouth and eyes persisting, Sjgren testing negative, ?medication(s) side effect(s)   - Concern about leg mass present for about 6 months- but wants to hold off on imaging due to thinks its just reaction to xembify injections there. - Red spots on stomach and chest (which are capillary hemangiomas)    History of Present Illness   The patient, with a complex medical history, presents for medication management. She reports discontinuing Topamax and Glucophage due to intolerable side effects, including insomnia and severe gastrointestinal distress. The patient also mentions a cessation of their immunoglobulin infusions, Zembify, for over a month due to the formation of knots at the infusion sites and a lack of perceived benefit.  The patient reports persistent fatigue, which she attributes to poor sleep quality. She expresses interest in undergoing a sleep study, as she suspects she may have sleep apnea. She also reports a history of chronic sinus infections, which she believes may be contributing to her fatigue.  The patient has been managing their blood pressure with hydrochlorothiazide, propranolol, and Cozaar, but reports frequent episodes of hypotension. She has been halving their hydrochlorothiazide dose to manage these episodes and has discontinued Cozaar due to its perceived contribution to her low blood pressure.  The patient also reports a history of neuropathy, which she manages with vitamin B12. She expresses concern about lumps in her legs, which she suspects may be residual Zembify from her infusions. She also reports the recent development of red spots on her stomach and chest, which  she suspects may be related to her liver disease.  The patient expresses a desire to lose weight but struggles with fatigue and lack of motivation. She reports a minimal intake of bread and pasta and denies alcohol consumption. She also expresses concern about her cholesterol levels, which were close to her goal at her last check.  In summary, the patient presents with a complex medical history and multiple ongoing concerns, including medication side effects, suspected sleep apnea, blood pressure fluctuations, neuropathy, and unexplained physical symptoms. She expresses a desire for improved symptom management and overall health improvement.        She  has a past medical history  of Abnormal mammogram (05/17/2020), Allergy, Bereavement, uncomplicated (05/17/2020), Chronic sinusitis (12/23/2018), Degenerative disc disease, cervical (11/07/2021), Depression, Endometriosis, Fatty liver (11/07/2021), Gastritis (07/11/2016), GERD (gastroesophageal reflux disease), Hyperlipidemia, Hypertension, Hypogammaglobulinemia (HCC), Lumbar degenerative disc disease (11/07/2021), Palmar erythema (11/07/2021), Panic attacks, Raised TSH level (10/09/2016), Sialadenitis (02/09/2018), Sinusitis, Stress incontinence (05/17/2020), Upper back pain on left side (12/27/2019), Vitamin B12 deficiency (12/23/2018), and Vitamin D deficiency (12/23/2018). Current Outpatient Medications on File Prior to Visit  Medication Sig   antiseptic oral rinse (BIOTENE) LIQD 15 mLs by Mouth Rinse route as needed for dry mouth.   cetirizine (ZYRTEC) 10 MG tablet as needed.   clonazePAM (KLONOPIN) 0.5 MG tablet Take 1 tablet by mouth 3 times a day as needed   cyanocobalamin (VITAMIN B12) 1000 MCG tablet Take 1,000 mcg by mouth daily.   diphenhydrAMINE (BENADRYL) 12.5 MG/5ML elixir Take by mouth every 14 (fourteen) days. Takes for infusions.   EPINEPHrine 0.3 mg/0.3 mL IJ SOAJ injection Inject into the muscle.   Propylene Glycol (SYSTANE  COMPLETE) 0.6 % SOLN Apply 1 drop to eye 4 (four) times daily as needed.   sertraline (ZOLOFT) 100 MG tablet Take 2 tablets by mouth daily   loratadine (CLARITIN) 10 MG tablet Take 1 tablet (10 mg total) by mouth 2 (two) times daily. (Patient not taking: Reported on 08/26/2022)   No current facility-administered medications on file prior to visit.   Medications Discontinued During This Encounter  Medication Reason   nystatin (MYCOSTATIN) 100000 UNIT/ML suspension    metFORMIN (GLUCOPHAGE) 500 MG tablet    topiramate (TOPAMAX) 25 MG tablet Side effect (s)   Immune Globulin, Human,-klhw (XEMBIFY) 10 GM/50ML SOLN Completed Course   cyanocobalamin (VITAMIN B12) 1000 MCG/ML injection    losartan (COZAAR) 25 MG tablet Completed Course   cholecalciferol (VITAMIN D3) 25 MCG (1000 UNIT) tablet Reorder   famotidine (PEPCID) 40 MG tablet Reorder   rosuvastatin (CRESTOR) 20 MG tablet Reorder   propranolol ER (INDERAL LA) 60 MG 24 hr capsule Reorder   hydrochlorothiazide (HYDRODIURIL) 25 MG tablet Reorder         Clinical Data Analysis:   Physical Exam  BP 104/80 (BP Location: Left Arm, Patient Position: Sitting)   Pulse 71   Temp 98.3 F (36.8 C) (Temporal)   Ht 5' (1.524 m)   Wt 162 lb 6.4 oz (73.7 kg)   SpO2 96%   BMI 31.72 kg/m  Wt Readings from Last 10 Encounters:  08/26/22 162 lb 6.4 oz (73.7 kg)  05/13/22 162 lb 6.4 oz (73.7 kg)  05/06/22 159 lb 6.4 oz (72.3 kg)  05/02/22 160 lb 3.2 oz (72.7 kg)  02/28/22 159 lb 9.6 oz (72.4 kg)  02/19/22 158 lb (71.7 kg)  02/07/22 156 lb (70.8 kg)  12/02/21 158 lb 6.4 oz (71.8 kg)  11/07/21 159 lb 9.6 oz (72.4 kg)  10/29/21 157 lb 6.4 oz (71.4 kg)   Vital signs reviewed.  Nursing notes reviewed. Weight trend reviewed. Abnormalities and Problem-Specific physical exam findings:  capillary hemangiomas extensive on abdomen. Mild truncal adiposity. Lump in upper quads could be injection(s) site reaction./ General Appearance:  No acute distress  appreciable.   Well-groomed, healthy-appearing female.  Well proportioned with no abnormal fat distribution.  Good muscle tone. Skin: Clear and well-hydrated. Pulmonary:  Normal work of breathing at rest, no respiratory distress apparent. SpO2: 96 %  Musculoskeletal: All extremities are intact.  Neurological:  Awake, alert, oriented, and engaged.  No obvious focal neurological deficits or cognitive impairments.  Sensorium seems unclouded.  Speech is clear and coherent with logical content. Psychiatric:  Appropriate mood, pleasant and cooperative demeanor, thoughtful and engaged during the exam  Results Reviewed:     Results for orders placed or performed in visit on 08/26/22  CBC with Differential/Platelet  Result Value Ref Range   WBC 6.9 4.0 - 10.5 K/uL   RBC 4.76 3.87 - 5.11 Mil/uL   Hemoglobin 14.0 12.0 - 15.0 g/dL   HCT 27.0 62.3 - 76.2 %   MCV 90.0 78.0 - 100.0 fl   MCHC 32.7 30.0 - 36.0 g/dL   RDW 83.1 51.7 - 61.6 %   Platelets 122.0 (L) 150.0 - 400.0 K/uL   Neutrophils Relative % 63.9 43.0 - 77.0 %   Lymphocytes Relative 26.4 12.0 - 46.0 %   Monocytes Relative 5.2 3.0 - 12.0 %   Eosinophils Relative 4.0 0.0 - 5.0 %   Basophils Relative 0.5 0.0 - 3.0 %   Neutro Abs 4.4 1.4 - 7.7 K/uL   Lymphs Abs 1.8 0.7 - 4.0 K/uL   Monocytes Absolute 0.4 0.1 - 1.0 K/uL   Eosinophils Absolute 0.3 0.0 - 0.7 K/uL   Basophils Absolute 0.0 0.0 - 0.1 K/uL  Comprehensive metabolic panel  Result Value Ref Range   Sodium 143 135 - 145 mEq/L   Potassium 4.4 3.5 - 5.1 mEq/L   Chloride 107 96 - 112 mEq/L   CO2 26 19 - 32 mEq/L   Glucose, Bld 91 70 - 99 mg/dL   BUN 15 6 - 23 mg/dL   Creatinine, Ser 0.73 0.40 - 1.20 mg/dL   Total Bilirubin 0.6 0.2 - 1.2 mg/dL   Alkaline Phosphatase 57 39 - 117 U/L   AST 17 0 - 37 U/L   ALT 10 0 - 35 U/L   Total Protein 7.5 6.0 - 8.3 g/dL   Albumin 4.9 3.5 - 5.2 g/dL   GFR 71.06 >26.94 mL/min   Calcium 10.5 8.4 - 10.5 mg/dL  Lipid panel  Result Value Ref  Range   Cholesterol 175 0 - 200 mg/dL   Triglycerides 854.6 0.0 - 149.0 mg/dL   HDL 27.03 >50.09 mg/dL   VLDL 38.1 0.0 - 82.9 mg/dL   LDL Cholesterol 94 0 - 99 mg/dL   Total CHOL/HDL Ratio 3    NonHDL 119.49   Hemoglobin A1c  Result Value Ref Range   Hgb A1c MFr Bld 5.4 4.6 - 6.5 %  Microalbumin / creatinine urine ratio  Result Value Ref Range   Microalb, Ur <0.7 0.0 - 1.9 mg/dL   Creatinine,U 93.7 mg/dL   Microalb Creat Ratio 1.6 0.0 - 30.0 mg/g  B12 and Folate Panel  Result Value Ref Range   Vitamin B-12 502 211 - 911 pg/mL   Folate 7.1 >5.9 ng/mL  Vitamin D (25 hydroxy)  Result Value Ref Range   VITD 46.69 30.00 - 100.00 ng/mL    Office Visit on 08/26/2022  Component Date Value   WBC 08/26/2022 6.9    RBC 08/26/2022 4.76    Hemoglobin 08/26/2022 14.0    HCT 08/26/2022 42.8    MCV 08/26/2022 90.0    MCHC 08/26/2022 32.7    RDW 08/26/2022 13.6    Platelets 08/26/2022 122.0 (L)    Neutrophils Relative % 08/26/2022 63.9    Lymphocytes Relative 08/26/2022 26.4    Monocytes Relative 08/26/2022 5.2    Eosinophils Relative 08/26/2022 4.0    Basophils Relative 08/26/2022 0.5    Neutro Abs 08/26/2022 4.4    Lymphs Abs  08/26/2022 1.8    Monocytes Absolute 08/26/2022 0.4    Eosinophils Absolute 08/26/2022 0.3    Basophils Absolute 08/26/2022 0.0    Sodium 08/26/2022 143    Potassium 08/26/2022 4.4    Chloride 08/26/2022 107    CO2 08/26/2022 26    Glucose, Bld 08/26/2022 91    BUN 08/26/2022 15    Creatinine, Ser 08/26/2022 0.74    Total Bilirubin 08/26/2022 0.6    Alkaline Phosphatase 08/26/2022 57    AST 08/26/2022 17    ALT 08/26/2022 10    Total Protein 08/26/2022 7.5    Albumin 08/26/2022 4.9    GFR 08/26/2022 89.96    Calcium 08/26/2022 10.5    Cholesterol 08/26/2022 175    Triglycerides 08/26/2022 128.0    HDL 08/26/2022 55.50    VLDL 08/26/2022 25.6    LDL Cholesterol 08/26/2022 94    Total CHOL/HDL Ratio 08/26/2022 3    NonHDL 08/26/2022 119.49    Hgb  A1c MFr Bld 08/26/2022 5.4    Microalb, Ur 08/26/2022 <0.7    Creatinine,U 08/26/2022 44.0    Microalb Creat Ratio 08/26/2022 1.6    Vitamin B-12 08/26/2022 502    Folate 08/26/2022 7.1    VITD 08/26/2022 46.69   Office Visit on 05/02/2022  Component Date Value   Cholesterol 05/02/2022 169    Triglycerides 05/02/2022 113.0    HDL 05/02/2022 45.50    VLDL 05/02/2022 22.6    LDL Cholesterol 05/02/2022 101 (H)    Total CHOL/HDL Ratio 05/02/2022 4    NonHDL 05/02/2022 123.29    TSH 05/02/2022 2.89    Sodium 05/02/2022 141    Potassium 05/02/2022 4.3    Chloride 05/02/2022 108    CO2 05/02/2022 25    Glucose, Bld 05/02/2022 90    BUN 05/02/2022 12    Creatinine, Ser 05/02/2022 0.71    Total Bilirubin 05/02/2022 0.7    Alkaline Phosphatase 05/02/2022 79    AST 05/02/2022 17    ALT 05/02/2022 11    Total Protein 05/02/2022 7.0    Albumin 05/02/2022 4.6    GFR 05/02/2022 94.75    Calcium 05/02/2022 9.9    WBC 05/02/2022 6.6    RBC 05/02/2022 4.93    Hemoglobin 05/02/2022 14.8    HCT 05/02/2022 44.0    MCV 05/02/2022 89.1    MCHC 05/02/2022 33.6    RDW 05/02/2022 13.9    Platelets 05/02/2022 117.0 (L)    Neutrophils Relative % 05/02/2022 62.3    Lymphocytes Relative 05/02/2022 28.7    Monocytes Relative 05/02/2022 4.7    Eosinophils Relative 05/02/2022 3.6    Basophils Relative 05/02/2022 0.7    Neutro Abs 05/02/2022 4.1    Lymphs Abs 05/02/2022 1.9    Monocytes Absolute 05/02/2022 0.3    Eosinophils Absolute 05/02/2022 0.2    Basophils Absolute 05/02/2022 0.0    VITD 05/02/2022 25.92 (L)    Hgb A1c MFr Bld 05/02/2022 5.2    Vitamin B-12 05/02/2022 275    Folate 05/02/2022 5.7 (L)    Total CK 05/02/2022 159    IgG (Immunoglobin G), Se* 05/02/2022 998    Free T4 05/02/2022 0.85    Intrinsic Factor 05/02/2022 Negative    Methylmalonic Acid, Quant 05/02/2022 116    RPR Ser Ql 05/02/2022 NON-REACTIVE    Lyme Total Antibody EIA 05/02/2022 CANCELED    HIV 1&2 Ab, 4th  Generati* 05/02/2022 NON-REACTIVE   Office Visit on 02/19/2022  Component Date Value   Sed Rate 02/19/2022  6    CRP 02/19/2022 0.4    Ribonucleic Protein(ENA)* 02/19/2022 <1.0 NEG    SSA (Ro) (ENA) Antibody,* 02/19/2022 <1.0 NEG    SSB (La) (ENA) Antibody,* 02/19/2022 <1.0 NEG    C3 Complement 02/19/2022 140    C4 Complement 02/19/2022 46    Rheumatoid fact SerPl-aC* 02/19/2022 <14   Admission on 12/02/2021, Discharged on 12/02/2021  Component Date Value   Sodium 12/02/2021 140    Potassium 12/02/2021 4.3    Chloride 12/02/2021 106    CO2 12/02/2021 28    Glucose, Bld 12/02/2021 92    BUN 12/02/2021 10    Creatinine, Ser 12/02/2021 0.77    Calcium 12/02/2021 10.1    GFR, Estimated 12/02/2021 >60    Anion gap 12/02/2021 6    WBC 12/02/2021 6.5    RBC 12/02/2021 4.93    Hemoglobin 12/02/2021 14.7    HCT 12/02/2021 44.7    MCV 12/02/2021 90.7    MCH 12/02/2021 29.8    MCHC 12/02/2021 32.9    RDW 12/02/2021 13.0    Platelets 12/02/2021 114 (L)    nRBC 12/02/2021 0.0    Troponin I (High Sensiti* 12/02/2021 3    Preg Test, Ur 12/02/2021 NEGATIVE    D-Dimer, Quant 12/02/2021 0.33    Troponin I (High Sensiti* 12/02/2021 2   Office Visit on 11/07/2021  Component Date Value   Cholesterol 11/07/2021 282 (H)    HDL 11/07/2021 42 (L)    Triglycerides 11/07/2021 208 (H)    LDL Cholesterol (Calc) 11/07/2021 201 (H)    Total CHOL/HDL Ratio 11/07/2021 6.7 (H)    Non-HDL Cholesterol (Cal* 11/07/2021 240 (H)    Glucose, Bld 11/07/2021 89    BUN 11/07/2021 15    Creat 11/07/2021 0.77    eGFR 11/07/2021 90    BUN/Creatinine Ratio 11/07/2021 SEE NOTE:    Sodium 11/07/2021 139    Potassium 11/07/2021 4.2    Chloride 11/07/2021 106    CO2 11/07/2021 26    Calcium 11/07/2021 10.0    Total Protein 11/07/2021 7.2    Albumin 11/07/2021 4.6    Globulin 11/07/2021 2.6    AG Ratio 11/07/2021 1.8    Total Bilirubin 11/07/2021 0.5    Alkaline phosphatase (AP* 11/07/2021 67    AST  11/07/2021 14    ALT 11/07/2021 8    WBC 11/07/2021 5.3    RBC 11/07/2021 4.77    Hemoglobin 11/07/2021 14.7    HCT 11/07/2021 43.0    MCV 11/07/2021 90.1    MCH 11/07/2021 30.8    MCHC 11/07/2021 34.2    RDW 11/07/2021 12.6    Platelets 11/07/2021 132 (L)    MPV 11/07/2021 12.8 (H)    Neutro Abs 11/07/2021 3,138    Lymphs Abs 11/07/2021 1,701    Absolute Monocytes 11/07/2021 270    Eosinophils Absolute 11/07/2021 148    Basophils Absolute 11/07/2021 42    Neutrophils Relative % 11/07/2021 59.2    Total Lymphocyte 11/07/2021 32.1    Monocytes Relative 11/07/2021 5.1    Eosinophils Relative 11/07/2021 2.8    Basophils Relative 11/07/2021 0.8    Fibrosis Score 11/07/2021 0.05    Fibrosis Stage 11/07/2021 Comment    Steatosis Score 11/07/2021 0.45 (H)    Steatosis Grade 11/07/2021 Comment    NASH Score 11/07/2021 0.21    NASH Grade 11/07/2021 Comment    Methodology: 11/07/2021 Comment    ALPHA 2-MACROGLOBULINS, * 11/07/2021 140    Haptoglobin 11/07/2021 140    Apolipoprotein  A-1 11/07/2021 131    Bilirubin, Total 11/07/2021 0.3    GGT 11/07/2021 9    ALT (SGPT) P5P 11/07/2021 11    AST (SGOT) P5P 11/07/2021 20    Cholesterol, Total 11/07/2021 284 (H)    Glucose 11/07/2021 93    Triglycerides 11/07/2021 215 (H)    Interpretations: 11/07/2021 Comment    Fibrosis Scoring: 11/07/2021 Comment    Steatosis Scoring 11/07/2021 Comment    NASH Scoring 11/07/2021 Comment    Limitations: 11/07/2021 Comment    Comment: 11/07/2021 Comment    Mitochondrial M2 Ab, IgG 11/07/2021 <=20.0    LKM1 Ab 11/07/2021 <=20.0    Vitamin B-12 11/07/2021 632    Vit D, 25-Hydroxy 11/07/2021 30   Office Visit on 10/29/2021  Component Date Value   IgG (Immunoglobin G), Se* 10/29/2021 865    IgA/Immunoglobulin A, Se* 10/29/2021 119    IgM (Immunoglobulin M), * 10/29/2021 87    IgE (Immunoglobulin E), * 10/29/2021 40    No image results found.   No results found.  MM 3D SCREEN BREAST  BILATERAL  Result Date: 02/07/2022 CLINICAL DATA:  Screening. EXAM: DIGITAL SCREENING BILATERAL MAMMOGRAM WITH TOMOSYNTHESIS AND CAD TECHNIQUE: Bilateral screening digital craniocaudal and mediolateral oblique mammograms were obtained. Bilateral screening digital breast tomosynthesis was performed. The images were evaluated with computer-aided detection. COMPARISON:  Previous exam(s). ACR Breast Density Category c: The breast tissue is heterogeneously dense, which may obscure small masses. FINDINGS: There are no findings suspicious for malignancy. IMPRESSION: No mammographic evidence of malignancy. A result letter of this screening mammogram will be mailed directly to the patient. RECOMMENDATION: Screening mammogram in one year. (Code:SM-B-01Y) BI-RADS CATEGORY  1: Negative. Electronically Signed   By: Bary Richard M.D.   On: 02/07/2022 12:56    Attestation:  I have personally spent  64 minutes involved in face-to-face and non-face-to-face activities for this patient on the day of the visit. Professional time spent includes the following activities:  Preparing to see the patient by reviewing medical records prior to and during the encounter; Obtaining, documenting, and reviewing an updated medical history; Performing a medically appropriate examination;  extensively evaluating, synthesizing, and documenting the available clinical information in the EMR to enhance longitudinal management and improve ability to coordinating/communicating with other health care professionals; Collaboratively developing and communicating an individualized treatment plan with the patient; Placing medically necessary orders (for medications/tests/procedures/referrals);   The extended duration of this patient visit was medically necessary due to several factors:  The patient's health condition is extraordinarily multifaceted, requiring a comprehensive evaluation of patient and their past records to ensure accurate diagnosis and  treatment planning; Effective patient education and communication, particularly for patients with complex care needs, often require additional time to ensure the patient (or caregivers) fully understand the care plan;  Coordination of care with other healthcare professionals and services depends on thorough documentation, extending both documentation time and visit durations.  All these factors are integral to providing high-quality patient care and ensuring optimal health outcomes.     This encounter employed real-time, collaborative documentation. The patient actively reviewed and updated their medical record on a shared screen, ensuring transparency and facilitating joint problem-solving for the problem list, overview, and plan. This approach promotes accurate, informed care. The treatment plan was discussed and reviewed in detail, including medication safety, potential side effects, and all patient questions. We confirmed understanding and comfort with the plan. Follow-up instructions were established, including contacting the office for any concerns, returning if symptoms worsen, persist,  or new symptoms develop, and precautions for potential emergency department visits. ----------------------------------------------------- Lula Olszewski, MD  08/26/2022 6:55 PM  [facility] at [facility]:  [phone]

## 2022-08-26 NOTE — Assessment & Plan Note (Addendum)
Continue propranolol LA 60mg  daily Reduce hydrochlorothiazide to 12.5mg  daily Discontinue Cozaar Encourage increased salt intake Monitor blood pressure at home, aiming for goal <140/90 Follow up in 4 weeks to reassess blood pressure control

## 2022-08-26 NOTE — Assessment & Plan Note (Signed)
Takes B12, but encouraged her to see pain management, no back XR in long term offered. Neurologist: plans emgs. Didn't like gabapentin Cymbalta was on 35yr didn't like it and it didn't help pain, but low dose, felt disassociated Lyrica- never tried offer.  Afraid to offer.

## 2022-08-26 NOTE — Assessment & Plan Note (Signed)
Discuss diet and exercise strategies Consider referral to nutritionist Address impact of potential sleep apnea on weight management Order CBC with Differential/Platelet, Comprehensive metabolic panel, Lipid panel, Hemoglobin A1c, Microalbumin / creatinine urine ratio, B12 and Folate Panel

## 2022-08-26 NOTE — Assessment & Plan Note (Signed)
Offered to order   ultrasound of the leg to evaluate mass Follow up in one month; if not resolved, proceed with imaging

## 2022-08-26 NOTE — Assessment & Plan Note (Signed)
Discuss pacing strategies for energy conservation Consider referral to ME/CFS specialist Order Myasthenia gravis panel 2 to rule out as cause of fatigue

## 2022-08-26 NOTE — Assessment & Plan Note (Signed)
Recommend over-the-counter artificial tears Consider referral to ophthalmologist for comprehensive evaluation

## 2022-08-28 ENCOUNTER — Other Ambulatory Visit: Payer: Self-pay

## 2022-08-28 DIAGNOSIS — I10 Essential (primary) hypertension: Secondary | ICD-10-CM

## 2022-08-28 MED ORDER — HYDROCHLOROTHIAZIDE 12.5 MG PO TABS: 12.5000 mg | ORAL_TABLET | Freq: Every day | ORAL | 3 refills | Status: DC

## 2022-09-04 ENCOUNTER — Encounter: Payer: Self-pay | Admitting: Internal Medicine

## 2022-09-11 NOTE — Telephone Encounter (Signed)
[facility] states labs should be resulted to our office by EOD 09/11/2022.

## 2022-09-12 ENCOUNTER — Ambulatory Visit: Admitting: Internal Medicine

## 2022-09-17 NOTE — Telephone Encounter (Signed)
Patient has already seen these results.

## 2022-09-24 ENCOUNTER — Encounter: Payer: Self-pay | Admitting: Allergy and Immunology

## 2022-09-25 ENCOUNTER — Other Ambulatory Visit: Payer: Self-pay | Admitting: *Deleted

## 2022-09-25 MED ORDER — XEMBIFY 10 GM/50ML ~~LOC~~ SOLN: 20.0000 g | SUBCUTANEOUS | 11 refills | Status: DC

## 2022-10-07 ENCOUNTER — Ambulatory Visit: Admitting: Internal Medicine

## 2022-10-15 IMAGING — MG MM DIGITAL SCREENING BILAT W/ TOMO AND CAD
8 series · 9 of 24 positions shown · non-contrast
Comparison: Previous exam(s).

CLINICAL DATA: Screening.

EXAM:
DIGITAL SCREENING BILATERAL MAMMOGRAM WITH TOMOSYNTHESIS AND CAD
TECHNIQUE: Bilateral screening digital craniocaudal and mediolateral oblique
mammograms were obtained. Bilateral screening digital breast
tomosynthesis was performed. The images were evaluated with
computer-aided detection.

[L MLO synth-2D]
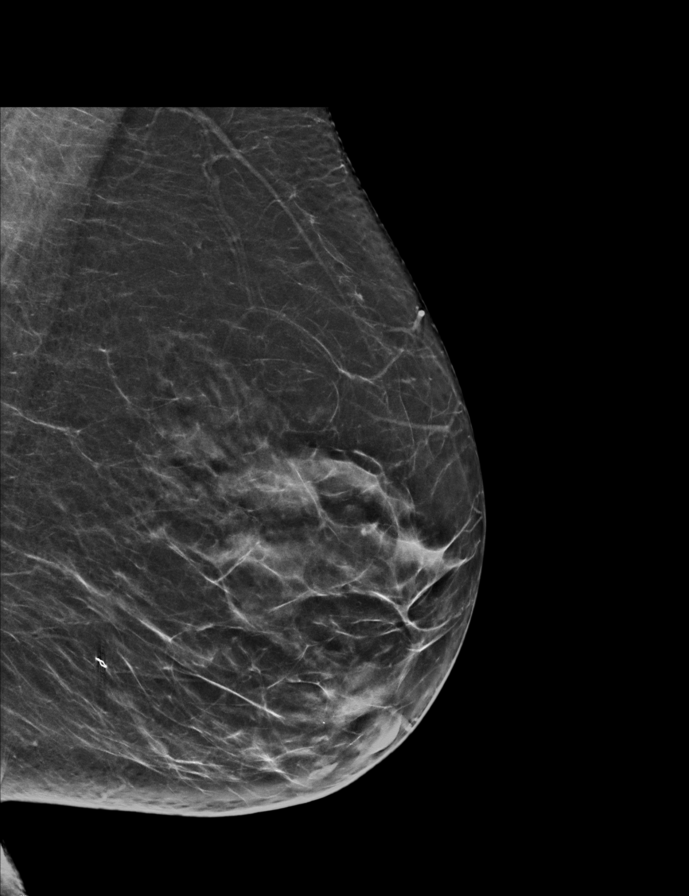

[R CC synth-2D]
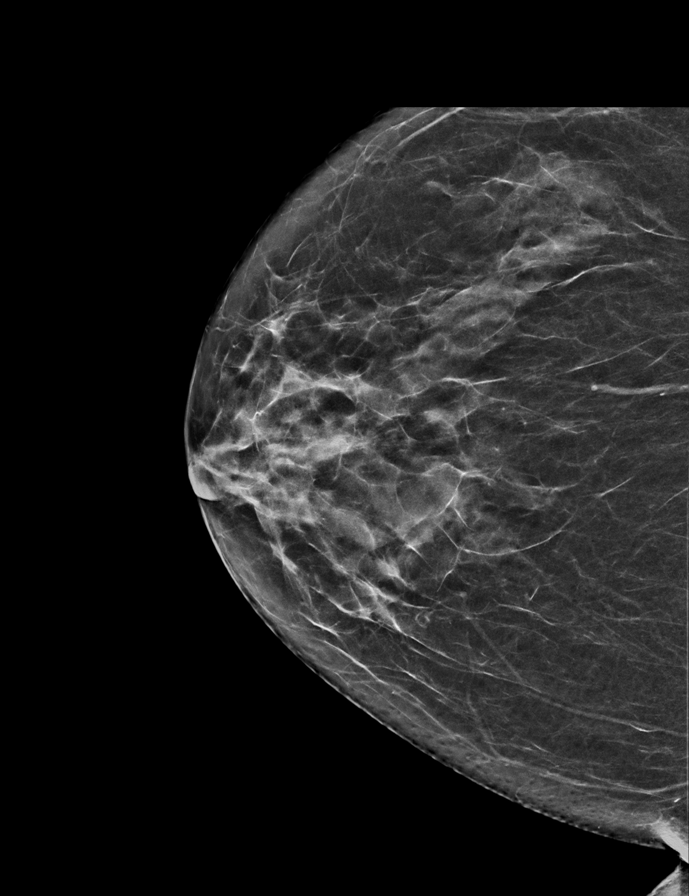

[L CC synth-2D]
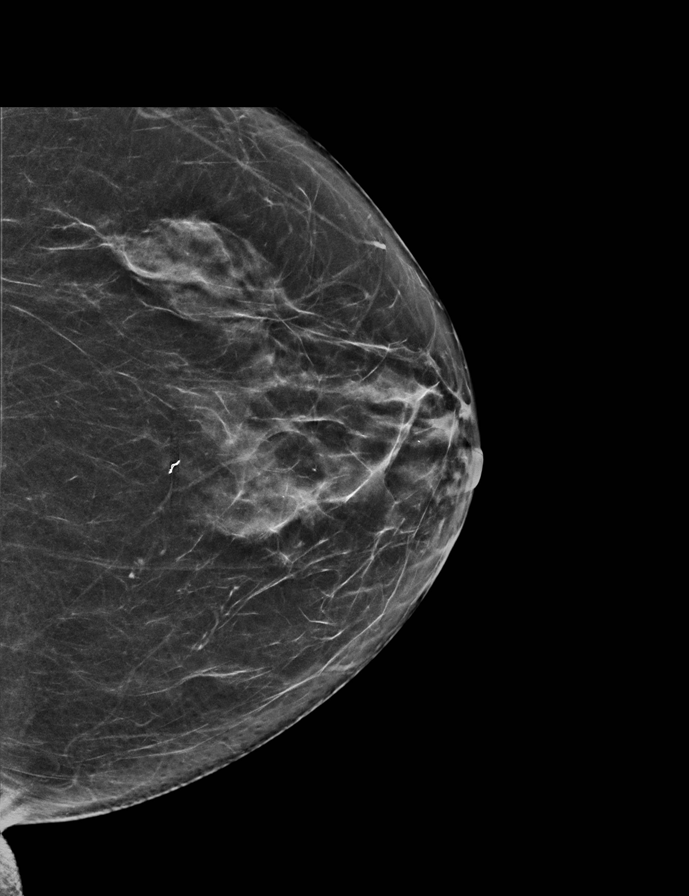

[R MLO synth-2D]
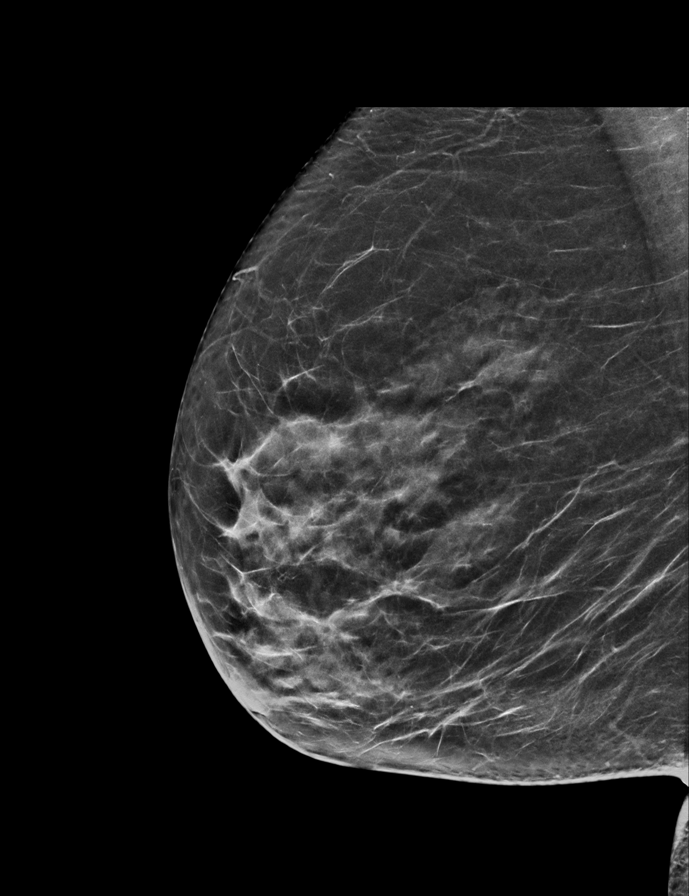

[R MLO tomo · 2 of 62 frames shown]
[frame 21/62]
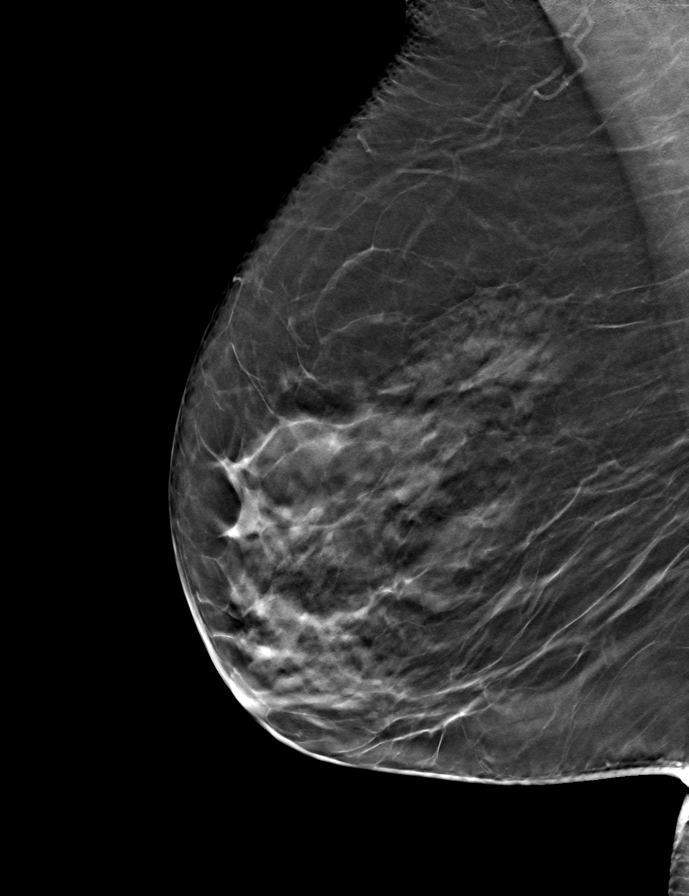
[frame 31/62]
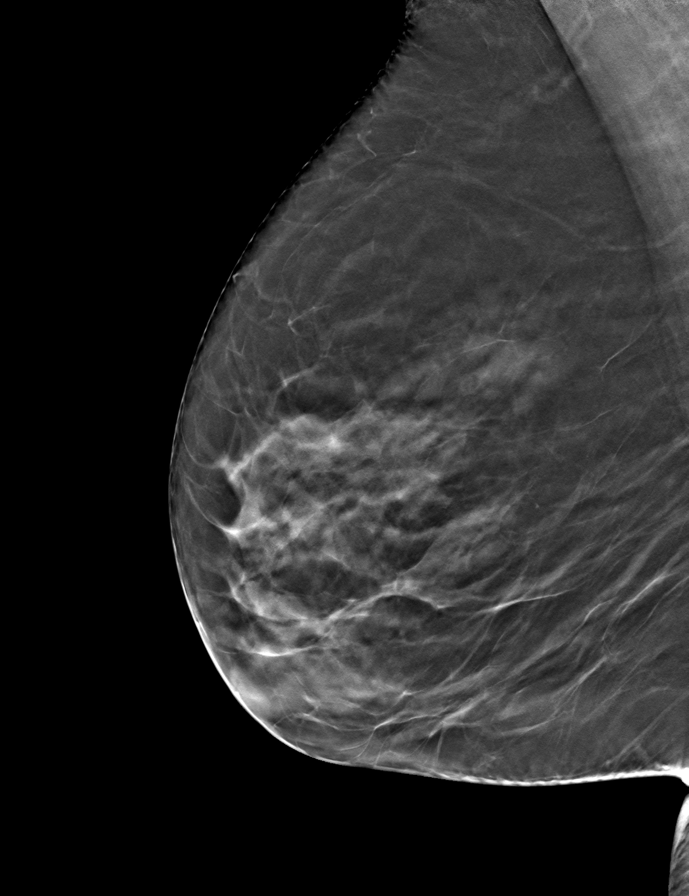

[R CC tomo · tomo slice 30/59.0]
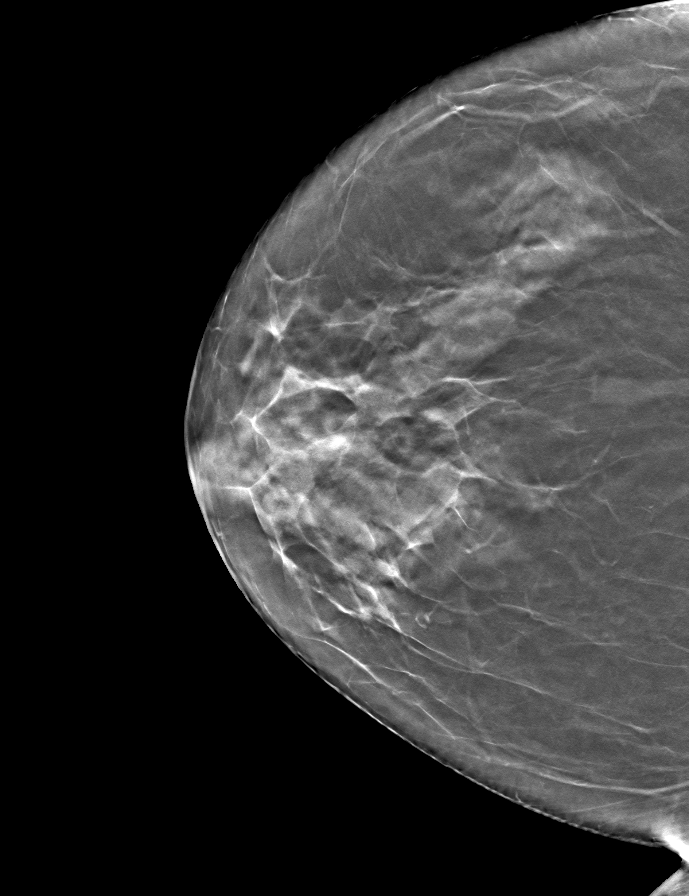

[L CC tomo · tomo slice 31/60.0]
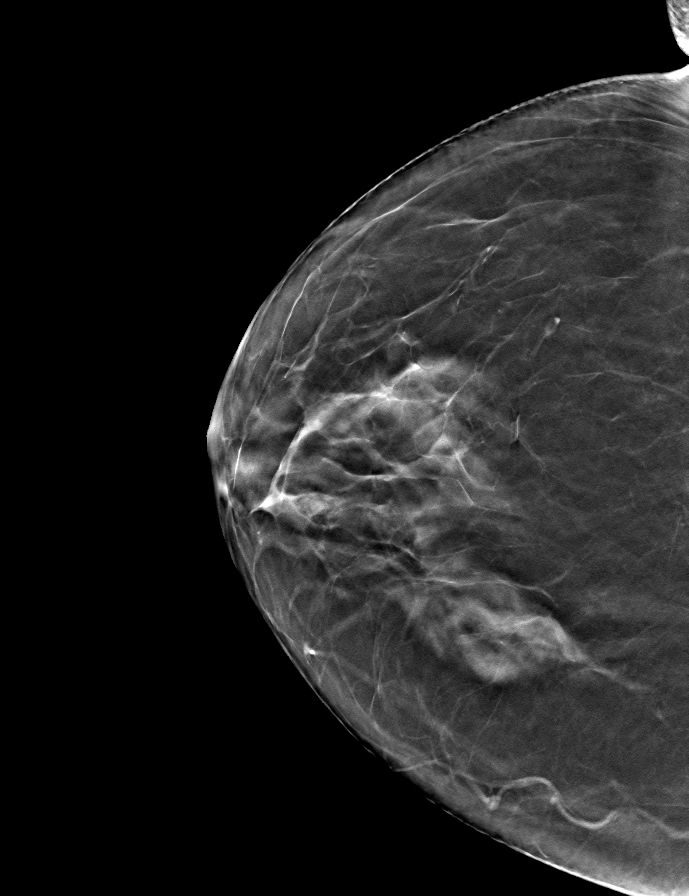

[L MLO tomo · tomo slice 31/61.0]
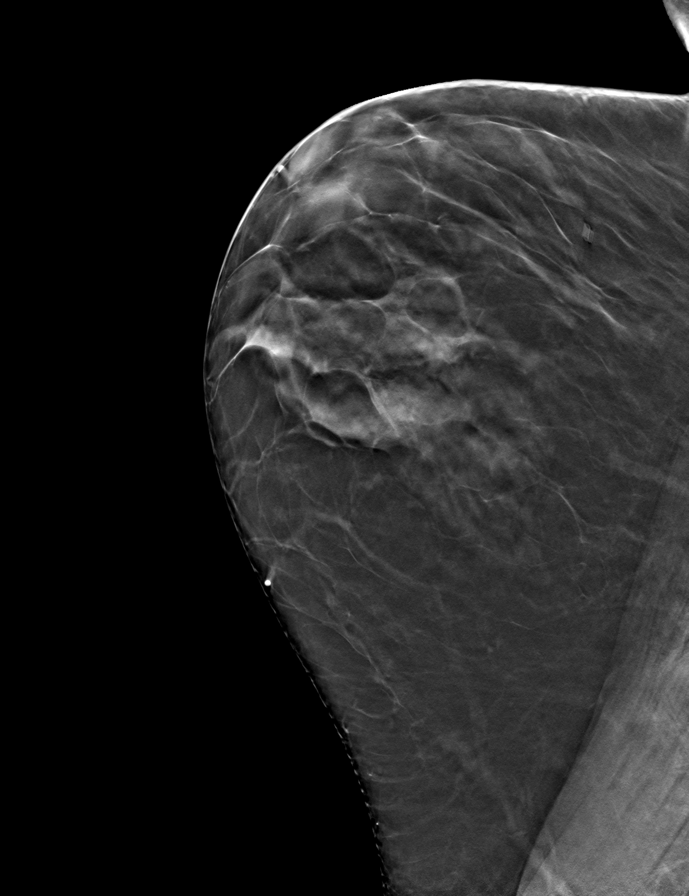

[9 of 24 positions shown; findings below may reference images not displayed]

ACR Breast Density Category c: The breast tissue is heterogeneously
dense, which may obscure small masses.
FINDINGS: There are no findings suspicious for malignancy.
IMPRESSION: No mammographic evidence of malignancy. A result letter of this
screening mammogram will be mailed directly to the patient.

RECOMMENDATION:
Screening mammogram in one year. (Code:Q3-W-BC3)

BI-RADS CATEGORY  1: Negative.

## 2022-10-30 ENCOUNTER — Encounter: Payer: Self-pay | Admitting: Internal Medicine

## 2022-10-30 ENCOUNTER — Ambulatory Visit: Admitting: Internal Medicine

## 2022-10-30 VITALS — BP 90/64 | HR 61 | Temp 97.9°F | Ht 60.0 in | Wt 158.2 lb

## 2022-10-30 DIAGNOSIS — M21619 Bunion of unspecified foot: Secondary | ICD-10-CM | POA: Diagnosis not present

## 2022-10-30 MED ORDER — KETOROLAC TROMETHAMINE 10 MG PO TABS: 10.0000 mg | ORAL_TABLET | Freq: Four times a day (QID) | ORAL | 0 refills | Status: AC | PRN

## 2022-10-30 NOTE — Progress Notes (Signed)
[facility]: [phone]   -- Medical Office Visit --  Patient:  Pamela Waller      Age: 57 y.o.       Sex:  female  Date:   10/30/2022 Patient Care Team: Lula Olszewski, MD as PCP - General (Internal Medicine) Lucie Leather Alvira Philips, MD as Consulting Physician (Allergy and Immunology) Ellis Savage, NP as Nurse Practitioner Luciano Cutter, MD as Consulting Physician (Pulmonary Disease) Anson Fret, MD as Consulting Physician (Neurology) Today's Healthcare Provider: Lula Olszewski, MD      Assessment & Plan Bunion Bunion   She presents with a sudden onset of a painful bunion, complicating her ability to wear shoes comfortably. While gout remains a differential diagnosis, it appears less likely. Preferring conservative management, she wishes to avoid injections and surgery. We will advise on footwear modifications, such as opting for roomy shoes and steering clear of high heels. Although she has tried over-the-counter bunion pads and spacers with limited success, we still recommend these alongside toe exercises, including towel curls and toe spreading. Taping techniques for bunion management will be considered. For pain management, we recommend topical NSAIDs like Voltaren and a short course of oral NSAIDs, such as Toradol. A follow-up in 4-6 weeks is planned to evaluate her response to conservative management, with a reconsideration for a referral to podiatry if there is no improvement.  Comprehensive management plan in meantime is posted to [facility]   Weight Management   She has successfully lost weight through [employer] and expresses interest in further weight loss strategies. Despite adverse reactions to previous weight loss medications, the discussion of potential weight loss medications remains relevant. We encourage the continuation of healthy dietary habits, specifically replacing sweets with protein and low/no sugar alternatives. A follow-up will be  considered to monitor weight loss progress and discuss further management options if necessary.  Diagnoses and all orders for this visit: Bunion  Recommended follow-up: 4-6 weeks  Future Appointments  Date Time Provider Department Center  11/11/2022  1:30 PM Kozlow, Alvira Philips, MD [facility] None  11/20/2022  9:30 AM Lomax, Amy, NP [facility] None        Subjective   57 y.o. female who has Generalized anxiety disorder with panic attacks; BRCA2 gene mutation positive; Fatigue; Hypertension; Hyperlipidemia LDL goal <100; Hypogammaglobulinemia (HCC); Idiopathic peripheral neuropathy; Myalgia; Nicotine dependence; Recurrent major depressive disorder, in partial remission (HCC); Chronic sinusitis; Vitamin D deficiency; B12 deficiency; Hypersomnolence; Allergic rhinitis due to pollen; Disorder of vision; Fibroadenosis of breast; Neuropathy; Thrombocytopenic disorder (HCC); Snoring; Tobacco use disorder; Insomnia; Memory changes; Dry cough; Family history of autoimmune disorder; Dry mouth and eyes; Palmar erythema; Fatty liver; Degenerative disc disease, cervical; Lumbar degenerative disc disease; Pleuritic pain; Bradycardia; OSA (obstructive sleep apnea); Vertigo; Long COVID; Obesity due to energy imbalance; Myopathy; CVID (common variable immunodeficiency) (HCC); Laryngopharyngeal reflux (LPR); and Leg mass on their problem list. Her reasons/main concerns/chief complaints for today's office visit are Foot Pain (A growth that she thinks is a possible bunion.)   ------------------------------------------------------------------------------------------------------------------------ AI-Extracted: Discussed the use of AI scribe software for clinical note transcription with the patient, who gave verbal consent to proceed.  History of Present Illness   The patient, a Scientist, forensic (CMA) working in infectious disease, presented with a sudden onset of a painful growth on the foot, suspected to be a bunion.  The growth appeared approximately two weeks prior to the consultation and has since caused significant discomfort, particularly when wearing shoes. The patient  reported that the growth has affected her ability to wear her usual footwear, including sneakers, which is a requirement for her work environment.  The patient has attempted to manage the discomfort with over-the-counter bunion pads and spacers, but found these to be ineffective. She expressed a strong desire to avoid invasive treatments such as cortisone injections and surgery, and instead expressed interest in exploring alternative management strategies such as orthotics, taping techniques, and physical therapy exercises.  The patient also mentioned a history of high cholesterol and triglycerides, and is currently engaged in a weight loss program through [employer]. She reported a recent weight loss of approximately four pounds. The patient has a preference for healthy eating, but struggles with a fondness for sweets.  In addition to the foot discomfort, the patient reported occasional knee problems, for which she has previously used Pennsaid, a topical nonsteroidal anti-inflammatory drug (NSAID). The patient has a history of adverse reactions to certain medications, including metformin and Topamax, which were previously prescribed for weight loss. The patient expressed a reluctance to use Celebrex due to a personal connection's adverse experience with the medication.  The patient is currently insured through [employer], following the death of her spouse on active military duty. She expressed frustration with the limitations of her insurance coverage, particularly in relation to weight loss medications.      She has a past medical history of Abnormal mammogram (05/17/2020), Allergy, Bereavement, uncomplicated (05/17/2020), Chronic sinusitis (12/23/2018), Degenerative disc disease, cervical (11/07/2021), Depression, Endometriosis, Fatty liver  (11/07/2021), Gastritis (07/11/2016), GERD (gastroesophageal reflux disease), Hyperlipidemia, Hypertension, Hypogammaglobulinemia (HCC), Lumbar degenerative disc disease (11/07/2021), Palmar erythema (11/07/2021), Panic attacks, Raised TSH level (10/09/2016), Sialadenitis (02/09/2018), Sinusitis, Stress incontinence (05/17/2020), Upper back pain on left side (12/27/2019), Vitamin B12 deficiency (12/23/2018), and Vitamin D deficiency (12/23/2018).  Problem list overviews that were updated at today's visit: No problems updated. Current Outpatient Medications on File Prior to Visit  Medication Sig   antiseptic oral rinse (BIOTENE) LIQD 15 mLs by Mouth Rinse route as needed for dry mouth.   cetirizine (ZYRTEC) 10 MG tablet as needed.   cholecalciferol (VITAMIN D3) 25 MCG (1000 UNIT) tablet Take 1 tablet (1,000 Units total) by mouth daily.   clonazePAM (KLONOPIN) 0.5 MG tablet Take 1 tablet by mouth 3 times a day as needed   cyanocobalamin (VITAMIN B12) 1000 MCG tablet Take 1,000 mcg by mouth daily.   diphenhydrAMINE (BENADRYL) 12.5 MG/5ML elixir Take by mouth every 14 (fourteen) days. Takes for infusions.   EPINEPHrine 0.3 mg/0.3 mL IJ SOAJ injection Inject into the muscle.   famotidine (PEPCID) 40 MG tablet Take 1 tablet (40 mg total) by mouth at bedtime.   famotidine-calcium carbonate-magnesium hydroxide (PEPCID COMPLETE) 10-800-165 MG chewable tablet Chew 1 tablet by mouth 2 (two) times daily as needed.   hydrochlorothiazide (HYDRODIURIL) 12.5 MG tablet Take 1 tablet (12.5 mg total) by mouth daily.   Immune Globulin, Human,-klhw (XEMBIFY) 10 GM/50ML SOLN Inject 20 g into the skin every 14 (fourteen) days.   loratadine (CLARITIN) 10 MG tablet Take 1 tablet (10 mg total) by mouth 2 (two) times daily.   propranolol ER (INDERAL LA) 60 MG 24 hr capsule Take 1 capsule (60 mg total) by mouth daily.   Propylene Glycol (SYSTANE COMPLETE) 0.6 % SOLN Apply 1 drop to eye 4 (four) times daily as needed.    rosuvastatin (CRESTOR) 20 MG tablet Take 1 tablet (20 mg total) by mouth daily.   sertraline (ZOLOFT) 100 MG tablet Take 2 tablets by mouth  daily   No current facility-administered medications on file prior to visit.  There are no discontinued medications.   Objective   Physical Exam  BP 90/64 (BP Location: Right Arm, Patient Position: Sitting)   Pulse 61   Temp 97.9 F (36.6 C) (Temporal)   Ht 5' (1.524 m)   Wt 158 lb 3.2 oz (71.8 kg)   SpO2 94%   BMI 30.90 kg/m  Wt Readings from Last 10 Encounters:  10/30/22 158 lb 3.2 oz (71.8 kg)  08/26/22 162 lb 6.4 oz (73.7 kg)  05/13/22 162 lb 6.4 oz (73.7 kg)  05/06/22 159 lb 6.4 oz (72.3 kg)  05/02/22 160 lb 3.2 oz (72.7 kg)  02/28/22 159 lb 9.6 oz (72.4 kg)  02/19/22 158 lb (71.7 kg)  02/07/22 156 lb (70.8 kg)  12/02/21 158 lb 6.4 oz (71.8 kg)  11/07/21 159 lb 9.6 oz (72.4 kg)   Vital signs reviewed.  Nursing notes reviewed. Weight trend reviewed. Abnormalities and Problem-Specific physical exam findings:  tender nodule on toe as imaged: Photographs Taken 10/30/2022 :    She is able to walk on without limp but its very painful.  General Appearance:  No acute distress appreciable.   Well-groomed, healthy-appearing female.  Well proportioned with no abnormal fat distribution.  Good muscle tone. Pulmonary:  Normal work of breathing at rest, no respiratory distress apparent. SpO2: 94 %  Musculoskeletal: All extremities are intact.  Neurological:  Awake, alert, oriented, and engaged.  No obvious focal neurological deficits or cognitive impairments.  Sensorium seems unclouded.   Speech is clear and coherent with logical content. Psychiatric:  Appropriate mood, pleasant and cooperative demeanor, thoughtful and engaged during the exam  Results            No results found for any visits on 10/30/22.  Office Visit on 08/26/2022  Component Date Value   WBC 08/26/2022 6.9    RBC 08/26/2022 4.76    Hemoglobin 08/26/2022 14.0    HCT  08/26/2022 42.8    MCV 08/26/2022 90.0    MCHC 08/26/2022 32.7    RDW 08/26/2022 13.6    Platelets 08/26/2022 122.0 (L)    Neutrophils Relative % 08/26/2022 63.9    Lymphocytes Relative 08/26/2022 26.4    Monocytes Relative 08/26/2022 5.2    Eosinophils Relative 08/26/2022 4.0    Basophils Relative 08/26/2022 0.5    Neutro Abs 08/26/2022 4.4    Lymphs Abs 08/26/2022 1.8    Monocytes Absolute 08/26/2022 0.4    Eosinophils Absolute 08/26/2022 0.3    Basophils Absolute 08/26/2022 0.0    Sodium 08/26/2022 143    Potassium 08/26/2022 4.4    Chloride 08/26/2022 107    CO2 08/26/2022 26    Glucose, Bld 08/26/2022 91    BUN 08/26/2022 15    Creatinine, Ser 08/26/2022 0.74    Total Bilirubin 08/26/2022 0.6    Alkaline Phosphatase 08/26/2022 57    AST 08/26/2022 17    ALT 08/26/2022 10    Total Protein 08/26/2022 7.5    Albumin 08/26/2022 4.9    GFR 08/26/2022 89.96    Calcium 08/26/2022 10.5    Cholesterol 08/26/2022 175    Triglycerides 08/26/2022 128.0    HDL 08/26/2022 55.50    VLDL 08/26/2022 25.6    LDL Cholesterol 08/26/2022 94    Total CHOL/HDL Ratio 08/26/2022 3    NonHDL 08/26/2022 119.49    Hgb A1c MFr Bld 08/26/2022 5.4    Microalb, Ur 08/26/2022 <0.7    Creatinine,U 08/26/2022  44.0    Microalb Creat Ratio 08/26/2022 1.6    Vitamin B-12 08/26/2022 502    Folate 08/26/2022 7.1    VITD 08/26/2022 46.69    A CHR BINDING ABS 08/26/2022 <0.30    ACHR Blocking Abs 08/26/2022 <15    Acetylchol Modul Ab 08/26/2022 29    Lyme Total Antibody EIA 08/26/2022 Negative   Office Visit on 05/02/2022  Component Date Value   Cholesterol 05/02/2022 169    Triglycerides 05/02/2022 113.0    HDL 05/02/2022 45.50    VLDL 05/02/2022 22.6    LDL Cholesterol 05/02/2022 101 (H)    Total CHOL/HDL Ratio 05/02/2022 4    NonHDL 05/02/2022 123.29    TSH 05/02/2022 2.89    Sodium 05/02/2022 141    Potassium 05/02/2022 4.3    Chloride 05/02/2022 108    CO2 05/02/2022 25    Glucose, Bld  05/02/2022 90    BUN 05/02/2022 12    Creatinine, Ser 05/02/2022 0.71    Total Bilirubin 05/02/2022 0.7    Alkaline Phosphatase 05/02/2022 79    AST 05/02/2022 17    ALT 05/02/2022 11    Total Protein 05/02/2022 7.0    Albumin 05/02/2022 4.6    GFR 05/02/2022 94.75    Calcium 05/02/2022 9.9    WBC 05/02/2022 6.6    RBC 05/02/2022 4.93    Hemoglobin 05/02/2022 14.8    HCT 05/02/2022 44.0    MCV 05/02/2022 89.1    MCHC 05/02/2022 33.6    RDW 05/02/2022 13.9    Platelets 05/02/2022 117.0 (L)    Neutrophils Relative % 05/02/2022 62.3    Lymphocytes Relative 05/02/2022 28.7    Monocytes Relative 05/02/2022 4.7    Eosinophils Relative 05/02/2022 3.6    Basophils Relative 05/02/2022 0.7    Neutro Abs 05/02/2022 4.1    Lymphs Abs 05/02/2022 1.9    Monocytes Absolute 05/02/2022 0.3    Eosinophils Absolute 05/02/2022 0.2    Basophils Absolute 05/02/2022 0.0    VITD 05/02/2022 25.92 (L)    Hgb A1c MFr Bld 05/02/2022 5.2    Vitamin B-12 05/02/2022 275    Folate 05/02/2022 5.7 (L)    Total CK 05/02/2022 159    IgG (Immunoglobin G), Se* 05/02/2022 998    Free T4 05/02/2022 0.85    Intrinsic Factor 05/02/2022 Negative    Methylmalonic Acid, Quant 05/02/2022 116    RPR Ser Ql 05/02/2022 NON-REACTIVE    Lyme Total Antibody EIA 05/02/2022 CANCELED    HIV 1&2 Ab, 4th Generati* 05/02/2022 NON-REACTIVE   Office Visit on 02/19/2022  Component Date Value   Sed Rate 02/19/2022 6    CRP 02/19/2022 0.4    Ribonucleic Protein(ENA)* 02/19/2022 <1.0 NEG    SSA (Ro) (ENA) Antibody,* 02/19/2022 <1.0 NEG    SSB (La) (ENA) Antibody,* 02/19/2022 <1.0 NEG    C3 Complement 02/19/2022 140    C4 Complement 02/19/2022 46    Rheumatoid fact SerPl-aC* 02/19/2022 <14   Admission on 12/02/2021, Discharged on 12/02/2021  Component Date Value   Sodium 12/02/2021 140    Potassium 12/02/2021 4.3    Chloride 12/02/2021 106    CO2 12/02/2021 28    Glucose, Bld 12/02/2021 92    BUN 12/02/2021 10     Creatinine, Ser 12/02/2021 0.77    Calcium 12/02/2021 10.1    GFR, Estimated 12/02/2021 >60    Anion gap 12/02/2021 6    WBC 12/02/2021 6.5    RBC 12/02/2021 4.93    Hemoglobin 12/02/2021 14.7  HCT 12/02/2021 44.7    MCV 12/02/2021 90.7    MCH 12/02/2021 29.8    MCHC 12/02/2021 32.9    RDW 12/02/2021 13.0    Platelets 12/02/2021 114 (L)    nRBC 12/02/2021 0.0    Troponin I (High Sensiti* 12/02/2021 3    Preg Test, Ur 12/02/2021 NEGATIVE    D-Dimer, Quant 12/02/2021 0.33    Troponin I (High Sensiti* 12/02/2021 2   Office Visit on 11/07/2021  Component Date Value   Cholesterol 11/07/2021 282 (H)    HDL 11/07/2021 42 (L)    Triglycerides 11/07/2021 208 (H)    LDL Cholesterol (Calc) 11/07/2021 201 (H)    Total CHOL/HDL Ratio 11/07/2021 6.7 (H)    Non-HDL Cholesterol (Cal* 11/07/2021 240 (H)    Glucose, Bld 11/07/2021 89    BUN 11/07/2021 15    Creat 11/07/2021 0.77    eGFR 11/07/2021 90    BUN/Creatinine Ratio 11/07/2021 SEE NOTE:    Sodium 11/07/2021 139    Potassium 11/07/2021 4.2    Chloride 11/07/2021 106    CO2 11/07/2021 26    Calcium 11/07/2021 10.0    Total Protein 11/07/2021 7.2    Albumin 11/07/2021 4.6    Globulin 11/07/2021 2.6    AG Ratio 11/07/2021 1.8    Total Bilirubin 11/07/2021 0.5    Alkaline phosphatase (AP* 11/07/2021 67    AST 11/07/2021 14    ALT 11/07/2021 8    WBC 11/07/2021 5.3    RBC 11/07/2021 4.77    Hemoglobin 11/07/2021 14.7    HCT 11/07/2021 43.0    MCV 11/07/2021 90.1    MCH 11/07/2021 30.8    MCHC 11/07/2021 34.2    RDW 11/07/2021 12.6    Platelets 11/07/2021 132 (L)    MPV 11/07/2021 12.8 (H)    Neutro Abs 11/07/2021 3,138    Lymphs Abs 11/07/2021 1,701    Absolute Monocytes 11/07/2021 270    Eosinophils Absolute 11/07/2021 148    Basophils Absolute 11/07/2021 42    Neutrophils Relative % 11/07/2021 59.2    Total Lymphocyte 11/07/2021 32.1    Monocytes Relative 11/07/2021 5.1    Eosinophils Relative 11/07/2021 2.8     Basophils Relative 11/07/2021 0.8    Fibrosis Score 11/07/2021 0.05    Fibrosis Stage 11/07/2021 Comment    Steatosis Score 11/07/2021 0.45 (H)    Steatosis Grade 11/07/2021 Comment    NASH Score 11/07/2021 0.21    NASH Grade 11/07/2021 Comment    Methodology: 11/07/2021 Comment    ALPHA 2-MACROGLOBULINS, * 11/07/2021 140    Haptoglobin 11/07/2021 140    Apolipoprotein A-1 11/07/2021 131    Bilirubin, Total 11/07/2021 0.3    GGT 11/07/2021 9    ALT (SGPT) P5P 11/07/2021 11    AST (SGOT) P5P 11/07/2021 20    Cholesterol, Total 11/07/2021 284 (H)    Glucose 11/07/2021 93    Triglycerides 11/07/2021 215 (H)    Interpretations: 11/07/2021 Comment    Fibrosis Scoring: 11/07/2021 Comment    Steatosis Scoring 11/07/2021 Comment    NASH Scoring 11/07/2021 Comment    Limitations: 11/07/2021 Comment    Comment: 11/07/2021 Comment    Mitochondrial M2 Ab, IgG 11/07/2021 <=20.0    LKM1 Ab 11/07/2021 <=20.0    Vitamin B-12 11/07/2021 632    Vit D, 25-Hydroxy 11/07/2021 30    No image results found.   No results found.  MM 3D SCREEN BREAST BILATERAL  Result Date: 02/07/2022 CLINICAL DATA:  Screening. EXAM: DIGITAL SCREENING BILATERAL  MAMMOGRAM WITH TOMOSYNTHESIS AND CAD TECHNIQUE: Bilateral screening digital craniocaudal and mediolateral oblique mammograms were obtained. Bilateral screening digital breast tomosynthesis was performed. The images were evaluated with computer-aided detection. COMPARISON:  Previous exam(s). ACR Breast Density Category c: The breast tissue is heterogeneously dense, which may obscure small masses. FINDINGS: There are no findings suspicious for malignancy. IMPRESSION: No mammographic evidence of malignancy. A result letter of this screening mammogram will be mailed directly to the patient. RECOMMENDATION: Screening mammogram in one year. (Code:SM-B-01Y) BI-RADS CATEGORY  1: Negative. Electronically Signed   By: Bary Richard M.D.   On: 02/07/2022 12:56        Additional Info: This encounter employed real-time, collaborative documentation. The patient actively reviewed and updated their medical record on a shared screen, ensuring transparency and facilitating joint problem-solving for the problem list, overview, and plan. This approach promotes accurate, informed care. The treatment plan was discussed and reviewed in detail, including medication safety, potential side effects, and all patient questions. We confirmed understanding and comfort with the plan. Follow-up instructions were established, including contacting the office for any concerns, returning if symptoms worsen, persist, or new symptoms develop, and precautions for potential emergency department visits.

## 2022-10-30 NOTE — Patient Instructions (Addendum)
VISIT SUMMARY:  During your visit, we discussed your sudden onset of a painful growth on your foot, suspected to be a bunion, which has been causing you significant discomfort. We also discussed your history of high cholesterol and triglycerides, your ongoing weight loss efforts, and your occasional knee problems.  YOUR PLAN:  -BUNION: A bunion is a bony bump that forms on the joint at the base of your big toe. We discussed conservative management strategies to help alleviate your discomfort. This includes footwear modifications, toe exercises, and the use of over-the-counter bunion pads and spacers. We also recommended the use of topical nonsteroidal anti-inflammatory drugs (NSAIDs) like Voltaren and a short course of oral NSAIDs, such as Toradol, for pain management. We will follow up in 4-6 weeks to evaluate your response to these strategies.  -WEIGHT MANAGEMENT: We discussed your successful weight loss through [employer] and your interest in further weight loss strategies. We encourage you to continue your healthy dietary habits, specifically replacing sweets with protein and low/no sugar alternatives. We will consider a follow-up to monitor your weight loss progress and discuss further management options if necessary.  INSTRUCTIONS:  Please continue with the recommended conservative management strategies for your bunion and maintain your healthy dietary habits for weight management. We will follow up in 4-6 weeks to evaluate your response to the bunion management strategies and to monitor your weight loss progress.   # Bunion Management Plan: After-Visit Summary   Thank you for your visit today regarding the management of your bunion (hallux valgus). As per your request, we've developed a comprehensive, non-surgical management plan. Please find below a detailed summary of our discussion and recommendations.  ## 1. Medications  a) Topical NSAIDs:    - Pennsaid (diclofenac sodium  topical solution 2%): Apply 40 drops to the affected area 2 times daily    - Voltaren Gel (diclofenac sodium topical gel 1%): Apply 2-4 grams to the affected area 4 times daily  b) Oral NSAID:    - Toradol (ketorolac) 10mg  tablets: Take 1 tablet every 4-6 hours as needed for pain, not to exceed 5 days of use  Always take medications as prescribed and inform us of any side effects.  ## 2. Physical Therapy and Home Exercises  Perform these exercises 2-3 times daily, 10-15 repetitions each:  a) Toe Flexion and Extension:    - Sit with your foot flat on the floor    - Lift your toes up, then curl them down    - Focus on moving your big toe through its full range of motion  b) Toe Spreading:    - Sit with your foot flat on the floor    - Spread your toes apart as far as comfortable    - Hold for 5 seconds, then relax  c) Towel Curls:    - Sit with your foot on a towel on the floor    - Use your toes to scrunch the towel towards you    - Repeat, moving the towel away from you  d) Marble Pickup:    - Scatter 10-15 marbles on the floor    - Use your toes to pick up each marble and place it in a bowl  e) Calf Stretches:    - Stand facing a wall, place hands on the wall at shoulder height    - Step one foot back, keeping the back leg straight and heel on the ground    - Lean forward, bending the  front knee until you feel a stretch in the back calf    - Hold for 30 seconds, repeat 3 times on each leg  f) Plantar Fascia Stretch:    - Sit with one foot across your opposite thigh    - Grasp your toes and gently pull them back towards your shin    - Hold for 30 seconds, repeat 3 times on each foot  g) Intrinsic Foot Muscle Strengthening:    - Place a tennis ball under your foot    - Roll the ball under your foot, focusing on the arch area    - Apply moderate pressure for 2-3 minutes per foot  ## 3. Ice Therapy  Apply ice to the bunion area for 15-20 minutes, 2-3 times daily,  especially after activity or exercises. Use a thin towel between the ice pack and your skin to prevent ice burns.  ## 4. Taping Technique  a) Cut a strip of athletic tape long enough to wrap around your foot b) Start at the base of your big toe, pulling it gently towards your second toe c) Wrap the tape under your foot and up the opposite side d) Repeat with 2-3 strips for added support e) Be careful not to tape too tightly, which could restrict circulation  ## 5. Footwear Recommendations  - Choose shoes with a wide, soft toe box and low heels (less than 2 inches) - Look for shoes made of stretchy materials to accommodate the bunion - Avoid narrow, pointed shoes and high heels  ## 6. Orthotics and Padding  - Use the prescribed custom orthotic devices in your shoes to help redistribute pressure - Consider using over-the-counter bunion pads or spacers for additional comfort  ## 7. Lifestyle Modifications  - Maintain a healthy weight to reduce pressure on your feet - Consider low-impact activities like swimming or cycling to reduce stress on your feet - Practice good foot hygiene and inspect your feet regularly  ## 8. When to Consider Further Interventions  Please schedule a follow-up appointment if: - Pain persists or worsens despite following this management plan for 4-6 weeks - You notice increased redness, swelling, or warmth around the bunion - The bunion appears to be rapidly progressing or affecting your daily activities significantly  At that time, we may discuss: - Referral to a podiatrist for specialized care - Reconsideration of injections (e.g., corticosteroids) for short-term pain relief - Potential surgical options if conservative measures fail to provide adequate relief  Remember, while you currently wish to avoid injections, they may be reconsidered if other treatments prove insufficient. Injections can provide temporary relief and may help delay the need for  surgery in some cases.  ## 9. Additional Resources  For more information on bunion management and foot health, visit: - [employer]: www.apma.org - [employer]: www.aaos.org  Please don't hesitate to contact our office if you have any questions or concerns about your bunion management plan. Your active participation in this conservative treatment approach is crucial for its success.

## 2022-11-04 ENCOUNTER — Encounter: Payer: Self-pay | Admitting: Internal Medicine

## 2022-11-05 ENCOUNTER — Encounter: Payer: Self-pay | Admitting: Internal Medicine

## 2022-11-05 ENCOUNTER — Ambulatory Visit: Admitting: Internal Medicine

## 2022-11-05 ENCOUNTER — Ambulatory Visit (HOSPITAL_COMMUNITY)
Admission: RE | Admit: 2022-11-05 | Discharge: 2022-11-05 | Disposition: A | Discharge status: Home / Self Care | Source: Ambulatory Visit | Attending: Internal Medicine | Admitting: Internal Medicine

## 2022-11-05 VITALS — BP 102/62 | HR 98 | Temp 98.3°F | Ht 60.0 in | Wt 158.0 lb

## 2022-11-05 DIAGNOSIS — R1013 Epigastric pain: Secondary | ICD-10-CM

## 2022-11-05 DIAGNOSIS — R110 Nausea: Secondary | ICD-10-CM | POA: Diagnosis present

## 2022-11-05 DIAGNOSIS — E860 Dehydration: Secondary | ICD-10-CM

## 2022-11-05 DIAGNOSIS — K859 Acute pancreatitis without necrosis or infection, unspecified: Secondary | ICD-10-CM | POA: Insufficient documentation

## 2022-11-05 DIAGNOSIS — K853 Drug induced acute pancreatitis without necrosis or infection: Secondary | ICD-10-CM

## 2022-11-05 LAB — CBC WITH DIFFERENTIAL/PLATELET
Basophils Absolute: 0 10*3/uL (ref 0.0–0.1)
Basophils Relative: 0.8 % (ref 0.0–3.0)
Eosinophils Absolute: 0.3 10*3/uL (ref 0.0–0.7)
Eosinophils Relative: 4 % (ref 0.0–5.0)
HCT: 44.7 % (ref 36.0–46.0)
Hemoglobin: 14.6 g/dL (ref 12.0–15.0)
Lymphocytes Relative: 23.4 % (ref 12.0–46.0)
Lymphs Abs: 1.5 10*3/uL (ref 0.7–4.0)
MCHC: 32.7 g/dL (ref 30.0–36.0)
MCV: 90 fL (ref 78.0–100.0)
Monocytes Absolute: 0.3 10*3/uL (ref 0.1–1.0)
Monocytes Relative: 5.1 % (ref 3.0–12.0)
Neutro Abs: 4.4 10*3/uL (ref 1.4–7.7)
Neutrophils Relative %: 66.7 % (ref 43.0–77.0)
Platelets: 113 10*3/uL — ABNORMAL LOW (ref 150.0–400.0)
RBC: 4.97 Mil/uL (ref 3.87–5.11)
RDW: 13.5 % (ref 11.5–15.5)
WBC: 6.6 10*3/uL (ref 4.0–10.5)

## 2022-11-05 LAB — COMPREHENSIVE METABOLIC PANEL
ALT: 11 U/L (ref 0–35)
AST: 19 U/L (ref 0–37)
Albumin: 4.6 g/dL (ref 3.5–5.2)
Alkaline Phosphatase: 65 U/L (ref 39–117)
BUN: 15 mg/dL (ref 6–23)
CO2: 29 meq/L (ref 19–32)
Calcium: 10.2 mg/dL (ref 8.4–10.5)
Chloride: 104 meq/L (ref 96–112)
Creatinine, Ser: 0.79 mg/dL (ref 0.40–1.20)
GFR: 83.06 mL/min (ref >60.00)
Glucose, Bld: 83 mg/dL (ref 70–99)
Potassium: 3.7 meq/L (ref 3.5–5.1)
Sodium: 140 meq/L (ref 135–145)
Total Bilirubin: 0.6 mg/dL (ref 0.2–1.2)
Total Protein: 7.7 g/dL (ref 6.0–8.3)

## 2022-11-05 LAB — AMYLASE: Amylase: 66 U/L (ref 27–131)

## 2022-11-05 LAB — LIPASE: Lipase: 32 U/L (ref 11.0–59.0)

## 2022-11-05 MED ORDER — ESOMEPRAZOLE MAGNESIUM 40 MG PO CPDR
40.0000 mg | DELAYED_RELEASE_CAPSULE | Freq: Every day | ORAL | 3 refills | Status: DC
Start: 2022-11-05 — End: 2022-11-11

## 2022-11-05 MED ORDER — SCOPOLAMINE 1 MG/3DAYS TD PT72SCOPOLAMINE 1 MG/3DAYS
1.0000 | MEDICATED_PATCH | TRANSDERMAL | 2 refills | Status: DC
Start: 2022-11-05 — End: 2023-04-14

## 2022-11-05 MED ORDER — IOHEXOL 300 MG/ML  SOLN
100.0000 mL | Freq: Once | INTRAMUSCULAR | Status: AC | PRN
  Administered 2022-11-05: 100 mL via INTRAVENOUS

## 2022-11-05 MED ORDER — PROMETHAZINE HCL 25 MG PO TABS
25.0000 mg | ORAL_TABLET | Freq: Three times a day (TID) | ORAL | 0 refills | Status: DC | PRN
Start: 2022-11-05 — End: 2023-04-14

## 2022-11-05 NOTE — Progress Notes (Signed)
[facility]: [phone]   -- Medical Office Visit --  Patient:  Pamela Waller      Age: 57 y.o.       Sex:  female  Date:   11/05/2022 Patient Care Team: Lula Olszewski, MD as PCP - General (Internal Medicine) Lucie Leather Alvira Philips, MD as Consulting Physician (Allergy and Immunology) Ellis Savage, NP as Nurse Practitioner Luciano Cutter, MD as Consulting Physician (Pulmonary Disease) Anson Fret, MD as Consulting Physician (Neurology) Today's Healthcare Provider: Lula Olszewski, MD   Assessment & Plan Epigastric pain  Acute pancreatitis without infection or necrosis, unspecified pancreatitis type She presents with epigastric pain radiating to the back, nausea, vomiting, and a history of similar episodes, suggesting recurrent pancreatitis. Possible etiologies include gallstones, medication-induced by hydrochlorothiazide, or a congenital anomaly. We will order CBC, CMP, lipase, amylase, and a CT scan to confirm the diagnosis and evaluate for complications. Hydrochlorothiazide will be discontinued permanently due to its potential association with pancreatitis. For pain management, we will start Nexium and Toradol. She is advised to follow a clear liquid diet, progressing to a low-fat diet as tolerated. A referral to GI for further evaluation and possible ERCP will be considered. She should monitor vitals and urine output at home and go to the ER if the condition worsens or if she develops a fever. Nausea  Dehydration She exhibits low blood pressure and dry mouth, suggestive of dehydration secondary to vomiting and decreased oral intake. We advise her to hydrate with liquid IV, Gatorade, or popsicles. She should monitor blood pressure and urine output at home. If unable to maintain hydration or if blood pressure remains low, she is advised to go to the ER.    Recommended follow-up: emergency room .[facility] as soon as possible.  Counseled on this xtvly check AVS.   Future Appointments  Date Time Provider Department Center  11/05/2022  5:00 PM [facility] 1 [facility] [place]  11/11/2022  1:30 PM Kozlow, Alvira Philips, MD [facility] None  11/20/2022  9:30 AM Lomax, Amy, NP [facility] None        Subjective   57 y.o. female who has Generalized anxiety disorder with panic attacks; BRCA2 gene mutation positive; Fatigue; Hypertension; Hyperlipidemia LDL goal <100; Hypogammaglobulinemia (HCC); Idiopathic peripheral neuropathy; Myalgia; Nicotine dependence; Recurrent major depressive disorder, in partial remission (HCC); Chronic sinusitis; Vitamin D deficiency; B12 deficiency; Hypersomnolence; Allergic rhinitis due to pollen; Disorder of vision; Fibroadenosis of breast; Neuropathy; Thrombocytopenic disorder (HCC); Snoring; Tobacco use disorder; Insomnia; Memory changes; Dry cough; Family history of autoimmune disorder; Dry mouth and eyes; Palmar erythema; Fatty liver; Degenerative disc disease, cervical; Lumbar degenerative disc disease; Pleuritic pain; Bradycardia; OSA (obstructive sleep apnea); Vertigo; Long COVID; Obesity due to energy imbalance; Myopathy; CVID (common variable immunodeficiency) (HCC); Laryngopharyngeal reflux (LPR); and Leg mass on their problem list. Her reasons/main concerns/chief complaints for today's office visit are Hypertension, Hyperlipidemia, Abdominal Pain (Pt has had epigastric pain since Saturday, pain is starting to resolve, but pain has radiated now to her back. Has not tried any pain medication), Emesis (Pt c/o of vomiting that started last night. Has taken Phenergan to help), and Sleep Apnea   ------------------------------------------------------------------------------------------------------------------------ AI-Extracted: Discussed the use of AI scribe software for clinical note transcription with the patient, who gave verbal consent to proceed.  History of Present Illness   The patient, with a history of recurrent pancreatitis, presents with a  chief complaint of epigastric discomfort, which she suspects to be another episode of pancreatitis.  The discomfort is localized to the epigastric region, with no radiation to the left or right. The patient reports that the pain has been subsiding, but she woke up with some discomfort on the day of the consultation. The pain was also noted to radiate to the back, a symptom consistent with pancreatitis.  The patient has a history of negative HIDA scans and ultrasounds, suggesting no gallstones. However, she has been on hydrochlorothiazide, a medication known to potentially cause pancreatitis. The patient also reports episodes of vomiting, which started the previous evening. Despite the discomfort, the patient has been trying to manage her diet, sticking to clear liquids and low-fat foods.  The patient has been taking Phenergan for nausea, but reports that it only makes her tired. She has also been experiencing loose, discolored stools. The patient's blood pressure has been noted to be low, which she attributes to dehydration. Despite the discomfort and nausea, the patient has been able to produce urine, indicating some level of hydration.  The patient works part-time in a [employer], trade [employer] and has expressed a preference for outpatient management of her condition, citing previous negative experiences with emergency departments.      She has a past medical history of Abnormal mammogram (05/17/2020), Allergy, Bereavement, uncomplicated (05/17/2020), Chronic sinusitis (12/23/2018), Degenerative disc disease, cervical (11/07/2021), Depression, Endometriosis, Fatty liver (11/07/2021), Gastritis (07/11/2016), GERD (gastroesophageal reflux disease), Hyperlipidemia, Hypertension, Hypogammaglobulinemia (HCC), Lumbar degenerative disc disease (11/07/2021), Palmar erythema (11/07/2021), Panic attacks, Raised TSH level (10/09/2016), Sialadenitis (02/09/2018), Sinusitis, Stress incontinence (05/17/2020), Upper back pain on  left side (12/27/2019), Vitamin B12 deficiency (12/23/2018), and Vitamin D deficiency (12/23/2018).  Problem list overviews that were updated at today's visit: No problems updated. Current Outpatient Medications on File Prior to Visit  Medication Sig   antiseptic oral rinse (BIOTENE) LIQD 15 mLs by Mouth Rinse route as needed for dry mouth.   cetirizine (ZYRTEC) 10 MG tablet as needed.   cholecalciferol (VITAMIN D3) 25 MCG (1000 UNIT) tablet Take 1 tablet (1,000 Units total) by mouth daily.   clonazePAM (KLONOPIN) 0.5 MG tablet Take 1 tablet by mouth 3 times a day as needed   cyanocobalamin (VITAMIN B12) 1000 MCG tablet Take 1,000 mcg by mouth daily.   diphenhydrAMINE (BENADRYL) 12.5 MG/5ML elixir Take by mouth every 14 (fourteen) days. Takes for infusions.   EPINEPHrine 0.3 mg/0.3 mL IJ SOAJ injection Inject into the muscle.   famotidine (PEPCID) 40 MG tablet Take 1 tablet (40 mg total) by mouth at bedtime.   famotidine-calcium carbonate-magnesium hydroxide (PEPCID COMPLETE) 10-800-165 MG chewable tablet Chew 1 tablet by mouth 2 (two) times daily as needed.   Immune Globulin, Human,-klhw (XEMBIFY) 10 GM/50ML SOLN Inject 20 g into the skin every 14 (fourteen) days.   ketorolac (TORADOL) 10 MG tablet Take 1 tablet (10 mg total) by mouth every 6 (six) hours as needed.   loratadine (CLARITIN) 10 MG tablet Take 1 tablet (10 mg total) by mouth 2 (two) times daily.   propranolol ER (INDERAL LA) 60 MG 24 hr capsule Take 1 capsule (60 mg total) by mouth daily.   Propylene Glycol (SYSTANE COMPLETE) 0.6 % SOLN Apply 1 drop to eye 4 (four) times daily as needed.   rosuvastatin (CRESTOR) 20 MG tablet Take 1 tablet (20 mg total) by mouth daily.   sertraline (ZOLOFT) 100 MG tablet Take 2 tablets by mouth daily   No current facility-administered medications on file prior to visit.   Medications Discontinued During This Encounter  Medication Reason   hydrochlorothiazide (HYDRODIURIL)  12.5 MG tablet Side  effect (s)     Objective   Physical Exam  BP 102/62   Pulse 98   Temp 98.3 F (36.8 C)   Ht 5' (1.524 m)   Wt 158 lb (71.7 kg)   SpO2 98%   BMI 30.86 kg/m  Wt Readings from Last 10 Encounters:  11/05/22 158 lb (71.7 kg)  10/30/22 158 lb 3.2 oz (71.8 kg)  08/26/22 162 lb 6.4 oz (73.7 kg)  05/13/22 162 lb 6.4 oz (73.7 kg)  05/06/22 159 lb 6.4 oz (72.3 kg)  05/02/22 160 lb 3.2 oz (72.7 kg)  02/28/22 159 lb 9.6 oz (72.4 kg)  02/19/22 158 lb (71.7 kg)  02/07/22 156 lb (70.8 kg)  12/02/21 158 lb 6.4 oz (71.8 kg)   Vital signs reviewed.  Nursing notes reviewed. Weight trend reviewed. Abnormalities and Problem-Specific physical exam findings:  frequent belches, obvious abdomen discomfort, dry mouth.  General Appearance:  No acute distress appreciable.   Well-groomed, healthy-appearing female.  Well proportioned with no abnormal fat distribution.  Good muscle tone. Pulmonary:  Normal work of breathing at rest, no respiratory distress apparent. SpO2: 98 %  Musculoskeletal: All extremities are intact.  Neurological:  Awake, alert, oriented, and engaged.  No obvious focal neurological deficits or cognitive impairments.  Sensorium seems unclouded.   Speech is clear and coherent with logical content. Psychiatric:  Appropriate mood, pleasant and cooperative demeanor, thoughtful and engaged during the exam  Results   LABS Amylase: elevated Lipase: elevated        Results for orders placed or performed in visit on 11/05/22  Amylase  Result Value Ref Range   Amylase 66 27 - 131 U/L  Lipase  Result Value Ref Range   Lipase 32.0 11.0 - 59.0 U/L  CBC with Differential/Platelet  Result Value Ref Range   WBC 6.6 4.0 - 10.5 K/uL   RBC 4.97 3.87 - 5.11 Mil/uL   Hemoglobin 14.6 12.0 - 15.0 g/dL   HCT 11.9 14.7 - 82.9 %   MCV 90.0 78.0 - 100.0 fl   MCHC 32.7 30.0 - 36.0 g/dL   RDW 56.2 13.0 - 86.5 %   Platelets 113.0 (L) 150.0 - 400.0 K/uL   Neutrophils Relative % 66.7 43.0 - 77.0 %    Lymphocytes Relative 23.4 12.0 - 46.0 %   Monocytes Relative 5.1 3.0 - 12.0 %   Eosinophils Relative 4.0 0.0 - 5.0 %   Basophils Relative 0.8 0.0 - 3.0 %   Neutro Abs 4.4 1.4 - 7.7 K/uL   Lymphs Abs 1.5 0.7 - 4.0 K/uL   Monocytes Absolute 0.3 0.1 - 1.0 K/uL   Eosinophils Absolute 0.3 0.0 - 0.7 K/uL   Basophils Absolute 0.0 0.0 - 0.1 K/uL  Comp Met (CMET)  Result Value Ref Range   Sodium 140 135 - 145 mEq/L   Potassium 3.7 3.5 - 5.1 mEq/L   Chloride 104 96 - 112 mEq/L   CO2 29 19 - 32 mEq/L   Glucose, Bld 83 70 - 99 mg/dL   BUN 15 6 - 23 mg/dL   Creatinine, Ser 7.84 0.40 - 1.20 mg/dL   Total Bilirubin 0.6 0.2 - 1.2 mg/dL   Alkaline Phosphatase 65 39 - 117 U/L   AST 19 0 - 37 U/L   ALT 11 0 - 35 U/L   Total Protein 7.7 6.0 - 8.3 g/dL   Albumin 4.6 3.5 - 5.2 g/dL   GFR 69.62 >95.28 mL/min   Calcium 10.2  8.4 - 10.5 mg/dL    Office Visit on 04/54/0981  Component Date Value   Amylase 11/05/2022 66    Lipase 11/05/2022 32.0    WBC 11/05/2022 6.6    RBC 11/05/2022 4.97    Hemoglobin 11/05/2022 14.6    HCT 11/05/2022 44.7    MCV 11/05/2022 90.0    MCHC 11/05/2022 32.7    RDW 11/05/2022 13.5    Platelets 11/05/2022 113.0 (L)    Neutrophils Relative % 11/05/2022 66.7    Lymphocytes Relative 11/05/2022 23.4    Monocytes Relative 11/05/2022 5.1    Eosinophils Relative 11/05/2022 4.0    Basophils Relative 11/05/2022 0.8    Neutro Abs 11/05/2022 4.4    Lymphs Abs 11/05/2022 1.5    Monocytes Absolute 11/05/2022 0.3    Eosinophils Absolute 11/05/2022 0.3    Basophils Absolute 11/05/2022 0.0    Sodium 11/05/2022 140    Potassium 11/05/2022 3.7    Chloride 11/05/2022 104    CO2 11/05/2022 29    Glucose, Bld 11/05/2022 83    BUN 11/05/2022 15    Creatinine, Ser 11/05/2022 0.79    Total Bilirubin 11/05/2022 0.6    Alkaline Phosphatase 11/05/2022 65    AST 11/05/2022 19    ALT 11/05/2022 11    Total Protein 11/05/2022 7.7    Albumin 11/05/2022 4.6    GFR 11/05/2022 83.06     Calcium 11/05/2022 10.2   Office Visit on 08/26/2022  Component Date Value   WBC 08/26/2022 6.9    RBC 08/26/2022 4.76    Hemoglobin 08/26/2022 14.0    HCT 08/26/2022 42.8    MCV 08/26/2022 90.0    MCHC 08/26/2022 32.7    RDW 08/26/2022 13.6    Platelets 08/26/2022 122.0 (L)    Neutrophils Relative % 08/26/2022 63.9    Lymphocytes Relative 08/26/2022 26.4    Monocytes Relative 08/26/2022 5.2    Eosinophils Relative 08/26/2022 4.0    Basophils Relative 08/26/2022 0.5    Neutro Abs 08/26/2022 4.4    Lymphs Abs 08/26/2022 1.8    Monocytes Absolute 08/26/2022 0.4    Eosinophils Absolute 08/26/2022 0.3    Basophils Absolute 08/26/2022 0.0    Sodium 08/26/2022 143    Potassium 08/26/2022 4.4    Chloride 08/26/2022 107    CO2 08/26/2022 26    Glucose, Bld 08/26/2022 91    BUN 08/26/2022 15    Creatinine, Ser 08/26/2022 0.74    Total Bilirubin 08/26/2022 0.6    Alkaline Phosphatase 08/26/2022 57    AST 08/26/2022 17    ALT 08/26/2022 10    Total Protein 08/26/2022 7.5    Albumin 08/26/2022 4.9    GFR 08/26/2022 89.96    Calcium 08/26/2022 10.5    Cholesterol 08/26/2022 175    Triglycerides 08/26/2022 128.0    HDL 08/26/2022 55.50    VLDL 08/26/2022 25.6    LDL Cholesterol 08/26/2022 94    Total CHOL/HDL Ratio 08/26/2022 3    NonHDL 08/26/2022 119.49    Hgb A1c MFr Bld 08/26/2022 5.4    Microalb, Ur 08/26/2022 <0.7    Creatinine,U 08/26/2022 44.0    Microalb Creat Ratio 08/26/2022 1.6    Vitamin B-12 08/26/2022 502    Folate 08/26/2022 7.1    VITD 08/26/2022 46.69    A CHR BINDING ABS 08/26/2022 <0.30    ACHR Blocking Abs 08/26/2022 <15    Acetylchol Modul Ab 08/26/2022 29    Lyme Total Antibody EIA 08/26/2022 Negative   Office Visit on 05/02/2022  Component Date Value   Cholesterol 05/02/2022 169    Triglycerides 05/02/2022 113.0    HDL 05/02/2022 45.50    VLDL 05/02/2022 22.6    LDL Cholesterol 05/02/2022 101 (H)    Total CHOL/HDL Ratio 05/02/2022 4    NonHDL  05/02/2022 123.29    TSH 05/02/2022 2.89    Sodium 05/02/2022 141    Potassium 05/02/2022 4.3    Chloride 05/02/2022 108    CO2 05/02/2022 25    Glucose, Bld 05/02/2022 90    BUN 05/02/2022 12    Creatinine, Ser 05/02/2022 0.71    Total Bilirubin 05/02/2022 0.7    Alkaline Phosphatase 05/02/2022 79    AST 05/02/2022 17    ALT 05/02/2022 11    Total Protein 05/02/2022 7.0    Albumin 05/02/2022 4.6    GFR 05/02/2022 94.75    Calcium 05/02/2022 9.9    WBC 05/02/2022 6.6    RBC 05/02/2022 4.93    Hemoglobin 05/02/2022 14.8    HCT 05/02/2022 44.0    MCV 05/02/2022 89.1    MCHC 05/02/2022 33.6    RDW 05/02/2022 13.9    Platelets 05/02/2022 117.0 (L)    Neutrophils Relative % 05/02/2022 62.3    Lymphocytes Relative 05/02/2022 28.7    Monocytes Relative 05/02/2022 4.7    Eosinophils Relative 05/02/2022 3.6    Basophils Relative 05/02/2022 0.7    Neutro Abs 05/02/2022 4.1    Lymphs Abs 05/02/2022 1.9    Monocytes Absolute 05/02/2022 0.3    Eosinophils Absolute 05/02/2022 0.2    Basophils Absolute 05/02/2022 0.0    VITD 05/02/2022 25.92 (L)    Hgb A1c MFr Bld 05/02/2022 5.2    Vitamin B-12 05/02/2022 275    Folate 05/02/2022 5.7 (L)    Total CK 05/02/2022 159    IgG (Immunoglobin G), Se* 05/02/2022 998    Free T4 05/02/2022 0.85    Intrinsic Factor 05/02/2022 Negative    Methylmalonic Acid, Quant 05/02/2022 116    RPR Ser Ql 05/02/2022 NON-REACTIVE    Lyme Total Antibody EIA 05/02/2022 CANCELED    HIV 1&2 Ab, 4th Generati* 05/02/2022 NON-REACTIVE   Office Visit on 02/19/2022  Component Date Value   Sed Rate 02/19/2022 6    CRP 02/19/2022 0.4    Ribonucleic Protein(ENA)* 02/19/2022 <1.0 NEG    SSA (Ro) (ENA) Antibody,* 02/19/2022 <1.0 NEG    SSB (La) (ENA) Antibody,* 02/19/2022 <1.0 NEG    C3 Complement 02/19/2022 140    C4 Complement 02/19/2022 46    Rheumatoid fact SerPl-aC* 02/19/2022 <14   Admission on 12/02/2021, Discharged on 12/02/2021  Component Date Value    Sodium 12/02/2021 140    Potassium 12/02/2021 4.3    Chloride 12/02/2021 106    CO2 12/02/2021 28    Glucose, Bld 12/02/2021 92    BUN 12/02/2021 10    Creatinine, Ser 12/02/2021 0.77    Calcium 12/02/2021 10.1    GFR, Estimated 12/02/2021 >60    Anion gap 12/02/2021 6    WBC 12/02/2021 6.5    RBC 12/02/2021 4.93    Hemoglobin 12/02/2021 14.7    HCT 12/02/2021 44.7    MCV 12/02/2021 90.7    MCH 12/02/2021 29.8    MCHC 12/02/2021 32.9    RDW 12/02/2021 13.0    Platelets 12/02/2021 114 (L)    nRBC 12/02/2021 0.0    Troponin I (High Sensiti* 12/02/2021 3    Preg Test, Ur 12/02/2021 NEGATIVE    D-Dimer, Quant 12/02/2021 0.33  Troponin I (High Sensiti* 12/02/2021 2   Office Visit on 11/07/2021  Component Date Value   Cholesterol 11/07/2021 282 (H)    HDL 11/07/2021 42 (L)    Triglycerides 11/07/2021 208 (H)    LDL Cholesterol (Calc) 11/07/2021 201 (H)    Total CHOL/HDL Ratio 11/07/2021 6.7 (H)    Non-HDL Cholesterol (Cal* 11/07/2021 240 (H)    Glucose, Bld 11/07/2021 89    BUN 11/07/2021 15    Creat 11/07/2021 0.77    eGFR 11/07/2021 90    BUN/Creatinine Ratio 11/07/2021 SEE NOTE:    Sodium 11/07/2021 139    Potassium 11/07/2021 4.2    Chloride 11/07/2021 106    CO2 11/07/2021 26    Calcium 11/07/2021 10.0    Total Protein 11/07/2021 7.2    Albumin 11/07/2021 4.6    Globulin 11/07/2021 2.6    AG Ratio 11/07/2021 1.8    Total Bilirubin 11/07/2021 0.5    Alkaline phosphatase (AP* 11/07/2021 67    AST 11/07/2021 14    ALT 11/07/2021 8    WBC 11/07/2021 5.3    RBC 11/07/2021 4.77    Hemoglobin 11/07/2021 14.7    HCT 11/07/2021 43.0    MCV 11/07/2021 90.1    MCH 11/07/2021 30.8    MCHC 11/07/2021 34.2    RDW 11/07/2021 12.6    Platelets 11/07/2021 132 (L)    MPV 11/07/2021 12.8 (H)    Neutro Abs 11/07/2021 3,138    Lymphs Abs 11/07/2021 1,701    Absolute Monocytes 11/07/2021 270    Eosinophils Absolute 11/07/2021 148    Basophils Absolute 11/07/2021 42     Neutrophils Relative % 11/07/2021 59.2    Total Lymphocyte 11/07/2021 32.1    Monocytes Relative 11/07/2021 5.1    Eosinophils Relative 11/07/2021 2.8    Basophils Relative 11/07/2021 0.8    Fibrosis Score 11/07/2021 0.05    Fibrosis Stage 11/07/2021 Comment    Steatosis Score 11/07/2021 0.45 (H)    Steatosis Grade 11/07/2021 Comment    NASH Score 11/07/2021 0.21    NASH Grade 11/07/2021 Comment    Methodology: 11/07/2021 Comment    ALPHA 2-MACROGLOBULINS, * 11/07/2021 140    Haptoglobin 11/07/2021 140    Apolipoprotein A-1 11/07/2021 131    Bilirubin, Total 11/07/2021 0.3    GGT 11/07/2021 9    ALT (SGPT) P5P 11/07/2021 11    AST (SGOT) P5P 11/07/2021 20    Cholesterol, Total 11/07/2021 284 (H)    Glucose 11/07/2021 93    Triglycerides 11/07/2021 215 (H)    Interpretations: 11/07/2021 Comment    Fibrosis Scoring: 11/07/2021 Comment    Steatosis Scoring 11/07/2021 Comment    NASH Scoring 11/07/2021 Comment    Limitations: 11/07/2021 Comment    Comment: 11/07/2021 Comment    Mitochondrial M2 Ab, IgG 11/07/2021 <=20.0    LKM1 Ab 11/07/2021 <=20.0    Vitamin B-12 11/07/2021 632    Vit D, 25-Hydroxy 11/07/2021 30    No image results found.   No results found.  MM 3D SCREEN BREAST BILATERAL  Result Date: 02/07/2022 CLINICAL DATA:  Screening. EXAM: DIGITAL SCREENING BILATERAL MAMMOGRAM WITH TOMOSYNTHESIS AND CAD TECHNIQUE: Bilateral screening digital craniocaudal and mediolateral oblique mammograms were obtained. Bilateral screening digital breast tomosynthesis was performed. The images were evaluated with computer-aided detection. COMPARISON:  Previous exam(s). ACR Breast Density Category c: The breast tissue is heterogeneously dense, which may obscure small masses. FINDINGS: There are no findings suspicious for malignancy. IMPRESSION: No mammographic evidence of malignancy. A result letter  of this screening mammogram will be mailed directly to the patient. RECOMMENDATION: Screening  mammogram in one year. (Code:SM-B-01Y) BI-RADS CATEGORY  1: Negative. Electronically Signed   By: Bary Richard M.D.   On: 02/07/2022 12:56       Additional Info: This encounter employed real-time, collaborative documentation. The patient actively reviewed and updated their medical record on a shared screen, ensuring transparency and facilitating joint problem-solving for the problem list, overview, and plan. This approach promotes accurate, informed care. The treatment plan was discussed and reviewed in detail, including medication safety, potential side effects, and all patient questions. We confirmed understanding and comfort with the plan. Follow-up instructions were established, including contacting the office for any concerns, returning if symptoms worsen, persist, or new symptoms develop, and precautions for potential emergency department visits.

## 2022-11-05 NOTE — Patient Instructions (Addendum)
VISIT SUMMARY:  During your visit, we discussed your recurrent episodes of pancreatitis, which is inflammation of the pancreas, and your symptoms of epigastric discomfort, nausea, and vomiting. We also addressed your dehydration, which is a condition where your body doesn't have enough water.  YOUR PLAN:  -ACUTE PANCREATITIS: We will conduct several tests including blood tests and a CT scan to confirm the diagnosis and check for any complications. Your medication, hydrochlorothiazide, will be stopped as it may be causing the pancreatitis. We will start you on Nexium and Toradol for pain management. You should continue with a clear liquid diet and slowly introduce low-fat foods as tolerated. We may refer you to a GI specialist for further evaluation. If your condition worsens or you develop a fever, please go to the ER.  -DEHYDRATION: We recommend that you hydrate with liquid IV, Gatorade, or popsicles to address your dehydration. Please monitor your blood pressure and urine output at home. If you are unable to maintain hydration or if your blood pressure remains low, please go to the ER.  INSTRUCTIONS:  Please monitor your vitals and urine output at home. If your condition worsens, you develop a fever, are unable to maintain hydration, or if your blood pressure remains low, please go to the ER. Continue with a clear liquid diet and slowly introduce low-fat foods as tolerated.   # After Visit Summary: Acute Recurrent Pancreatitis Management  ## Patient Information - Diagnosis: Acute Recurrent Pancreatitis (ARP), suspected gallstone etiology or congenital anomaly - Current Status:    - BP: 102/62 (lower than usual)   - Urinary output: Decreased (urinated twice today)   - Vomiting: 4 times last night, none today   - Belching: Frequent  ## Assessment 1. Acute Recurrent Pancreatitis (ARP) with suspected gallstone etiology or congenital anomaly 2. Risk of dehydration due to decreased oral intake  and vomiting 3. Multiple comorbidities, including CVID, anxiety, hypertension, and others  ## Management Plan  ### Immediate Actions 1. Fluid resuscitation:    - Begin oral rehydration with clear liquids, advancing as tolerated    - Try Liquid IV or similar oral electrolyte solutions 2. Pain management:    - Use ketorolac (Toradol) 10 mg PO q6h PRN for pain 3. Antiemetic therapy:    - Consider ondansetron 4 mg PO q8h PRN for nausea/vomiting  ### Home Monitoring 1. Vital signs:    - Check BP, heart rate, and temperature every 4-6 hours    - Target BP: >110/70 mmHg 2. Urine output:    - Monitor and record frequency and volume    - Target: >0.5 mL/kg/hour  ### Dietary Management 1. Initial clear liquid diet for 24-48 hours 2. Advance to low-fat diet as tolerated (< 30% of total calories from fat) 3. Small, frequent meals (6-8 per day)  ### Medications 1. Continue current medications as prescribed 2. Add:    - Pancreatic enzyme replacement therapy (PERT): Consider starting with meals  - the evidence does not support this for acute pancreatitis without obvious exocrine insufficiency.    - Proton pump inhibitor (PPI): e.g., omeprazole 20 mg PO daily  ### Follow-up and Monitoring 1. Virtual check-in with PCP in 24-48 hours 2. Laboratory tests within 48-72 hours:    - CBC, CMP, lipase, triglycerides 3. Imaging:    - Schedule outpatient abdominal ultrasound within 1 week if not done recently  ### Indications for Emergency Department Visit 1. Inability to tolerate oral intake for >12 hours 2. Fever >38.0C (100.34F) 3. Worsening abdominal pain  despite medication 4. Signs of dehydration: dizziness, dark urine, decreased urine output 5. Persistent vomiting (>3 times in 24 hours) 6. Systolic BP <90 mmHg or heart rate >120 bpm  ### Long-term Management 1. Lifestyle modifications:    - Smoking cessation    - Alcohol abstinence    - Weight management (BMI goal <30) 2. Consider  referral to gastroenterology for further evaluation and management  ## Evidence-Based Guidelines and Recommendations  1. American College of Gastroenterology [facility]) Guidelines for Management of Acute Pancreatitis (2013):    - Early fluid resuscitation    - Pain management    - Nutritional support    - Source: Tenner S, et al. Donella Stade. 2013;108(9):1400-1415.  2. American Gastroenterological Association (AGA) Guidelines on Initial Management of Acute Pancreatitis (2018):    - Early oral feeding as tolerated    - Judicious use of antibiotics    - Source: Crockett SD, et al. Gastroenterology. 2018;154(4):1096-1101.  3. International Association of Pancreatology (IAP)/American Pancreatic Association (APA) Guidelines for Management of Acute Pancreatitis (2013):    - Risk stratification    - Timing of interventions    - Source: Working Group IAP/APA Acute Pancreatitis Guidelines. Pancreatology. 2013;13(4 Suppl 2):e1-e15.  4. European Society of Gastrointestinal Endoscopy (ESGE) Guidelines for Management of Acute Pancreatitis (2018):    - Indications for ERCP    - Timing of cholecystectomy    - Source: Arvanitakis M, et al. Endoscopy. 2018;50(5):524-545.  ## Importance of ERCP Consideration  Endoscopic Retrograde Cholangiopancreatography (ERCP) is an important diagnostic and therapeutic tool in the management of gallstone-related acute recurrent pancreatitis. Here's why it should be considered:  1. Diagnostic value:    - Identifies biliary stones, sludge, or anatomical abnormalities    - Evaluates pancreatic duct anatomy and potential congenital anomalies  2. Therapeutic potential:    - Allows for stone extraction or lithotripsy    - Facilitates biliary drainage through sphincterotomy or stent placement  3. Prevention of recurrence:    - Clearance of biliary stones can prevent future episodes of pancreatitis  4. Timing considerations:    - Early ERCP (within 24-72 hours) is  recommended for patients with cholangitis or biliary obstruction    - In other cases, ERCP can be scheduled electively after the acute episode resolves  5. Risk-benefit analysis:    - ERCP carries risks (e.g., post-ERCP pancreatitis, bleeding, perforation)    - Benefits often outweigh risks in recurrent gallstone-related pancreatitis  6. Alternative to surgery:    - May be preferable in high-risk surgical candidates  According to the ESGE guidelines, ERCP is indicated in acute biliary pancreatitis with cholangitis, jaundice, or dilated common bile duct. For recurrent episodes without these features, elective ERCP may be considered based on individual risk factors and clinical presentation.  Discuss the potential need for ERCP with your gastroenterologist during your follow-up appointment. They will help determine if and when ERCP is appropriate based on your specific clinical situation and the results of your upcoming imaging studies.

## 2022-11-06 ENCOUNTER — Encounter: Payer: Self-pay | Admitting: Internal Medicine

## 2022-11-07 ENCOUNTER — Encounter: Payer: Self-pay | Admitting: Internal Medicine

## 2022-11-07 ENCOUNTER — Other Ambulatory Visit: Payer: Self-pay | Admitting: Internal Medicine

## 2022-11-07 DIAGNOSIS — I700 Atherosclerosis of aorta: Secondary | ICD-10-CM | POA: Insufficient documentation

## 2022-11-07 MED ORDER — DICYCLOMINE HCL 10 MG PO CAPS: 10.0000 mg | ORAL_CAPSULE | Freq: Three times a day (TID) | ORAL | 1 refills | Status: DC

## 2022-11-07 NOTE — Addendum Note (Signed)
Addended by: Lula Olszewski on: 11/07/2022 07:11 AM   Modules accepted: Orders

## 2022-11-11 ENCOUNTER — Ambulatory Visit: Admitting: Allergy and Immunology

## 2022-11-11 VITALS — BP 116/68 | HR 65 | Temp 98.4°F | Resp 18 | Ht 60.0 in | Wt 158.1 lb

## 2022-11-11 DIAGNOSIS — K219 Gastro-esophageal reflux disease without esophagitis: Secondary | ICD-10-CM

## 2022-11-11 DIAGNOSIS — D839 Common variable immunodeficiency, unspecified: Secondary | ICD-10-CM

## 2022-11-11 DIAGNOSIS — R110 Nausea: Secondary | ICD-10-CM

## 2022-11-11 DIAGNOSIS — R1013 Epigastric pain: Secondary | ICD-10-CM

## 2022-11-11 DIAGNOSIS — M3500 Sicca syndrome, unspecified: Secondary | ICD-10-CM

## 2022-11-11 DIAGNOSIS — F17210 Nicotine dependence, cigarettes, uncomplicated: Secondary | ICD-10-CM | POA: Diagnosis not present

## 2022-11-11 DIAGNOSIS — E785 Hyperlipidemia, unspecified: Secondary | ICD-10-CM

## 2022-11-11 DIAGNOSIS — K859 Acute pancreatitis without necrosis or infection, unspecified: Secondary | ICD-10-CM

## 2022-11-11 MED ORDER — ESOMEPRAZOLE MAGNESIUM 40 MG PO CPDR: 40.0000 mg | DELAYED_RELEASE_CAPSULE | Freq: Every morning | ORAL | 1 refills | Status: DC

## 2022-11-11 MED ORDER — FAMOTIDINE 40 MG PO TABS: 40.0000 mg | ORAL_TABLET | Freq: Every evening | ORAL | 1 refills | Status: DC

## 2022-11-11 MED ORDER — LORATADINE 10 MG PO TABS: 10.0000 mg | ORAL_TABLET | Freq: Two times a day (BID) | ORAL | 1 refills | Status: DC

## 2022-11-11 NOTE — Patient Instructions (Addendum)
  1. Continue Xembify infusions  2. Treat reflux/LPR:   A. Nexium 40 mg in AM  B. Famotidine 40 mg in PM  C. Evaluation of throat with [facility] - Dr. Irene Pap   3. If needed:   A. Nasal saline wash  B. Loratadine 10 mg - 1-2 tablet 1 time per day   C. Nystatin - 5 mls swish and swallow 2 times a day if thrush develops  D.  Biotene mouthwash  E.  Systane / Refresh eyedrops  4.  Return to clinic in 6 months or earlier if problem  5. Obtain fall flu vaccine  6. Minimize smoke exposure

## 2022-11-11 NOTE — Progress Notes (Unsigned)
[place] [facility] [place] [facility] [place] [facility] [place] [facility] [name]   Follow-up Note  Referring Provider: Lula Olszewski, MD Primary Provider: Lula Olszewski, MD Date of Office Visit: 11/11/2022  Subjective:   Pamela Waller (DOB: 1965/04/01) is a 57 y.o. female who [facility] in re-evaluation of the following:  HPI: Jazmin returns to this clinic in evaluation of CVID, reflux, tobacco smoking, sicca syndrome.  I last saw her in this clinic 06 May 2022.  She has done very well regarding recurrent infections and in fact has not required a systemic steroid or antibiotic for any type of airway issue although she occasionally does get some "sinus" and she gets some "bronchitis".  Her immunoglobulin infusions are going quite well with some local swelling but no associated systemic or constitutional symptoms.  She continues to smoke about 6 to 8 cigarettes/day.  She thought her reflux was under good control but about 2 to 3 weeks ago she developed very severe epigastric pain with vomiting and she had a CT scan of her abdomen which apparently was normal and blood tests which were normal and she had her omeprazole changed to Nexium and she may be a little bit better but she still needs to take Phenergan for nausea.  She is going to be seeing her GI doctor regarding this issue.  She still continues to have throat clearing and raspy voice and she never did see ENT as suggested during her last visit.  She is not sure if the aggressive therapy directed against LPR is working very well.  Allergies as of 11/11/2022       Reactions   Dilaudid [hydromorphone Hcl] Anaphylaxis   Morphine And Codeine Anaphylaxis   Codeine Other (See Comments)   Other Hives   Penicillins Hives, Other (See Comments)   Percocet [oxycodone-acetaminophen] Nausea Only   Prednisone    psychosis   Prozac [fluoxetine Hcl]    Lost muscle control, was unable to speak or comprehend,  slurred words, paranoia, not a true allergy, tolerates zoloft        Medication List    antiseptic oral rinse Liqd 15 mLs by Mouth Rinse route as needed for dry mouth.   cetirizine 10 MG tablet Commonly known as: ZYRTEC as needed.   cholecalciferol 25 MCG (1000 UNIT) tablet Commonly known as: VITAMIN D3 Take 1 tablet (1,000 Units total) by mouth daily.   clonazePAM 0.5 MG tablet Commonly known as: KlonoPIN Take 1 tablet by mouth 3 times a day as needed   cyanocobalamin 1000 MCG tablet Commonly known as: VITAMIN B12 Take 1,000 mcg by mouth daily.   dicyclomine 10 MG capsule Commonly known as: BENTYL TAKE 1 CAPSULE(10 MG) BY MOUTH FOUR TIMES DAILY BEFORE MEALS AND AT BEDTIME   diphenhydrAMINE 12.5 MG/5ML elixir Commonly known as: BENADRYL Take by mouth every 14 (fourteen) days. Takes for infusions.   EPINEPHrine 0.3 mg/0.3 mL Soaj injection Commonly known as: EPI-PEN Inject into the muscle.   esomeprazole 40 MG capsule Commonly known as: NexIUM Take 1 capsule (40 mg total) by mouth daily.   famotidine 40 MG tablet Commonly known as: PEPCID Take 1 tablet (40 mg total) by mouth at bedtime.   ketorolac 10 MG tablet Commonly known as: TORADOL Take 1 tablet (10 mg total) by mouth every 6 (six) hours as needed.   loratadine 10 MG tablet Commonly known as: Claritin Take 1 tablet (10 mg total) by mouth 2 (two) times  daily.   Pepcid Complete 10-800-165 MG chewable tablet Generic drug: famotidine-calcium carbonate-magnesium hydroxide Chew 1 tablet by mouth 2 (two) times daily as needed.   promethazine 25 MG tablet Commonly known as: PHENERGAN Take 1 tablet (25 mg total) by mouth every 8 (eight) hours as needed for nausea or vomiting.   propranolol ER 60 MG 24 hr capsule Commonly known as: INDERAL LA Take 1 capsule (60 mg total) by mouth daily.   rosuvastatin 20 MG tablet Commonly known as: CRESTOR Take 1 tablet (20 mg total) by mouth daily.   scopolamine 1  MG/3DAYS Commonly known as: TRANSDERM-SCOP Place 1 patch (1.5 mg total) onto the skin every 3 (three) days.   sertraline 100 MG tablet Commonly known as: Zoloft Take 2 tablets by mouth daily   Systane Complete 0.6 % Soln Generic drug: Propylene Glycol Apply 1 drop to eye 4 (four) times daily as needed.   Xembify 10 GM/50ML Soln Generic drug: Immune Globulin (Human)-klhw Inject 20 g into the skin every 14 (fourteen) days.    Past Medical History:  Diagnosis Date   Abnormal mammogram 05/17/2020   She had a is BRCA2 positive  She is hesitant to go forward with double mastectomy  The fibroadenoma in the chest wall  Plan is 83-month 3D mammo or MRI- mammo due in december     Allergy    Bereavement, uncomplicated 05/17/2020   will try decreasing Zoloft     Chronic sinusitis 12/23/2018   Degenerative disc disease, cervical 11/07/2021   MRI proven 3 bulging disks with nerve pinches most notably on the left side    Depression    Endometriosis    Fatty liver 11/07/2021   Gastritis 07/11/2016   Had EGD in 2022  Takes peppermint oil only avoids H2 RA and PPI seeing the long-term complications and other patients  Intermittent severe gastritis flares  Has been checked and negative for H. pylori     GERD (gastroesophageal reflux disease)    Hyperlipidemia    Hypertension    Hypogammaglobulinemia (HCC)    sub class 1 and 3 deficiency   Lumbar degenerative disc disease 11/07/2021   Palmar erythema 11/07/2021   Panic attacks    Raised TSH level 10/09/2016   Sialadenitis 02/09/2018   Sinusitis    Stress incontinence 05/17/2020   f/u after u/s     Upper back pain on left side 12/27/2019   Vitamin B12 deficiency 12/23/2018   Vitamin D deficiency 12/23/2018    Past Surgical History:  Procedure Laterality Date   ABDOMINAL HYSTERECTOMY     BLADDER SURGERY     BREAST LUMPECTOMY     CESAREAN SECTION     KNEE SURGERY     NASAL SEPTUM SURGERY     SINUSOTOMY      Review of systems  negative except as noted in HPI / PMHx or noted below:  Review of Systems  Constitutional: Negative.   HENT: Negative.    Eyes: Negative.   Respiratory: Negative.    Cardiovascular: Negative.   Gastrointestinal: Negative.   Genitourinary: Negative.   Musculoskeletal: Negative.   Skin: Negative.   Neurological: Negative.   Endo/Heme/Allergies: Negative.   Psychiatric/Behavioral: Negative.       Objective:   Vitals:   11/11/22 1346  BP: 116/68  Pulse: 65  Resp: 18  Temp: 98.4 F (36.9 C)  SpO2: 95%   Height: 5' (152.4 cm)  Weight: 158 lb 1.6 oz (71.7 kg)   Physical Exam Constitutional:  Appearance: She is not diaphoretic.     Comments: Raspy voice  HENT:     Head: Normocephalic.     Right Ear: Tympanic membrane, ear canal and external ear normal.     Left Ear: Tympanic membrane, ear canal and external ear normal.     Nose: Nose normal. No mucosal edema or rhinorrhea.     Mouth/Throat:     Pharynx: Uvula midline. No oropharyngeal exudate.  Eyes:     Conjunctiva/sclera: Conjunctivae normal.  Neck:     Thyroid: No thyromegaly.     Trachea: Trachea normal. No tracheal tenderness or tracheal deviation.  Cardiovascular:     Rate and Rhythm: Normal rate and regular rhythm.     Heart sounds: Normal heart sounds, S1 normal and S2 normal. No murmur heard. Pulmonary:     Effort: No respiratory distress.     Breath sounds: Normal breath sounds. No stridor. No wheezing or rales.  Lymphadenopathy:     Head:     Right side of head: No tonsillar adenopathy.     Left side of head: No tonsillar adenopathy.     Cervical: No cervical adenopathy.  Skin:    Findings: No erythema or rash.     Nails: There is no clubbing.  Neurological:     Mental Status: She is alert.     Diagnostics:   Results of blood tests obtained 02 May 2022 identifies IgG 998 Mg/DL.  Assessment and Plan:   1. CVID (common variable immunodeficiency) (HCC)   2. LPRD (laryngopharyngeal reflux  disease)   3. Light tobacco smoker <10 cigarettes per day   4. Sicca syndrome (HCC)   5. Hyperlipidemia LDL goal <100   6. Epigastric pain   7. Acute pancreatitis without infection or necrosis, unspecified pancreatitis type   8. Nausea    1. Continue Xembify infusions   2. Treat reflux/LPR:   A. Nexium 40 mg in AM  B. Famotidine 40 mg in PM  C. Evaluation of throat with [facility] - Dr. Irene Pap   3. If needed:   A. Nasal saline wash  B. Loratadine 10 mg - 1-2 tablet 1 time per day   C. Nystatin - 5 mls swish and swallow 2 times a day if thrush develops  D.  Biotene mouthwash  E.  Systane / Refresh eyedrops  4.  Return to clinic in 6 months or earlier if problem  5. Obtain fall flu vaccine  6. Minimize smoke exposure  Karilee appears to be doing well regarding her infectious disease history while using Xembify on a consistent basis and she will continue on this immunoglobulin fusion.  She still has an irritated throat and whether or not that is secondary to LPR or tobacco smoke exposure or other etiologies is not entirely clear and I recommend that she have evaluation of her throat with [facility].  She obviously has some form of GI pain issue that is still active even in the face of therapy with a proton pump inhibitor and H2 receptor blocker and she needs to visit with her GI doctor regarding further management of this issue.  I will see her back in this clinic in 6 months or earlier if there is a problem.  Laurette Schimke, MD Allergy / Immunology [facility]

## 2022-11-12 ENCOUNTER — Encounter: Payer: Self-pay | Admitting: Allergy and Immunology

## 2022-11-18 ENCOUNTER — Telehealth: Payer: Self-pay

## 2022-11-18 NOTE — Telephone Encounter (Signed)
Referral has been placed to her office. Patient informed via MyChart.

## 2022-11-18 NOTE — Progress Notes (Deleted)
No chief complaint on file.   HISTORY OF PRESENT ILLNESS:  11/18/22 ALL:  Pamela Waller is a 57 y.o. female here today for follow up for peripheral neuropathy and headaches. She was last seen by Pamela Waller 04/2022. She had recently started B12 injections and topiramate through [facility] and was advised to continue current therapy. She was referred to sleep med for concerns of possible sleep apnea. Appt with Pamela Waller 4/25 cancelled. Since,    HISTORY (copied from Pamela Waller previous note)  I saw patient back in 2022 as referred by Pamela Stanley Poulosfor migraines.  Past medical history B12 deficiency, Sjogren's, she sees Pamela. Maryclare Waller at [facility].  She had a history of chronic B12 deficiency which she has been instructed to treat by [place] but she had not continued with the plan.  She sent back to [place] because she wants to address pernicious anemia affecting her nerves, peripheral neuropathy, for pernicious anemia itself I would send back to Pamela. Jon Waller or GI however she does need B12 supplementation absolutely which is what we recommended at the time for life.  We scheduled her for a 72-month appointment which she canceled.   BUT since she was referred her physician has luckily tested b12, put her on B12 supplementation (injections) just recently and so we will have to wait and see how that helps her neuropathy before doing anything. Her pcp also just started her on Topamax and is going to slowly increase and see if that works for migraines. BUT also she needs a sleep study. She has morning headaches, excessive fatgue, weight gain, snores like crazy will send for sleep testing.    REVIEW OF SYSTEMS: Out of a complete 14 system review of symptoms, the patient complains only of the following symptoms, and all other reviewed systems are negative.   ALLERGIES: Allergies  Allergen Reactions   Dilaudid [Hydromorphone Hcl] Anaphylaxis   Morphine And Codeine Anaphylaxis   Codeine Other (See Comments)    Other Hives   Penicillins Hives and Other (See Comments)   Percocet [Oxycodone-Acetaminophen] Nausea Only   Prednisone     psychosis   Prozac [Fluoxetine Hcl]     Lost muscle control, was unable to speak or comprehend, slurred words, paranoia, not a true allergy, tolerates zoloft     HOME MEDICATIONS: Outpatient Medications Prior to Visit  Medication Sig Dispense Refill   antiseptic oral rinse (BIOTENE) LIQD 15 mLs by Mouth Rinse route as needed for dry mouth. 1000 mL 1   cetirizine (ZYRTEC) 10 MG tablet as needed.     cholecalciferol (VITAMIN D3) 25 MCG (1000 UNIT) tablet Take 1 tablet (1,000 Units total) by mouth daily. 90 tablet 3   clonazePAM (KLONOPIN) 0.5 MG tablet Take 1 tablet by mouth 3 times a day as needed 90 tablet 5   cyanocobalamin (VITAMIN B12) 1000 MCG tablet Take 1,000 mcg by mouth daily.     dicyclomine (BENTYL) 10 MG capsule TAKE 1 CAPSULE(10 MG) BY MOUTH FOUR TIMES DAILY BEFORE MEALS AND AT BEDTIME 270 capsule 3   diphenhydrAMINE (BENADRYL) 12.5 MG/5ML elixir Take by mouth every 14 (fourteen) days. Takes for infusions.     EPINEPHrine 0.3 mg/0.3 mL IJ SOAJ injection Inject into the muscle.     esomeprazole (NEXIUM) 40 MG capsule Take 1 capsule (40 mg total) by mouth every morning. 90 capsule 1   famotidine (PEPCID) 40 MG tablet Take 1 tablet (40 mg total) by mouth every evening. 90 tablet 1  famotidine-calcium carbonate-magnesium hydroxide (PEPCID COMPLETE) 10-800-165 MG chewable tablet Chew 1 tablet by mouth 2 (two) times daily as needed. 100 tablet 11   hydrochlorothiazide (HYDRODIURIL) 12.5 MG tablet Take 12.5 mg by mouth daily.     Immune Globulin, Human,-klhw (XEMBIFY) 10 GM/50ML SOLN Inject 20 g into the skin every 14 (fourteen) days. 40 mL 11   ketorolac (TORADOL) 10 MG tablet Take 1 tablet (10 mg total) by mouth every 6 (six) hours as needed. 20 tablet 0   loratadine (CLARITIN) 10 MG tablet Take 1 tablet (10 mg total) by mouth 2 (two) times daily. 180 tablet 1    promethazine (PHENERGAN) 25 MG tablet Take 1 tablet (25 mg total) by mouth every 8 (eight) hours as needed for nausea or vomiting. 20 tablet 0   propranolol ER (INDERAL LA) 60 MG 24 hr capsule Take 1 capsule (60 mg total) by mouth daily. 90 capsule 3   Propylene Glycol (SYSTANE COMPLETE) 0.6 % SOLN Apply 1 drop to eye 4 (four) times daily as needed. 45 mL 1   rosuvastatin (CRESTOR) 20 MG tablet Take 1 tablet (20 mg total) by mouth daily. 90 tablet 3   scopolamine (TRANSDERM-SCOP) 1 MG/3DAYS Place 1 patch (1.5 mg total) onto the skin every 3 (three) days. 10 patch 2   No facility-administered medications prior to visit.     PAST MEDICAL HISTORY: Past Medical History:  Diagnosis Date   Abnormal mammogram 05/17/2020   She had a is BRCA2 positive  She is hesitant to go forward with double mastectomy  The fibroadenoma in the chest wall  Plan is 60-month 3D mammo or MRI- mammo due in december     Allergy    Bereavement, uncomplicated 05/17/2020   will try decreasing Zoloft     Chronic sinusitis 12/23/2018   Degenerative disc disease, cervical 11/07/2021   MRI proven 3 bulging disks with nerve pinches most notably on the left side    Depression    Endometriosis    Fatty liver 11/07/2021   Gastritis 07/11/2016   Had EGD in 2022  Takes peppermint oil only avoids H2 RA and PPI seeing the long-term complications and other patients  Intermittent severe gastritis flares  Has been checked and negative for H. pylori     GERD (gastroesophageal reflux disease)    Hyperlipidemia    Hypertension    Hypogammaglobulinemia (HCC)    sub class 1 and 3 deficiency   Lumbar degenerative disc disease 11/07/2021   Palmar erythema 11/07/2021   Panic attacks    Raised TSH level 10/09/2016   Sialadenitis 02/09/2018   Sinusitis    Stress incontinence 05/17/2020   f/u after u/s     Upper back pain on left side 12/27/2019   Vitamin B12 deficiency 12/23/2018   Vitamin D deficiency 12/23/2018     PAST  SURGICAL HISTORY: Past Surgical History:  Procedure Laterality Date   ABDOMINAL HYSTERECTOMY     BLADDER SURGERY     BREAST LUMPECTOMY     CESAREAN SECTION     KNEE SURGERY     NASAL SEPTUM SURGERY     SINUSOTOMY       FAMILY HISTORY: Family History  Problem Relation Age of Onset   Asthma Mother    Arthritis Mother    Breast cancer Mother    Pulmonary fibrosis Father    Hyperlipidemia Father    Hypertension Father    Sleep apnea Father    Breast cancer Sister    Stomach cancer  Neg Hx    Colon cancer Neg Hx    Pancreatic cancer Neg Hx    Liver disease Neg Hx    Esophageal cancer Neg Hx      SOCIAL HISTORY: Social History   Socioeconomic History   Marital status: Significant Other    Spouse name: Not on file   Number of children: Not on file   Years of education: Not on file   Highest education level: Not on file  Occupational History   Not on file  Tobacco Use   Smoking status: Every Day    Current packs/day: 0.75    Average packs/day: 0.8 packs/day for 28.0 years (21.0 ttl pk-yrs)    Types: Cigarettes    Passive exposure: Current   Smokeless tobacco: Never   Tobacco comments:    Started cigarettes age 82 cut down to half pk age 78. Per Pamela. Lucie Leather she she start vaping instead, started dec 2023 and vapes 4 times a day  Vaping Use   Vaping status: Not on file  Substance and Sexual Activity   Alcohol use: Not Currently   Drug use: Never   Sexual activity: Not on file  Other Topics Concern   Not on file  Social History Narrative   Not on file   Social Determinants of Health   Financial Resource Strain: Not on file  Food Insecurity: Not on file  Transportation Needs: Not on file  Physical Activity: Not on file  Stress: Not on file  Social Connections: Unknown (06/06/2021)   Received from [facility]   Social Network    Social Network: Not on file  Intimate Partner Violence: Unknown (06/06/2021)   [facility]   HITS    Physically Hurt: Not on file    Insult or Talk Down To: Not on file    Threaten Physical Harm: Not on file    Scream or Curse: Not on file     PHYSICAL EXAM  There were no vitals filed for this visit. There is no height or weight on file to calculate BMI.  Generalized: Well developed, in no acute distress  Cardiology: normal rate and rhythm, no murmur auscultated  Respiratory: clear to auscultation bilaterally    Neurological examination  Mentation: Alert oriented to time, place, history taking. Follows all commands speech and language fluent Cranial nerve II-XII: Pupils were equal round reactive to light. Extraocular movements were full, visual field were full on confrontational test. Facial sensation and strength were normal. Uvula tongue midline. Head turning and shoulder shrug  were normal and symmetric. Motor: The motor testing reveals 5 over 5 strength of all 4 extremities. Good symmetric motor tone is noted throughout.  Sensory: Sensory testing is intact to soft touch on all 4 extremities. No evidence of extinction is noted.  Coordination: Cerebellar testing reveals good finger-nose-finger and heel-to-shin bilaterally.  Gait and station: Gait is normal. Tandem gait is normal. Romberg is negative. No drift is seen.  Reflexes: Deep tendon reflexes are symmetric and normal bilaterally.    DIAGNOSTIC DATA (LABS, IMAGING, TESTING) - I reviewed patient records, labs, notes, testing and imaging myself where available.  Lab Results  Component Value Date   WBC 6.6 11/05/2022   HGB 14.6 11/05/2022   HCT 44.7 11/05/2022   MCV 90.0 11/05/2022   PLT 113.0 (L) 11/05/2022      Component Value Date/Time   NA 140 11/05/2022 1046   K 3.7 11/05/2022 1046   CL  104 11/05/2022 1046   CO2 29 11/05/2022 1046   GLUCOSE 83 11/05/2022 1046   BUN 15 11/05/2022 1046   CREATININE 0.79 11/05/2022 1046   CREATININE 0.77 11/07/2021 1130   CALCIUM 10.2 11/05/2022 1046   PROT  7.7 11/05/2022 1046   ALBUMIN 4.6 11/05/2022 1046   AST 19 11/05/2022 1046   ALT 11 11/05/2022 1046   ALKPHOS 65 11/05/2022 1046   BILITOT 0.6 11/05/2022 1046   GFRNONAA >60 12/02/2021 1208   Lab Results  Component Value Date   CHOL 175 08/26/2022   HDL 55.50 08/26/2022   LDLCALC 94 08/26/2022   TRIG 128.0 08/26/2022   CHOLHDL 3 08/26/2022   Lab Results  Component Value Date   HGBA1C 5.4 08/26/2022   Lab Results  Component Value Date   VITAMINB12 502 08/26/2022   Lab Results  Component Value Date   TSH 2.89 05/02/2022        No data to display               No data to display           ASSESSMENT AND PLAN  57 y.o. year old female  has a past medical history of Abnormal mammogram (05/17/2020), Allergy, Bereavement, uncomplicated (05/17/2020), Chronic sinusitis (12/23/2018), Degenerative disc disease, cervical (11/07/2021), Depression, Endometriosis, Fatty liver (11/07/2021), Gastritis (07/11/2016), GERD (gastroesophageal reflux disease), Hyperlipidemia, Hypertension, Hypogammaglobulinemia (HCC), Lumbar degenerative disc disease (11/07/2021), Palmar erythema (11/07/2021), Panic attacks, Raised TSH level (10/09/2016), Sialadenitis (02/09/2018), Sinusitis, Stress incontinence (05/17/2020), Upper back pain on left side (12/27/2019), Vitamin B12 deficiency (12/23/2018), and Vitamin D deficiency (12/23/2018). here with    No diagnosis found.  Everardo Pacific ***.  Healthy lifestyle habits encouraged. *** will follow up with PCP as directed. *** will return to see me in ***, sooner if needed. *** verbalizes understanding and agreement with this plan.   No orders of the defined types were placed in this encounter.    No orders of the defined types were placed in this encounter.    Shawnie Dapper, MSN, FNP-C 11/18/2022, 4:16 PM  [facility] [address] [place], [place] [address] [phone]

## 2022-11-18 NOTE — Telephone Encounter (Signed)
-----   Message from [facility] [place] [facility] sent at 11/12/2022 11:00 AM EDT -----  ----- Message ----- From: Jessica Priest, MD Sent: 11/12/2022   6:26 AM EDT To: [facility]  Evaluation of throat with [facility] - Dr. Irene Pap

## 2022-11-20 ENCOUNTER — Encounter: Payer: Self-pay | Admitting: Family Medicine

## 2022-11-20 ENCOUNTER — Ambulatory Visit: Admitting: Family Medicine

## 2022-11-20 DIAGNOSIS — G43009 Migraine without aura, not intractable, without status migrainosus: Secondary | ICD-10-CM

## 2022-11-21 ENCOUNTER — Telehealth: Payer: Self-pay

## 2022-11-21 ENCOUNTER — Encounter (INDEPENDENT_AMBULATORY_CARE_PROVIDER_SITE_OTHER): Payer: Self-pay | Admitting: Otolaryngology

## 2022-11-21 NOTE — Telephone Encounter (Signed)
-----   Message from [facility] [place] [facility] sent at 11/12/2022 11:00 AM EDT -----  ----- Message ----- From: Jessica Priest, MD Sent: 11/12/2022   6:26 AM EDT To: [facility]  Evaluation of throat with [facility] - Dr. Irene Pap

## 2022-12-05 ENCOUNTER — Institutional Professional Consult (permissible substitution) (INDEPENDENT_AMBULATORY_CARE_PROVIDER_SITE_OTHER): Admitting: Otolaryngology

## 2023-01-09 ENCOUNTER — Institutional Professional Consult (permissible substitution) (INDEPENDENT_AMBULATORY_CARE_PROVIDER_SITE_OTHER): Admitting: Otolaryngology

## 2023-04-06 ENCOUNTER — Telehealth: Payer: Self-pay | Admitting: *Deleted

## 2023-04-06 NOTE — Telephone Encounter (Signed)
 Called and l/m for patient that due to call from [facility] regarding her Xembify and repeat infections we should move her appt upt to have DR Kozlow readdress her dosing

## 2023-04-14 ENCOUNTER — Ambulatory Visit (INDEPENDENT_AMBULATORY_CARE_PROVIDER_SITE_OTHER): Admitting: Allergy and Immunology

## 2023-04-14 VITALS — BP 112/78 | HR 85 | Temp 98.7°F | Resp 14 | Ht <= 58 in | Wt 160.8 lb

## 2023-04-14 DIAGNOSIS — D839 Common variable immunodeficiency, unspecified: Secondary | ICD-10-CM | POA: Diagnosis not present

## 2023-04-14 DIAGNOSIS — R1013 Epigastric pain: Secondary | ICD-10-CM

## 2023-04-14 DIAGNOSIS — K859 Acute pancreatitis without necrosis or infection, unspecified: Secondary | ICD-10-CM

## 2023-04-14 DIAGNOSIS — K219 Gastro-esophageal reflux disease without esophagitis: Secondary | ICD-10-CM | POA: Diagnosis not present

## 2023-04-14 DIAGNOSIS — R110 Nausea: Secondary | ICD-10-CM

## 2023-04-14 DIAGNOSIS — M3500 Sicca syndrome, unspecified: Secondary | ICD-10-CM | POA: Diagnosis not present

## 2023-04-14 DIAGNOSIS — E785 Hyperlipidemia, unspecified: Secondary | ICD-10-CM

## 2023-04-14 DIAGNOSIS — F17210 Nicotine dependence, cigarettes, uncomplicated: Secondary | ICD-10-CM | POA: Diagnosis not present

## 2023-04-14 MED ORDER — FAMOTIDINE 40 MG PO TABS: 40.0000 mg | ORAL_TABLET | Freq: Every evening | ORAL | 1 refills | Status: AC

## 2023-04-14 MED ORDER — LORATADINE 10 MG PO TABS: 10.0000 mg | ORAL_TABLET | Freq: Two times a day (BID) | ORAL | 1 refills | Status: DC

## 2023-04-14 MED ORDER — ESOMEPRAZOLE MAGNESIUM 40 MG PO CPDR: 40.0000 mg | DELAYED_RELEASE_CAPSULE | Freq: Every morning | ORAL | 1 refills | Status: AC

## 2023-04-14 MED ORDER — SYSTANE COMPLETE 0.6 % OP SOLN: 1.0000 [drp] | Freq: Four times a day (QID) | OPHTHALMIC | 1 refills | Status: AC | PRN

## 2023-04-14 MED ORDER — BIOTENE DRY MOUTH MT LIQD: 15.0000 mL | OROMUCOSAL | 1 refills | Status: AC | PRN

## 2023-04-14 NOTE — Progress Notes (Unsigned)
 [place] [facility] [place] [facility] [place] [facility] [place] [facility] [name]   Follow-up Note  Referring Provider: Lula Olszewski, MD Primary Provider: Havery Moros, MD Date of Office Visit: 04/14/2023  Subjective:   Pamela Waller (DOB: Feb 26, 1965) is a 58 y.o. female who [facility] in re-evaluation of the following:  HPI: Pamela Waller returns to this clinic in evaluation of CVID, reflux, tobacco smoking, sicca syndrome, and LPR.  I last saw her in this clinic 11 November 2022.    She continues on immunoglobulin infusions via subcutaneous infusion every 2 weeks.  She would like to change to every week.  She has not had any side effect from utilizing these infusions.  She has not really had an infection since have last seen in this clinic other than contracting flu last week with a temperature of 103 but for the most part that issue is also resolved.  She does note this pressure headache that is behind her eyes and in her forehead which has been a pretty consistent issue.  There is not really any associated nasal symptoms.  She must have had that issue for many months if not the bulk of last year and this year.  She thinks that her reflux is under very good control on her current plan at this point.  When she was last seen in this clinic she was having some issues with her throat and we suggested that she see ENT but she did not do so.  Fortunately a lot of that throat issue has resolved.  She will occasionally use some nystatin for thrush.  She is having a sleep study in April.  She had evaluation with cardiology for bradycardia and had her propranolol removed and replaced with amlodipine.  Allergies as of 04/14/2023       Reactions   Dilaudid [hydromorphone Hcl] Anaphylaxis   Morphine And Codeine Anaphylaxis   Codeine Nausea And Vomiting   Headaches   Penicillins Hives   Prozac [fluoxetine Hcl] Other (See Comments)   Lost muscle control, was unable  to speak or comprehend, slurred words, paranoia, not a true allergy, tolerates zoloft   Percocet [oxycodone-acetaminophen] Nausea Only   Prednisone Other (See Comments)   psychosis        Medication List    amLODipine 5 MG tablet Commonly known as: NORVASC Take 5 mg by mouth daily.   antiseptic oral rinse Liqd 15 mLs by Mouth Rinse route as needed for dry mouth.   cetirizine 10 MG tablet Commonly known as: ZYRTEC as needed.   cholecalciferol 25 MCG (1000 UNIT) tablet Commonly known as: VITAMIN D3 Take 1 tablet (1,000 Units total) by mouth daily.   clonazePAM 0.5 MG tablet Commonly known as: KlonoPIN Take 1 tablet by mouth 3 times a day as needed   cyanocobalamin 1000 MCG tablet Commonly known as: VITAMIN B12 Take 1,000 mcg by mouth daily.   diphenhydrAMINE 12.5 MG/5ML elixir Commonly known as: BENADRYL Take by mouth every 14 (fourteen) days. Takes for infusions.   EPINEPHrine 0.3 mg/0.3 mL Soaj injection Commonly known as: EPI-PEN Inject into the muscle.   esomeprazole 40 MG capsule Commonly known as: NexIUM Take 1 capsule (40 mg total) by mouth every morning.   famotidine 40 MG tablet Commonly known as: PEPCID Take 1 tablet (40 mg total) by mouth every evening.   hydrochlorothiazide 12.5 MG tablet Commonly known as: HYDRODIURIL Take 12.5 mg by mouth daily.   ketorolac 10 MG  tablet Commonly known as: TORADOL Take 1 tablet (10 mg total) by mouth every 6 (six) hours as needed.   loratadine 10 MG tablet Commonly known as: Claritin Take 1 tablet (10 mg total) by mouth 2 (two) times daily.   meclizine 25 MG tablet Commonly known as: ANTIVERT Take 25 mg by mouth 3 (three) times daily as needed.   rosuvastatin 20 MG tablet Commonly known as: CRESTOR Take 1 tablet (20 mg total) by mouth daily.   sertraline 100 MG tablet Commonly known as: ZOLOFT Take 200 mg by mouth daily.   Systane Complete 0.6 % Soln Generic drug: Propylene Glycol Apply 1 drop to  eye 4 (four) times daily as needed.    Past Medical History:  Diagnosis Date   Abnormal mammogram 05/17/2020   She had a is BRCA2 positive  She is hesitant to go forward with double mastectomy  The fibroadenoma in the chest wall  Plan is 43-month 3D mammo or MRI- mammo due in december     Allergy    Bereavement, uncomplicated 05/17/2020   will try decreasing Zoloft     Chronic sinusitis 12/23/2018   Degenerative disc disease, cervical 11/07/2021   MRI proven 3 bulging disks with nerve pinches most notably on the left side    Depression    Endometriosis    Fatty liver 11/07/2021   Gastritis 07/11/2016   Had EGD in 2022  Takes peppermint oil only avoids H2 RA and PPI seeing the long-term complications and other patients  Intermittent severe gastritis flares  Has been checked and negative for H. pylori     GERD (gastroesophageal reflux disease)    Hyperlipidemia    Hypertension    Hypogammaglobulinemia (HCC)    sub class 1 and 3 deficiency   Lumbar degenerative disc disease 11/07/2021   Palmar erythema 11/07/2021   Panic attacks    Raised TSH level 10/09/2016   Sialadenitis 02/09/2018   Sinusitis    Stress incontinence 05/17/2020   f/u after u/s     Upper back pain on left side 12/27/2019   Vitamin B12 deficiency 12/23/2018   Vitamin D deficiency 12/23/2018    Past Surgical History:  Procedure Laterality Date   ABDOMINAL HYSTERECTOMY     BLADDER SURGERY     BREAST LUMPECTOMY     CESAREAN SECTION     KNEE SURGERY     NASAL SEPTUM SURGERY     SINUSOTOMY      Review of systems negative except as noted in HPI / PMHx or noted below:  Review of Systems  Constitutional: Negative.   HENT: Negative.    Eyes: Negative.   Respiratory: Negative.    Cardiovascular: Negative.   Gastrointestinal: Negative.   Genitourinary: Negative.   Musculoskeletal: Negative.   Skin: Negative.   Neurological: Negative.   Endo/Heme/Allergies: Negative.   Psychiatric/Behavioral: Negative.        Objective:   Vitals:   04/14/23 1127  BP: 112/78  Pulse: 85  Resp: 14  Temp: 98.7 F (37.1 C)  SpO2: 95%   Height: 4\' 10"  (147.3 cm)  Weight: 160 lb 12.8 oz (72.9 kg)   Physical Exam Constitutional:      Appearance: She is not diaphoretic.  HENT:     Head: Normocephalic.     Right Ear: Tympanic membrane, ear canal and external ear normal.     Left Ear: Tympanic membrane, ear canal and external ear normal.     Nose: Nose normal. No mucosal edema or rhinorrhea.  Mouth/Throat:     Pharynx: Uvula midline. No oropharyngeal exudate.  Eyes:     Conjunctiva/sclera: Conjunctivae normal.  Neck:     Thyroid: No thyromegaly.     Trachea: Trachea normal. No tracheal tenderness or tracheal deviation.  Cardiovascular:     Rate and Rhythm: Normal rate and regular rhythm.     Heart sounds: Normal heart sounds, S1 normal and S2 normal. No murmur heard. Pulmonary:     Effort: No respiratory distress.     Breath sounds: Normal breath sounds. No stridor. No wheezing or rales.  Lymphadenopathy:     Head:     Right side of head: No tonsillar adenopathy.     Left side of head: No tonsillar adenopathy.     Cervical: No cervical adenopathy.  Skin:    Findings: No erythema or rash.     Nails: There is no clubbing.  Neurological:     Mental Status: She is alert.     Diagnostics: Results of blood tests obtained 02 May 2022 identifies IgG 988 Mg/DL  Assessment and Plan:   1. CVID (common variable immunodeficiency) (HCC)   2. LPRD (laryngopharyngeal reflux disease)   3. Light tobacco smoker <10 cigarettes per day   4. Sicca syndrome (HCC)    1. Continue Xembify infusions  2. Treat reflux/LPR:   A. Nexium 40 mg in AM  B. Famotidine 40 mg in PM  3. If needed:   A. Nasal saline wash  B. Loratadine 10 mg - 1-2 tablet 1 time per day   C. Nystatin - 5 mls swish and swallow 1-2 times a day    D.  Biotene mouthwash  E.  Systane / Refresh eyedrops  4. Blood -  IgA/G/M  5. Will need to revisit with ENT if sinus issues progress  6.  Return to clinic in 6 months or earlier if problem  7. Minimize smoke exposure  Overall Pamela Waller is doing pretty well on her current therapy which includes subcutaneous immunoglobulin fusion and she really has not had that many infections.  And her reflux is under very good control on her current plan.  If she still continues with this pressure-like phenomena involving her head she can start evaluation with ENT and if that is negative then she can move onto neurology.  I will see her back in this clinic in 6 months or earlier if there is a problem.  Laurette Schimke, MD Allergy / Immunology [facility]

## 2023-04-14 NOTE — Patient Instructions (Addendum)
  1. Continue Xembify infusions  2. Treat reflux/LPR:   A. Nexium 40 mg in AM  B. Famotidine 40 mg in PM  3. If needed:   A. Nasal saline wash  B. Loratadine 10 mg - 1-2 tablet 1 time per day   C. Nystatin - 5 mls swish and swallow 1-2 times a day    D.  Biotene mouthwash  E.  Systane / Refresh eyedrops  4. Blood - IgA/G/M  5. Will need to revisit with ENT if sinus issues progress  6.  Return to clinic in 6 months or earlier if problem  6. Minimize smoke exposure

## 2023-04-15 ENCOUNTER — Encounter: Payer: Self-pay | Admitting: Allergy and Immunology

## 2023-04-15 LAB — IGG, IGA, IGM
IgA/Immunoglobulin A, Serum: 127 mg/dL (ref 87–352)
IgG (Immunoglobin G), Serum: 954 mg/dL (ref 586–1602)
IgM (Immunoglobulin M), Srm: 97 mg/dL (ref 26–217)

## 2023-04-16 ENCOUNTER — Encounter: Payer: Self-pay | Admitting: Allergy and Immunology

## 2023-05-07 ENCOUNTER — Other Ambulatory Visit (HOSPITAL_BASED_OUTPATIENT_CLINIC_OR_DEPARTMENT_OTHER): Payer: Self-pay | Admitting: Internal Medicine

## 2023-05-07 DIAGNOSIS — Z1231 Encounter for screening mammogram for malignant neoplasm of breast: Secondary | ICD-10-CM

## 2023-05-12 ENCOUNTER — Ambulatory Visit: Admitting: Allergy and Immunology

## 2023-05-15 ENCOUNTER — Ambulatory Visit (HOSPITAL_BASED_OUTPATIENT_CLINIC_OR_DEPARTMENT_OTHER)
Admission: RE | Admit: 2023-05-15 | Discharge: 2023-05-15 | Disposition: A | Discharge status: Home / Self Care | Source: Ambulatory Visit | Attending: Internal Medicine | Admitting: Internal Medicine

## 2023-05-15 ENCOUNTER — Encounter (HOSPITAL_BASED_OUTPATIENT_CLINIC_OR_DEPARTMENT_OTHER): Payer: Self-pay | Admitting: Radiology

## 2023-05-15 DIAGNOSIS — Z1231 Encounter for screening mammogram for malignant neoplasm of breast: Secondary | ICD-10-CM | POA: Diagnosis present

## 2023-09-07 ENCOUNTER — Telehealth: Payer: Self-pay | Admitting: *Deleted

## 2023-09-07 NOTE — Telephone Encounter (Signed)
 Per [facility] when they called patient to schedule delivery of there Xembify  patient advised she is going d/c therapy. I l/m for patient to contact me to discuss same or if she needs to come into clinic to discuss with Dr Kozlow

## 2023-09-25 NOTE — Patient Instructions (Addendum)
 Increase your water and fluid intake and stay well-hydrated.  You may take Tylenol or extra strength Tylenol if needed for pain.  Continue to use a heating pad on medium for 25 minutes off-and-on several times daily as needed to help with belly and back pain as well.  Try to follow a light, bland, low residue diet.  Stick with liquids such as soups, Jell-O, popsicles, pudding etc. and soft foods including scrambled eggs as well as mashed potatoes and yogurt etc. Avoid red meat and hard to digest foods such as raw vegetables seeds, nuts, popcorn.  Once you are better you may start increasing the fiber in your diet from fruits and vegetables as well as whole grains from breads and cereals or a daily fiber supplement such as Benefiber or chewable fiber Gummies.  Go immediately to the emergency department for worsening symptoms including increasing or severe abdominal and back pain, fevers, nausea and vomiting, and bloody or black stools.  Patient Education   Diverticulitis Discharge Instructions  About this topic  Sometimes, you may get small pouches or pockets in the walls of your large bowel. This is called diverticulosis. The pouches may become inflamed or infected. This is called diverticulitis. You are more likely to have this problem if you are between 60 and 53 years of age or if you have chronic constipation.   What care is needed at home?  Ask your doctor what you need to do when you go home. Make sure you ask questions if you do not understand what the doctor says. This way you will know what you need to do. Take your drugs as ordered by your doctor. Eat a balanced diet. Do not delay having a bowel movement. Go as soon as you have the urge. Do not strain or force your bowel movements. Drink 8 to 10 glasses of water each day. Talk to your doctor if you are drinking less fluids due to a health problem. What follow-up care is needed?  Your doctor may ask you to make visits to the office to check  on your progress. Be sure to keep these visits. What drugs may be needed?  The doctor may order drugs to: Help with pain and swelling Fight an infection Soften stools to help prevent straining with bowel movements Will physical activity be limited?  When you are in pain, you may need to rest in bed. To ease the pain, use a heat compress on your belly. This should last only for a few days. What changes to diet are needed?  Talk to your doctor about any changes you need to make to your diet. When the pouches become inflamed or infected, you may need to take in just liquids for a few days. This may help lessen your pain and help you heal. When you are feeling better, start to add more fiber to your diet. You do not need to avoid seeds, nuts, corn, or other similar foods. You will need to eat food rich in fiber and drink more water. Eat 5 or more servings of fresh fruits and vegetables every day. Eat 6 or more servings of whole-wheat grain breads and cereals. Try to get 25 to 30 grams of fiber every day. Read the labels to learn how much fiber is in foods. Do not drink beer, wine, and mixed drinks (alcohol). What problems could happen?  Pockets or pouches in your bowel may be infected or filled with pus. Hole or tear in your bowel Narrowing of  a part in your bowel Surgery may be needed to drain an infection or abscess If untreated, the colon may rupture and surgery may be needed. What can be done to prevent this health problem?  The best way to keep from having diverticulosis is to keep your bowel movements soft and normal. To keep more pouches from forming: Eat more whole grains, vegetables, and fruits. Try to get 25 to 30 grams of fiber each day. Read the labels to learn how much fiber is in foods. Drink more water. Drink ten 8 ounce (240 mL) glasses a day. Be active. Walk, garden, or do something active for 30 minutes or more on most days of the week. Talk with your doctor about adding an  over-the-counter (OTC) fiber product to keep your stools soft. Overuse of some pain drugs can cause hard stools; talk with your doctor. If you have a lot of pouches in your large intestine, surgery may be right for you. Talk to your doctor about this. When do I need to call the doctor?  Signs of infection. These include a fever of 100.32F (38C) or higher and chills. Upset stomach or very bad belly pain. Vomiting or being unable to eat, drink, or take your medications. Extreme fatigue, lightheadedness, dizziness, or passing out. Diarrhea or blood in your stool. Not having bowel movements or not passing any gas. Teach Back: Helping You Understand  The Teach Back Method helps you understand the information we are giving you. After you talk with the staff, tell them in your own words what you learned. This helps to make sure the staff has described each thing clearly. It also helps to explain things that may have been confusing. Before going home, make sure you can do these: I can tell you about my condition. I can tell you what changes I need to make with my diet or drugs. I can tell you what I will do if I have very bad belly pain or hard or bloody stools. Where can I learn more?  [employer] Academy [employer] https://familydoctor.org/condition/diverticular-disease/  [employer] Functional Gastrointestinal Disorders https://www.StorageDaily.gl.html  Last Reviewed Date  2019-05-03 Consumer Information Use and Disclaimer  This generalized information is a limited summary of diagnosis, treatment, and/or medication information. It is not meant to be comprehensive and should be used as a tool to help the user understand and/or assess potential diagnostic and treatment options. It does NOT include all information about conditions, treatments, medications, side effects, or risks that may apply to a specific patient. It is not  intended to be medical advice or a substitute for the medical advice, diagnosis, or treatment of a health care provider based on the health care provider's examination and assessment of a patient's specific and unique circumstances. Patients must speak with a health care provider for complete information about their health, medical questions, and treatment options, including any risks or benefits regarding use of medications. This information does not endorse any treatments or medications as safe, effective, or approved for treating a specific patient. [employer] and its affiliates disclaim any warranty or liability relating to this information or the use thereof. The use of this information is governed by the Terms of Use, available at https://www.wolterskluwer.com/en/know/clinical-effectiveness-terms  Copyright  Copyright  2023 [employer] and its affiliates and/or licensors. All rights reserved.

## 2023-10-05 NOTE — Telephone Encounter (Signed)
 I had called patient one more time but no response from her

## 2023-10-06 ENCOUNTER — Ambulatory Visit: Payer: Self-pay | Admitting: Allergy and Immunology

## 2023-10-20 ENCOUNTER — Ambulatory Visit (INDEPENDENT_AMBULATORY_CARE_PROVIDER_SITE_OTHER): Payer: Self-pay | Admitting: Allergy and Immunology

## 2023-10-20 ENCOUNTER — Other Ambulatory Visit: Payer: Self-pay

## 2023-10-20 ENCOUNTER — Encounter: Payer: Self-pay | Admitting: Allergy and Immunology

## 2023-10-20 VITALS — BP 120/80 | HR 96 | Temp 98.2°F | Ht 60.0 in | Wt 162.2 lb

## 2023-10-20 DIAGNOSIS — F17210 Nicotine dependence, cigarettes, uncomplicated: Secondary | ICD-10-CM | POA: Diagnosis not present

## 2023-10-20 DIAGNOSIS — D839 Common variable immunodeficiency, unspecified: Secondary | ICD-10-CM

## 2023-10-20 DIAGNOSIS — K219 Gastro-esophageal reflux disease without esophagitis: Secondary | ICD-10-CM | POA: Diagnosis not present

## 2023-10-20 DIAGNOSIS — M3500 Sicca syndrome, unspecified: Secondary | ICD-10-CM | POA: Diagnosis not present

## 2023-10-20 NOTE — Progress Notes (Unsigned)
 [place] [facility] [place] [facility] [place] - [place] - [place]   Follow-up Note  Referring Provider: Romaine Jodean RAMAN, MD Primary Provider: Romaine Jodean RAMAN, MD Date of Office Visit: 10/20/2023  Subjective:   Pamela Waller (DOB: 1965-09-28) is a 58 y.o. female [facility] in re-evaluation of the following:  HPI: Pamela Waller returns to this clinic in evaluation of CVID, reflux, tobacco smoking, sicca syndrome, LPR.  I last saw her in this clinic 14 April 2023. Pamela Waller has been having problems with her immunoglobulin infusions.  She has been getting small knots that seem to be somewhat persistent at the areas of her infusions.  She has been using her Xembify  for the past 3 years and she believes that the frequency of this nodular lesion development is increasing.  They are tender.  But, even though she has been having this problem with her infusions they are working well and she has not really had any infections and overall her airway is really doing very well and she has no issues with her upper airway and she is pretty happy with the way things are going at this point in time.  She has not had to use any treatment for thrush.  She has definitely been having some problems with reflux and this seems to be migrating up into her throat and giving her some intermittent raspy voice and throat clearing.  She had an upper endoscopy which identified erosive esophagitis and she was given pantoprazole  but unfortunately that gave rise to GI upset and now she is just using famotidine .  She is getting her flu vaccine tomorrow.  Allergies as of 10/20/2023       Reactions   Dilaudid [hydromorphone Hcl] Anaphylaxis   Morphine And Codeine Anaphylaxis   Codeine Nausea And Vomiting   Headaches   Penicillins Hives   Prozac [fluoxetine Hcl] Other (See Comments)   Lost muscle control, was unable to speak or comprehend, slurred words, paranoia, not a true allergy,  tolerates zoloft    Percocet [oxycodone-acetaminophen] Nausea Only   Prednisone Other (See Comments)   psychosis        Medication List    amLODipine 5 MG tablet Commonly known as: NORVASC Take 5 mg by mouth daily.   antiseptic oral rinse Liqd 15 mLs by Mouth Rinse route as needed for dry mouth.   cetirizine 10 MG tablet Commonly known as: ZYRTEC as needed.   cholecalciferol 25 MCG (1000 UNIT) tablet Commonly known as: VITAMIN D3 Take 1 tablet (1,000 Units total) by mouth daily.   clonazePAM  0.5 MG tablet Commonly known as: KlonoPIN  Take 1 tablet by mouth 3 times a day as needed   cyanocobalamin  1000 MCG tablet Commonly known as: VITAMIN B12 Take 1,000 mcg by mouth daily.   diphenhydrAMINE 12.5 MG/5ML elixir Commonly known as: BENADRYL Take by mouth every 14 (fourteen) days. Takes for infusions.   EPINEPHrine 0.3 mg/0.3 mL Soaj injection Commonly known as: EPI-PEN Inject into the muscle.   esomeprazole  40 MG capsule Commonly known as: NexIUM  Take 1 capsule (40 mg total) by mouth every morning.   famotidine  40 MG tablet Commonly known as: PEPCID  Take 1 tablet (40 mg total) by mouth every evening.   hydrochlorothiazide  12.5 MG tablet Commonly known as: HYDRODIURIL  Take 12.5 mg by mouth daily.   ketorolac  10 MG tablet Commonly known as: TORADOL  Take 1 tablet (10 mg total) by mouth every 6 (six) hours as needed.   loratadine   10 MG tablet Commonly known as: Claritin  Take 1 tablet (10 mg total) by mouth 2 (two) times daily.   meclizine  25 MG tablet Commonly known as: ANTIVERT  Take 25 mg by mouth 3 (three) times daily as needed.   rosuvastatin  20 MG tablet Commonly known as: CRESTOR  Take 1 tablet (20 mg total) by mouth daily.   sertraline  100 MG tablet Commonly known as: ZOLOFT  Take 200 mg by mouth daily.   Systane Complete 0.6 % Soln Generic drug: Propylene Glycol Apply 1 drop to eye 4 (four) times daily as needed.    Past Medical History:   Diagnosis Date   Abnormal mammogram 05/17/2020   She had a is BRCA2 positive  She is hesitant to go forward with double mastectomy  The fibroadenoma in the chest wall  Plan is 56-month 3D mammo or MRI- mammo due in december     Allergy    Bereavement, uncomplicated 05/17/2020   will try decreasing Zoloft      Chronic sinusitis 12/23/2018   Degenerative disc disease, cervical 11/07/2021   MRI proven 3 bulging disks with nerve pinches most notably on the left side    Depression    Endometriosis    Fatty liver 11/07/2021   Gastritis 07/11/2016   Had EGD in 2022  Takes peppermint oil only avoids H2 RA and PPI seeing the long-term complications and other patients  Intermittent severe gastritis flares  Has been checked and negative for H. pylori     GERD (gastroesophageal reflux disease)    Hyperlipidemia    Hypertension    Hypogammaglobulinemia    sub class 1 and 3 deficiency   Lumbar degenerative disc disease 11/07/2021   Palmar erythema 11/07/2021   Panic attacks    Raised TSH level 10/09/2016   Sialadenitis 02/09/2018   Sinusitis    Stress incontinence 05/17/2020   f/u after u/s     Upper back pain on left side 12/27/2019   Vitamin B12 deficiency 12/23/2018   Vitamin D  deficiency 12/23/2018    Past Surgical History:  Procedure Laterality Date   ABDOMINAL HYSTERECTOMY     ADENOIDECTOMY     BLADDER SURGERY     BREAST LUMPECTOMY     CESAREAN SECTION     KNEE SURGERY     NASAL SEPTUM SURGERY     SINUSOTOMY     TONSILLECTOMY      Review of systems negative except as noted in HPI / PMHx or noted below:  Review of Systems  Constitutional: Negative.   HENT: Negative.    Eyes: Negative.   Respiratory: Negative.    Cardiovascular: Negative.   Gastrointestinal: Negative.   Genitourinary: Negative.   Musculoskeletal: Negative.   Skin: Negative.   Neurological: Negative.   Endo/Heme/Allergies: Negative.   Psychiatric/Behavioral: Negative.       Objective:   Vitals:    10/20/23 1609  BP: 120/80  Pulse: 96  Temp: 98.2 F (36.8 C)  SpO2: 95%   Height: 5' (152.4 cm)  Weight: 162 lb 3.2 oz (73.6 kg)   Physical Exam Constitutional:      Appearance: She is not diaphoretic.     Comments: Slightly raspy voice, throat clearing  HENT:     Head: Normocephalic.     Right Ear: Tympanic membrane, ear canal and external ear normal.     Left Ear: Tympanic membrane, ear canal and external ear normal.     Nose: Nose normal. No mucosal edema or rhinorrhea.     Mouth/Throat:  Pharynx: Uvula midline. No oropharyngeal exudate.  Eyes:     Conjunctiva/sclera: Conjunctivae normal.  Neck:     Thyroid : No thyromegaly.     Trachea: Trachea normal. No tracheal tenderness or tracheal deviation.  Cardiovascular:     Rate and Rhythm: Normal rate and regular rhythm.     Heart sounds: Normal heart sounds, S1 normal and S2 normal. No murmur heard. Pulmonary:     Effort: No respiratory distress.     Breath sounds: Normal breath sounds. No stridor. No wheezing or rales.  Lymphadenopathy:     Head:     Right side of head: No tonsillar adenopathy.     Left side of head: No tonsillar adenopathy.     Cervical: No cervical adenopathy.  Skin:    Findings: No erythema or rash.     Nails: There is no clubbing.  Neurological:     Mental Status: She is alert.     Diagnostics: none  Assessment and Plan:   1. CVID (common variable immunodeficiency) (HCC)   2. LPRD (laryngopharyngeal reflux disease)   3. Light tobacco smoker <10 cigarettes per day   4. Sicca syndrome    1. Continue Subcutaneous immunoglobulin infusions - Tammy  2. Treat reflux/LPR:   A. INCREASE Nexium  40 mg - 2 times per day  B. Famotidine  40 mg in PM  C. Decrease caffeine and chocolate consumption  D. Decrease nicotine exposure  3. If needed:   A.  Nasal saline wash  B.  Loratadine  10 mg - 1-2 tablet 1 time per day   C.  Nystatin  - 5 mls swish and swallow 1-2 times a day    D.  Biotene  mouthwash  E.  Systane / Refresh eyedrops  4. Return to clinic in 6 months or earlier if problem  5. Influenza = Tamiflu. Covid = Paxlovid  6. Minimize smoke exposure  Pamela Waller will need to have a different form of subcutaneous immunoglobulin infusion provided as she is developing small nodular lesions with her Xembify  infusions and we will get that arranged sometime over the next week or 2.  And she has erosive esophagitis and it is affecting her throat and will increase her Nexium  to twice a day as she appears to be intolerant of using pantoprazole  which was attempted earlier this month and she will continue on famotidine  and I have encouraged her to decrease her caffeine and chocolate consumption and of course stop smoking.  She does well with the plan noted above we will see her back in this clinic in 6 months or earlier if there is a problem.  Camellia Denis, MD Allergy / Immunology [facility]

## 2023-10-20 NOTE — Patient Instructions (Addendum)
  1. Continue Subcutaneous immunoglobulin infusions - Tammy  2. Treat reflux/LPR:   A. INCREASE Nexium  40 mg - 2 times per day  B. Famotidine  40 mg in PM  C. Decrease caffeine and chocolate consumption  D. Decrease nicotine exposure  3. If needed:   A.  Nasal saline wash  B.  Loratadine  10 mg - 1-2 tablet 1 time per day   C.  Nystatin  - 5 mls swish and swallow 1-2 times a day    D.  Biotene mouthwash  E.  Systane / Refresh eyedrops  4. Return to clinic in 6 months or earlier if problem  5. Influenza = Tamiflu. Covid = Paxlovid  6. Minimize smoke exposure

## 2023-10-21 ENCOUNTER — Encounter: Payer: Self-pay | Admitting: Allergy and Immunology

## 2023-10-22 ENCOUNTER — Telehealth: Payer: Self-pay | Admitting: *Deleted

## 2023-10-22 NOTE — Telephone Encounter (Signed)
 Spoke to patient and advised verbal given to Realo for change in therapy to Cutaquig. Advised if she has any problems to let me know

## 2023-10-22 NOTE — Telephone Encounter (Signed)
 L/m for patient to contact me to advised change to Cutaquig 20 grams every 14 days per [employer] step therapy requirement

## 2023-10-22 NOTE — Telephone Encounter (Signed)
-----   Message from ERIC J KOZLOW sent at 10/21/2023  6:35 AM EDT ----- Change SQIG to new product.  Hizentra?

## 2023-10-26 ENCOUNTER — Other Ambulatory Visit: Payer: Self-pay | Admitting: Allergy and Immunology

## 2023-10-26 ENCOUNTER — Encounter: Payer: Self-pay | Admitting: Allergy and Immunology

## 2023-10-26 DIAGNOSIS — J328 Other chronic sinusitis: Secondary | ICD-10-CM

## 2023-10-26 DIAGNOSIS — D839 Common variable immunodeficiency, unspecified: Secondary | ICD-10-CM

## 2023-12-18 ENCOUNTER — Encounter: Payer: Self-pay | Admitting: Allergy and Immunology

## 2023-12-28 ENCOUNTER — Encounter: Payer: Self-pay | Admitting: Allergy and Immunology

## 2023-12-28 NOTE — Telephone Encounter (Signed)
 Sent message to patient approved for Hizentra and verbal to Realo for same

## 2023-12-31 ENCOUNTER — Telehealth: Payer: Self-pay | Admitting: *Deleted

## 2023-12-31 NOTE — Telephone Encounter (Signed)
 Spoke to patient will try the Hizentra starting with every two weeks and if issues go to weekly. If still having issues she has appt in Jan can discuss IVIG

## 2023-12-31 NOTE — Telephone Encounter (Signed)
 Patient to try Hizentra every 14 days

## 2024-04-19 ENCOUNTER — Ambulatory Visit: Admitting: Allergy and Immunology
